# Patient Record
Sex: Female | Born: 1975 | Race: White | Hispanic: No | Marital: Married | Smoking: Never smoker
Health system: Southern US, Community
[De-identification: ages and names within clinical notes are randomized; demographics above are authoritative.]

## PROBLEM LIST (undated history)

## (undated) ENCOUNTER — Emergency Department (HOSPITAL_BASED_OUTPATIENT_CLINIC_OR_DEPARTMENT_OTHER): Admission: EM | Discharge status: Absent without leave | Payer: Self-pay

## (undated) DIAGNOSIS — N809 Endometriosis, unspecified: Secondary | ICD-10-CM

## (undated) DIAGNOSIS — Z87440 Personal history of urinary (tract) infections: Secondary | ICD-10-CM

## (undated) DIAGNOSIS — E039 Hypothyroidism, unspecified: Secondary | ICD-10-CM

## (undated) DIAGNOSIS — Z8619 Personal history of other infectious and parasitic diseases: Secondary | ICD-10-CM

## (undated) DIAGNOSIS — N926 Irregular menstruation, unspecified: Secondary | ICD-10-CM

## (undated) DIAGNOSIS — R102 Pelvic and perineal pain: Secondary | ICD-10-CM

## (undated) DIAGNOSIS — J45909 Unspecified asthma, uncomplicated: Secondary | ICD-10-CM

## (undated) DIAGNOSIS — N930 Postcoital and contact bleeding: Secondary | ICD-10-CM

## (undated) DIAGNOSIS — Z8742 Personal history of other diseases of the female genital tract: Secondary | ICD-10-CM

## (undated) DIAGNOSIS — IMO0002 Reserved for concepts with insufficient information to code with codable children: Secondary | ICD-10-CM

## (undated) DIAGNOSIS — R6882 Decreased libido: Secondary | ICD-10-CM

## (undated) DIAGNOSIS — N83202 Unspecified ovarian cyst, left side: Secondary | ICD-10-CM

## (undated) HISTORY — DX: Endometriosis, unspecified: N80.9

## (undated) HISTORY — DX: Unspecified ovarian cyst, left side: N83.202

## (undated) HISTORY — DX: Personal history of urinary (tract) infections: Z87.440

## (undated) HISTORY — DX: Pelvic and perineal pain: R10.2

## (undated) HISTORY — DX: Personal history of other diseases of the female genital tract: Z87.42

## (undated) HISTORY — PX: WISDOM TOOTH EXTRACTION: SHX21

## (undated) HISTORY — DX: Hypothyroidism, unspecified: E03.9

## (undated) HISTORY — DX: Unspecified asthma, uncomplicated: J45.909

## (undated) HISTORY — DX: Personal history of other infectious and parasitic diseases: Z86.19

## (undated) HISTORY — DX: Decreased libido: R68.82

## (undated) HISTORY — PX: OTHER SURGICAL HISTORY: SHX169

## (undated) HISTORY — PX: TUBAL LIGATION: SHX77

## (undated) HISTORY — DX: Postcoital and contact bleeding: N93.0

## (undated) HISTORY — PX: NOVASURE ABLATION: SHX5394

## (undated) HISTORY — PX: BREAST SURGERY: SHX581

## (undated) HISTORY — DX: Reserved for concepts with insufficient information to code with codable children: IMO0002

## (undated) HISTORY — DX: Irregular menstruation, unspecified: N92.6

---

## 1994-01-03 DIAGNOSIS — IMO0002 Reserved for concepts with insufficient information to code with codable children: Secondary | ICD-10-CM

## 1994-01-03 DIAGNOSIS — R87619 Unspecified abnormal cytological findings in specimens from cervix uteri: Secondary | ICD-10-CM

## 1994-01-03 HISTORY — DX: Reserved for concepts with insufficient information to code with codable children: IMO0002

## 1994-01-03 HISTORY — DX: Unspecified abnormal cytological findings in specimens from cervix uteri: R87.619

## 1998-09-11 ENCOUNTER — Inpatient Hospital Stay (HOSPITAL_COMMUNITY): Admission: AD | Admit: 1998-09-11 | Discharge: 1998-09-11 | Payer: Self-pay | Admitting: Obstetrics and Gynecology

## 1998-11-08 ENCOUNTER — Inpatient Hospital Stay (HOSPITAL_COMMUNITY): Admission: AD | Admit: 1998-11-08 | Discharge: 1998-11-08 | Payer: Self-pay | Admitting: Obstetrics and Gynecology

## 1998-11-17 ENCOUNTER — Inpatient Hospital Stay (HOSPITAL_COMMUNITY): Admission: AD | Admit: 1998-11-17 | Discharge: 1998-11-17 | Payer: Self-pay | Admitting: Obstetrics and Gynecology

## 1998-11-25 ENCOUNTER — Inpatient Hospital Stay (HOSPITAL_COMMUNITY): Admission: AD | Admit: 1998-11-25 | Discharge: 1998-11-27 | Payer: Self-pay | Admitting: Obstetrics and Gynecology

## 2000-01-04 DIAGNOSIS — IMO0002 Reserved for concepts with insufficient information to code with codable children: Secondary | ICD-10-CM

## 2000-01-04 DIAGNOSIS — R6882 Decreased libido: Secondary | ICD-10-CM

## 2000-01-04 DIAGNOSIS — Z8742 Personal history of other diseases of the female genital tract: Secondary | ICD-10-CM

## 2000-01-04 DIAGNOSIS — Z8619 Personal history of other infectious and parasitic diseases: Secondary | ICD-10-CM

## 2000-01-04 HISTORY — DX: Personal history of other infectious and parasitic diseases: Z86.19

## 2000-01-04 HISTORY — DX: Personal history of other diseases of the female genital tract: Z87.42

## 2000-01-04 HISTORY — DX: Decreased libido: R68.82

## 2000-01-04 HISTORY — DX: Reserved for concepts with insufficient information to code with codable children: IMO0002

## 2000-02-23 ENCOUNTER — Other Ambulatory Visit: Admission: RE | Admit: 2000-02-23 | Discharge: 2000-02-23 | Payer: Self-pay | Admitting: Obstetrics and Gynecology

## 2000-02-23 ENCOUNTER — Encounter (INDEPENDENT_AMBULATORY_CARE_PROVIDER_SITE_OTHER): Payer: Self-pay | Admitting: Specialist

## 2000-05-24 ENCOUNTER — Other Ambulatory Visit: Admission: RE | Admit: 2000-05-24 | Discharge: 2000-05-24 | Payer: Self-pay | Admitting: Obstetrics and Gynecology

## 2001-01-03 DIAGNOSIS — N930 Postcoital and contact bleeding: Secondary | ICD-10-CM

## 2001-01-03 DIAGNOSIS — Z8742 Personal history of other diseases of the female genital tract: Secondary | ICD-10-CM

## 2001-01-03 HISTORY — DX: Personal history of other diseases of the female genital tract: Z87.42

## 2001-01-03 HISTORY — DX: Postcoital and contact bleeding: N93.0

## 2001-05-24 ENCOUNTER — Other Ambulatory Visit: Admission: RE | Admit: 2001-05-24 | Discharge: 2001-05-24 | Payer: Self-pay | Admitting: Obstetrics and Gynecology

## 2001-11-09 ENCOUNTER — Encounter: Payer: Self-pay | Admitting: Obstetrics and Gynecology

## 2001-11-09 ENCOUNTER — Ambulatory Visit (HOSPITAL_COMMUNITY): Admission: RE | Admit: 2001-11-09 | Discharge: 2001-11-09 | Payer: Self-pay | Admitting: Obstetrics and Gynecology

## 2001-12-24 ENCOUNTER — Inpatient Hospital Stay (HOSPITAL_COMMUNITY): Admission: AD | Admit: 2001-12-24 | Discharge: 2001-12-24 | Payer: Self-pay | Admitting: Obstetrics and Gynecology

## 2001-12-25 ENCOUNTER — Encounter: Payer: Self-pay | Admitting: Obstetrics and Gynecology

## 2001-12-25 ENCOUNTER — Ambulatory Visit (HOSPITAL_COMMUNITY): Admission: RE | Admit: 2001-12-25 | Discharge: 2001-12-25 | Payer: Self-pay | Admitting: Obstetrics and Gynecology

## 2002-01-03 DIAGNOSIS — N926 Irregular menstruation, unspecified: Secondary | ICD-10-CM

## 2002-01-03 DIAGNOSIS — N809 Endometriosis, unspecified: Secondary | ICD-10-CM

## 2002-01-03 DIAGNOSIS — Z8742 Personal history of other diseases of the female genital tract: Secondary | ICD-10-CM

## 2002-01-03 HISTORY — DX: Irregular menstruation, unspecified: N92.6

## 2002-01-03 HISTORY — DX: Personal history of other diseases of the female genital tract: Z87.42

## 2002-01-03 HISTORY — DX: Endometriosis, unspecified: N80.9

## 2002-03-04 ENCOUNTER — Inpatient Hospital Stay (HOSPITAL_COMMUNITY): Admission: AD | Admit: 2002-03-04 | Discharge: 2002-03-04 | Payer: Self-pay | Admitting: Obstetrics and Gynecology

## 2002-03-14 ENCOUNTER — Inpatient Hospital Stay (HOSPITAL_COMMUNITY): Admission: AD | Admit: 2002-03-14 | Discharge: 2002-03-16 | Payer: Self-pay | Admitting: Obstetrics and Gynecology

## 2002-03-15 ENCOUNTER — Encounter (INDEPENDENT_AMBULATORY_CARE_PROVIDER_SITE_OTHER): Payer: Self-pay

## 2002-06-26 ENCOUNTER — Other Ambulatory Visit: Admission: RE | Admit: 2002-06-26 | Discharge: 2002-06-26 | Payer: Self-pay | Admitting: Obstetrics and Gynecology

## 2003-06-30 ENCOUNTER — Other Ambulatory Visit: Admission: RE | Admit: 2003-06-30 | Discharge: 2003-06-30 | Payer: Self-pay | Admitting: Obstetrics and Gynecology

## 2004-06-29 ENCOUNTER — Ambulatory Visit (HOSPITAL_COMMUNITY): Admission: RE | Admit: 2004-06-29 | Discharge: 2004-06-29 | Payer: Self-pay | Admitting: Obstetrics and Gynecology

## 2004-07-19 ENCOUNTER — Other Ambulatory Visit: Admission: RE | Admit: 2004-07-19 | Discharge: 2004-07-19 | Payer: Self-pay | Admitting: Obstetrics and Gynecology

## 2005-01-03 DIAGNOSIS — R102 Pelvic and perineal pain: Secondary | ICD-10-CM

## 2005-01-03 HISTORY — DX: Pelvic and perineal pain: R10.2

## 2005-05-01 ENCOUNTER — Encounter: Admission: RE | Admit: 2005-05-01 | Discharge: 2005-05-01 | Payer: Self-pay | Admitting: Orthopedic Surgery

## 2005-05-08 ENCOUNTER — Encounter: Admission: RE | Admit: 2005-05-08 | Discharge: 2005-05-08 | Payer: Self-pay | Admitting: Orthopedic Surgery

## 2005-10-18 ENCOUNTER — Other Ambulatory Visit: Admission: RE | Admit: 2005-10-18 | Discharge: 2005-10-18 | Payer: Self-pay | Admitting: Obstetrics and Gynecology

## 2006-01-03 DIAGNOSIS — N83202 Unspecified ovarian cyst, left side: Secondary | ICD-10-CM

## 2006-01-03 HISTORY — DX: Unspecified ovarian cyst, left side: N83.202

## 2006-05-04 ENCOUNTER — Encounter: Admission: RE | Admit: 2006-05-04 | Discharge: 2006-05-04 | Payer: Self-pay | Admitting: Orthopedic Surgery

## 2007-01-04 DIAGNOSIS — Z8742 Personal history of other diseases of the female genital tract: Secondary | ICD-10-CM

## 2007-01-04 HISTORY — DX: Personal history of other diseases of the female genital tract: Z87.42

## 2007-08-16 ENCOUNTER — Emergency Department (HOSPITAL_COMMUNITY): Admission: EM | Admit: 2007-08-16 | Discharge: 2007-08-17 | Payer: Self-pay | Admitting: Emergency Medicine

## 2007-08-23 ENCOUNTER — Emergency Department (HOSPITAL_COMMUNITY): Admission: EM | Admit: 2007-08-23 | Discharge: 2007-08-23 | Payer: Self-pay | Admitting: *Deleted

## 2010-05-21 NOTE — H&P (Signed)
NAME:  [name], [name]                         ACCOUNT NO.:  [id number]   MEDICAL RECORD NO.:  [id number]                   PATIENT TYPE:  INP   LOCATION:  9173                                 [facility]ILITY:  [facility]   PHYSICIAN:  Hal Morales, M.D.             DATE OF BIRTH:  1975/02/13   DATE OF ADMISSION:  03/14/2002  DATE OF DISCHARGE:                                HISTORY & PHYSICAL   HISTORY OF PRESENT ILLNESS:  The patient is a 35 year old, G3, P2-0-0-2 who  is admitted for induction secondary to suspected macrosomia.  The patient  reports estimated fetal weight approximately eight to nine pounds.  The  pregnancy has been remarkable for history of oligohydramnios last pregnancy,  asthma, questionable LMP, obesity, history of positive Group B Streptococcus  previous pregnancy and desires tubal sterilization.   PRENATAL LABORATORY DATA:  Blood type B positive, Rh antibody negative, VDRL  nonreactive, rubella titer positive, hepatitis B surface antigen negative,  HIV nonreactive.  Cystic fibrosis testing was negative.  Pap smear was  normal.  GC and Chlamydia cultures were negative.  Glucose challenge was  normal.  AFP was normal.  Hemoglobin upon entering the practice was 11.9.  It was 11 at 27 weeks.  Group B Streptococcus was negative at 36 weeks.  EDC  of March 14, 2002, was established by ultrasound at approximately 10 weeks  secondary to questionable LMP.   PRENATAL COURSE:  The patient entered care at approximately 10 weeks.  She  had a significant rash that developed over her trunk at approximately 11  weeks.  She was given steroids by her primary physician.  She did have some  weight loss in early pregnancy.  She had an ultrasound at 18 weeks that  showed normal growth and fluid.  Limited anatomy was noted.  Follow up  ultrasound was done at [facility] still with the same limitations of  anatomy.  She was referred to a dermatologist at approximately 18 weeks.  She  was seen by her primary and was evaluated for her asthma at  approximately 26 weeks.  She was evaluated at maternity admissions at 28  weeks for preterm labor.  Fetal fibronectin was negative.  She had an  ultrasound at approximately 34 weeks, again showing slight macrosomia.  She  had an ultrasound again at 36 weeks purely for position.  The rest of the  pregnancy was essentially uncomplicated other than the suspected macrosomia  with estimated fetal weight, per patient report, of eight to nine pounds.   PAST OBSTETRICAL HISTORY:  In 1998, she had a vaginal birth of a female  infant, weight 6 pounds 13 ounces at 40 weeks' gestation.  She was in labor  for 10 hours.  She had an epidural.  In 2000, she had a vaginal birth of a  female infant weight 7 pounds 5 ounces at 39 weeks.  She was in labor  for  eight hours with an epidural.  She had no problem with her labors.   PAST MEDICAL HISTORY:  1. History of endometriosis diagnosed on laparoscopic procedure for usual     childhood illnesses.  2. Asthma and uses albuterol p.r.n.   PAST SURGICAL HISTORY:  1. Wisdom teeth removed.  2. In 1991, she had right breast axillary tissue removed.  3. In 1997, laparoscopy noted endometriosis.   ALLERGIES:  SULFA which causes a rash.   FAMILY HISTORY:  Maternal grandfather had myocardial infarction.  Brother  has a heart murmur.  Father has hypertension.  Mother and maternal  grandfather have diabetes.  Maternal grandfather had chronic renal disease.  Maternal grandmother with arthritis.  Maternal great-grandmother had breast  and liver cancer.  Mother and maternal grandmother have depression.   GENETIC HISTORY:  Unremarkable except for the father of the baby's first  cousin being mentally retarded.   SOCIAL HISTORY:  The patient is married to the father of the baby.  He is  involved and supportive.  His name is Sharin Grave.  The patient is  Caucasian of the [place].  She has been  followed by the physician  service at [facility].  She denies any alcohol, drug or tobacco use  during this pregnancy.   PHYSICAL EXAMINATION:  VITAL SIGNS:  Stable.  The patient is afebrile.  HEENT:  Within normal limits.  LUNGS:  Breath sounds are clear.  HEART:  Regular rate and rhythm without murmur.  BREASTS:  Soft and nontender.  ABDOMEN:  Fundal height approximately 40 cm.  Vertex by Leopold's.  Fetal  heart rate is reassuring with no decelerations.  Now has accelerations with  very sporadic uterine contractions noted.  PELVIC:  Cervical exam per last office visit was 1 cm, 50%.  EXTREMITIES:  Within normal limits.   IMPRESSION:  1. Intrauterine pregnancy at term.  2. Suspected macrosomia.  3. Desires tubal sterilization.   PLAN:  1. Admit to birthing suite for consult with Dr. Pennie Rushing as attending     physician.  2. Routine physician orders.  3. Dr. Pennie Rushing will determine mode of induction.     Renaldo Reel Emilee Hero, C.N.M.                   Hal Morales, M.D.    VLL/MEDQ  D:  03/14/2002  T:  03/14/2002  Job:  [id number]

## 2010-05-21 NOTE — Op Note (Signed)
[name], [name]               ACCOUNT NO.:  [id number]   MEDICAL RECORD NO.:  [id number]          PATIENT TYPE:  AMB   LOCATION:  SDC                           [facility]  [facility]   PHYSICIAN:  Hal Morales, M.D.DATE OF BIRTH:  [birth year]   DATE OF PROCEDURE:  06/29/2004  DATE OF DISCHARGE:                                 OPERATIVE REPORT   PREOPERATIVE DIAGNOSIS:  Menorrhagia.   POSTOPERATIVE DIAGNOSIS:  Menorrhagia.   OPERATION:  NovaSure endometrial ablation.   SURGEON:  Hal Morales, M.D.   ANESTHESIA:  General.   ESTIMATED BLOOD LOSS:  Less than 10 mL.   COMPLICATIONS:  None.   FINDINGS:  The uterus sounded to a total of 9 cm with the cervix sounding to  3 cm. This left the cavity length of 6 cm.   PROCEDURE:  The patient was taken to the operating room after appropriate  identification and placed on the operating table. After the attainment of  adequate general anesthesia she was placed in modified lithotomy position.  The perineum was prepped with multiple layers of Betadine as was the vagina.  The patient had gone to empty her bladder just prior to coming to the  operating room. The perineum was draped as a sterile field. A Graves  speculum was placed in the vagina after an examination under anesthesia. A  single-tooth tenaculum was placed on the anterior cervix. The cervical  length was measured at 3 cm. The total uterine length was measured at 9 cm.  The NovaSure apparatus was then placed at the 6 cm depth and the cervix  dilated to a #23 dilator. The NovaSure apparatus was then placed into the  endometrial cavity and seated with an increase in the cavity width from 2cm  to total of 4 cm after seating was complete. The collar was then moved to  allow a  seal on the cervix and the integrity test performed. After passing  the integrity test, the NovaSure ablation was begun and took a total of 34  seconds with a power of 132. Once the ablation was complete  the array was  replaced within the NovaSure apparatus and the entire apparatus was removed  from the endometrial cavity. The tenaculum was removed and a Nyman nitrate  stick used to cauterize an area on the cervix which was bleeding from the  tenaculum. All instruments were removed from the vagina and the patient was  awakened from general anesthesia, taken to the recovery room in satisfactory  condition having tolerated the procedure well with sponge and instrument  counts correct.     VPH/MEDQ  D:  06/29/2004  T:  06/29/2004  Job:  [id number]

## 2010-05-21 NOTE — Op Note (Signed)
   [name], [name]                         ACCOUNT NO.:  [id number]   MEDICAL RECORD NO.:  [id number]                   PATIENT TYPE:  INP   LOCATION:  9111                                 [facility]   PHYSICIAN:  Crist Fat. Rivard, M.D.              DATE OF BIRTH:  11/08/1975   DATE OF PROCEDURE:  DATE OF DISCHARGE:                                 OPERATIVE REPORT   PREOPERATIVE DIAGNOSES:  Desire for sterilization.   POSTOPERATIVE DIAGNOSES:  Desire for sterilization.   ANESTHESIA:  Epidural.   PROCEDURE:  Postpartum bilateral tubal sterilization.   SURGEON:  Crist Fat. Rivard, M.D.   ESTIMATED BLOOD LOSS:  Minimal.   PROCEDURE:  After being informed of the planned procedure with possible  complications including bleeding, infection, irreversibility, and failure  rate of 1 in 500, consent was obtained.  The patient was taken to OR number  three and preplaced epidural catheter was reused for epidural anesthesia.  After assessing adequate level of anesthesia, we proceeded with local  infiltration of the umbilical area using 10 mL of Marcaine 0.25 and  performed a semi elliptical incision which was brought down bluntly to the  fascia.  The fascia was grasped with two Allis forceps, incised with  scissors, and the peritoneum was entered bluntly.  We were able to easily  locate both tubes which were grasped with Babcock forceps and identified  through the fimbrial end easily.  The mesosalpinx was entered using cautery.  Tubes were doubly ligated on each end of the stump.  A length of 1.5 cm was  removed and both stumps were cauterized.  Hemostasis was assessed and  adequate.  Fascia was then closed with a running suture of 0 Vicryl.  Skin  was closed with a subcuticular suture of 4-0 Monocryl and Steri-Strips.   Instruments and sponge count is complete x2.  Estimated blood loss is  minimal.  Procedure well tolerated by the patient who is taken to recovery  room in a well  and stable condition.                                               Crist Fat Rivard, M.D.    SAR/MEDQ  D:  03/15/2002  T:  03/15/2002  Job:  [id number]

## 2010-05-21 NOTE — Discharge Summary (Signed)
[name], [name]                         ACCOUNT NO.:  [id number]   MEDICAL RECORD NO.:  [id number]                   PATIENT TYPE:  INP   LOCATION:  9111                                 [facility]   PHYSICIAN:  Crist Fat. Rivard, M.D.              DATE OF BIRTH:  December 27, 1975   DATE OF ADMISSION:  03/14/2002  DATE OF DISCHARGE:  03/16/2002                                 DISCHARGE SUMMARY   ADMISSION DIAGNOSES:  1. Intrauterine pregnancy at 40 weeks.  2. Questionable large for gestational age.  3. History of asthma.  4. Obesity.  5. Desires tubal sterilization.   DISCHARGE DIAGNOSES:  1. Term intrauterine pregnancy.  2. Nonreassuring fetal heart rate.   PROCEDURES:  1. Vacuum-assisted vaginal delivery.  2. Postpartum bilateral tubal sterilization.  3. Epidural anesthesia.   HOSPITAL COURSE:  The patient is a 35 year old gravida 3, para 2-0-0-2 who  was admitted for elective induction secondary to suspected macrosomia.  The  patient had requested induction for suspected macrosomia.  Risks and  benefits were reviewed with the patient and she did wish to proceed.  The  pregnancy had been remarkable for (1) History of oligohydramnios  last  pregnancy, (2) asthma, (3) questionable LMP, (4) obesity, (5) history of  positive group B strep in a previous pregnancy, (6) desires tubal  sterilization.  The patient was admitted on the morning of March 14, 2002  and was begun on Pitocin.  Cervix at that time was 1 cm, 50%, vertex at a  minus 2 station.  The patient progressed slowly and was complete at 9:13.  She did have some sporadic variable decelerations but with quick return  which when she began pushing fetal heart rate decreased to 60 with a slow  return to baseline.  O2 was placed.  Vacuum-assisted vaginal delivery was  recommended since the fetus was in a plus 2 station at that time.  The  patient did wish to proceed with this.  This was performed by Dr. Dierdre Forth.  A vacuum-assisted vaginal delivery was accomplished easily.  Findings were a viable female by the name of Zoe, weight 7 pounds 3 ounces,  Apgars were 6 and 8.  Infant was taken to the full-term nursery, mother was  taken to recovery in good condition.  On postpartum day 1, the patient had a  bilateral tubal ligation under the previous existing epidural anesthesia.  She tolerated the procedure well.  This was done by Dr. Dois Davenport Rivard.  Estimated blood loss was minimal.  The patient was returned to routine  postpartum care.  By postpartum day #2, postop day #1, on March 16, 2002 the  patient was doing well.  She was up ad lib and tolerating a regular diet.  Her incision was clean, dry, and intact.  Her fundus was firm.  Her bleeding  was within normal limits.  She was deemed to have  received the full benefit  of her hospital stay and was discharged home.   DISCHARGE INSTRUCTIONS:  Per [facility] handout.    DISCHARGE MEDICATIONS:  1. Motrin 600 mg p.o. q.6 h. p.r.n. pain.  2  Tylox 1-2 p.o. q.3-4 h. p.r.n. pain.   FOLLOW UP:  Discharge follow up will occur in 6 weeks at [facility].     Renaldo Reel Emilee Hero, C.N.M.                   Crist Fat Rivard, M.D.    Leeanne Mannan  D:  03/16/2002  T:  03/17/2002  Job:  [id number]

## 2010-05-21 NOTE — H&P (Signed)
NAMECHANTELL, [name]               ACCOUNT NO.:  [id number]   MEDICAL RECORD NO.:  [id number]          PATIENT TYPE:  AMB   LOCATION:  SDC                           [facility]   PHYSICIAN:  Hal Morales, M.D.DATE OF BIRTH:  [birth year]   DATE OF ADMISSION:  DATE OF DISCHARGE:                                HISTORY & PHYSICAL   HISTORY OF PRESENT ILLNESS:  [name] is a 35 year old married white  female who is status post bilateral tubal ligation para 3-0-0-3 who presents  for NovaSure endometrial ablation procedure because of menorrhagia. For  approximately 1 year the patient has had a 7- to 10-day menstrual flow  characterized by large clots which she passes three or more times per hour.  Since the patient rushes to deposit these clots in the commode, her pad  change is limited to four to five times a day. She reports minimal cramping  and denies any nausea, vomiting, diarrhea, fever, vaginitis symptoms, or  urinary tract symptoms. The patient does have hypothyroidism; however, her  most recent thyroid panel in January 2006 was normal. A sonohysterogram on  May 26, 2004 was also normal without any evidence of endometrial lesions.  Since the patient has had her tubes tied, she feels strongly that she does  not want to consider birth control pills as a management option for her  symptoms. Subsequently, she has decided to proceed with the endometrial  ablation using the NovaSure system.   PAST MEDICAL HISTORY:  Obstetric history:  Gravida 3 para 3-0-0-3.   GYN history:  Menarche 35 years old. Last menstrual period June 03, 2004.  Menstrual flow:  See history of present illness. The patient uses bilateral  tubal ligation as her method of contraception. The patient has a remote  history of an abnormal Pap smear on one occasion which was repeated and has  been normal since that time. She denies any history of sexually-transmitted  diseases. Her last normal Pap smear was June  2004.   Medical history:  1.  Asthma.  2.  Hypothyroidism.  3.  Pneumonia.  4.  Endometriosis.  5.  Migraines.  6.  Depression.   Surgical history:  1.  In 1991, excision of right axillary breast tissue.  2.  In 1997, laparoscopy, diagnosed with endometriosis.  3.  In 2002, vaginal biopsy for a benign polyp.  4.  In 2004, bilateral tubal ligation.   Denies any history of blood transfusions. The patient does admit to being  slow to awaken from anesthesia.   FAMILY HISTORY:  Diabetes mellitus, thyroid disease, breast and liver  cancer, stroke, cardiovascular disease, end-stage renal disease, and  depression.   SOCIAL HISTORY:  The patient is married and she works for [employer].   HABITS:  The patient does not use alcohol or tobacco.   MEDICATIONS:  1.  Bupropion 150 mg one tablet twice daily.  2.  Synthroid 50 mcg one tablet every-other day.  3.  Synthroid 75 mcg one tablet every-other day.  4.  Albuterol inhaler as needed.   ALLERGIES:  SULFA causes intense itching.  REVIEW OF SYSTEMS:  The patient does wear glasses and contact lenses. All  other systems are negative except as mentioned in history of present  illness.   PHYSICAL EXAMINATION:  VITAL SIGNS:  Blood pressure 100/70, weight is 196.5,  height is 5 feet 1 inch tall.  NECK:  Supple without masses. There is no thyromegaly or adenopathy.  HEART:  Regular rate and rhythm. There is no murmur.  LUNGS:  Clear to auscultation. There are no wheezes, rales or rhonchi.  BACK:  No CVA tenderness.  ABDOMEN:  Bowel sounds are present. It is soft without tenderness, guarding,  rebound, or organomegaly.  EXTREMITIES:  Without clubbing, cyanosis, or edema.  PELVIC:  EG/BUS is within normal limits. The vagina is rugous. Cervix is  nontender without lesion. Uterus is normal size, shape, and consistency  without tenderness. Adnexa without tenderness or masses. Rectovaginal exam  without tenderness or masses.    IMPRESSION:  Menorrhagia.   DISPOSITION:  A discussion was held with the patient regarding the  indications for the NovaSure endometrial ablation procedure, along with its  risks which include but are not limited to:  Reaction to anesthesia, damage  to adjacent organs, infection, and excessive bleeding. The patient has  consented to proceed with the NovaSure procedure on June 29, 2004 at [facility] [place] at 10:45 a.m.       EJP/MEDQ  D:  06/24/2004  T:  06/24/2004  Job:  [id number]

## 2010-05-21 NOTE — Op Note (Signed)
[name], [name]                         ACCOUNT NO.:  [id number]   MEDICAL RECORD NO.:  [id number]                   PATIENT TYPE:  INP   LOCATION:  9111                                 [facility]   PHYSICIAN:  Hal Morales, M.D.             DATE OF BIRTH:  [birth year]   DATE OF PROCEDURE:  03/14/2002  DATE OF DISCHARGE:                                 OPERATIVE REPORT   PREOPERATIVE DIAGNOSES:  1. Intrauterine pregnancy at term.  2. Nonreassuring fetal heart rate tracing.   POSTOPERATIVE DIAGNOSES:  1. Intrauterine pregnancy at term.  2. Nonreassuring fetal heart rate tracing.  3. Tight nuchal cord x1.   PROCEDURE:  Vacuum-assisted vaginal delivery.   SURGEON:  Hal Morales, M.D.   ANESTHESIA:  Epidural.   ESTIMATED BLOOD LOSS:  Less than 500 mL.   COMPLICATIONS:  None.   FINDINGS:  The patient was delivered of a female infant whose name is Zoe,  whose weight was pending at the time of dictation, with Apgars of 6 and 8 at  one and five minutes, respectively.   DESCRIPTION OF PROCEDURE:  The patient had been pushing for a short period  of time with the vertex at +2 and OA.  She had recently had readjustment of  her epidural, and her pushing efforts were less than maximal.  Just prior to  beginning pushing the fetal heart rate showed variable decelerations with  each contraction down to a nadir in the 70s but with rapid return.  Once the  patient started pushing, she had heart rates down to a nadir of 60 with very  slow return to a baseline of around 100.  After several pushes, it was clear  that the second stage would not be rapid and a discussion was held with the  patient concerning a recommendation for vacuum-assisted vaginal delivery.  The risks of vacuum-assisted delivery were discussed and the patient  consented.  The Kiwi vacuum was then placed over the fetal vertex, the Foley  catheter having only recently been discontinued.  With the next  contraction,  the fetal vertex was delivered over an intact perineum.  The nares and  pharynx were suctioned and the tight nuchal cord was divided between two  Kelly clamps.  The remainder of the infant was delivered with a combination  of maternal expulsive efforts and gentle traction.  The infant was handed  off to the awaiting pediatricians.  The appropriate cord blood was drawn and  the placenta spontaneously delivered with gentle traction.  No lacerations  were noted.  The patient tolerated the procedure well.  The infant was  subsequently taken to the full-term nursery.  Hal Morales, M.D.   VPH/MEDQ  D:  03/14/2002  T:  03/15/2002  Job:  [id number]

## 2010-05-21 NOTE — H&P (Signed)
[facility]  Patient:    [name]                       MRN: [id number] Adm. Date:  [id number] Attending:  Leonard Schwartz Dictator:   Mack Guise, C.N.M.                         History and Physical  HISTORY OF PRESENT ILLNESS:   Ms. [name] is a 35 year old gravida 2, para 1-0-0-1 at 39-1/7 weeks, EDD December 01, 1998.  She presents with contractions becoming regular at approximately 5 a.m.  She reports positive fetal movement, no bleeding, no fluid leaking from the vagina.  Her pregnancy has been followed by the M.D. service at [facility] and is remarkable for:  #1 - Unknown LMP, #2 - history of oligohydramnios with her first pregnancy, #3 - history of asthma, #4 - positive  group B strep.  PRENATAL LABORATORY DATA:     At 28 weeks, one-hour glucola 111 and hemoglobin 11.4.  At 36 weeks, culture of the vaginal tract is positive for group B strep.   HISTORY OF PRESENT PREGNANCY:  This patient was initially seen at the office of  [facility] on April 01, 1998 at 5-weeks gestation.  Her pregnancy has been followed by the M.D. service at [facility] and has been essentially uncomplicated.  Her EDD was confirmed by early pregnancy ultrasonography and confirmed by followup. Throughout this patients prenatal course, the growth of the uterine fundus has been noted o be compatible in size with her dates.  Systolic blood pressure has ranged from 00 to 112 and diastolic blood pressure has ranged from 58 to 70.  There has been no significant proteinuria.  OBSTETRICAL HISTORY:          1. May 1998, normal spontaneous vaginal delivery ith the birth of a 6-pound 13-ounce female.  Patient had oligohydramnios with that pregnancy.  Her labor was induced and there were no complications with delivery.                               2. The present pregnancy.  MEDICAL HISTORY:              Patient has a history of asthma.  Her ankles were  injured as a  teenager.  SURGICAL HISTORY:             Wisdom teeth; 1997, laparoscopy; 1991, right breast/axillary mass removed, benign.  FAMILY HISTORY:               Brother with a heart murmur.  Maternal grandfather with a history of MI.  Mother, father and maternal grandfather with a history of asthma.  Maternal grandfather -- history of diabetes.  Mother -- diabetes. Maternal grandmother -- arthritis.  Maternal grandmother -- breast and liver cancer.  Mother and maternal grandmother with a history of depression.  GENETIC HISTORY:              Father of the baby has a first cousin with a history of mental retardation; otherwise, there is no family history of familial or genetic disorders, children that died in infancy or that were born with birth defects.  MEDICATIONS:                  Albuterol inhaler p.r.n.; prenatal vitamins.  ALLERGIES:  SULFA causes a rash.  HABITS:                       Patient denies the use of tobacco, alcohol or illicit drugs.  SOCIAL HISTORY:               [name] is a 35 year old married Caucasian female. 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  Her husband is Systems analyst.  He is college-educated and works as a Curator.  They are of the [place].  PHYSICAL EXAMINATION:  VITAL SIGNS:                  Stable.  Afebrile.  HEENT:                        Unremarkable.  LUNGS:                        Clear to auscultation bilaterally.  HEART:                        Regular in its rate and rhythm.  ABDOMEN:                      Gravid in its contour.  Uterine fundus is noted to extend 39 cm above the level of the pubic symphysis.  Leopold maneuver finds the infant to be in a longitudinal lie, cephalic presentation and an estimated fetal weight of 7-1/2 pounds.  ASSESSMENT:                   1. Intrauterine pregnancy at term.                               2. Active labor.  PLAN:                          1. Admit to birthing suite per                                  Janine Limbo, M.D.                               2. Routine M.D. orders.                               3. Positive group B strep prophylaxis. DD:  11/25/98 TD:  11/25/98 Job: [id number]

## 2010-10-01 LAB — BASIC METABOLIC PANEL
BUN: 14
CO2: 24
Calcium: 8.9
Chloride: 106
Creatinine, Ser: 0.8
GFR calc Af Amer: >60
GFR calc non Af Amer: >60
Glucose, Bld: 100 — ABNORMAL HIGH
Potassium: 3.9
Sodium: 137

## 2010-10-01 LAB — HEPATIC FUNCTION PANEL
ALT: 22
AST: 22
Albumin: 3.7
Alkaline Phosphatase: 84
Bilirubin, Direct: 0.2
Indirect Bilirubin: 0.4
Total Bilirubin: 0.6
Total Protein: 7.6

## 2010-10-01 LAB — DIFFERENTIAL
Basophils Absolute: 0
Basophils Relative: 0
Eosinophils Absolute: 0.5
Eosinophils Relative: 8 — ABNORMAL HIGH
Lymphocytes Relative: 15
Lymphs Abs: 1
Monocytes Absolute: 0.5
Monocytes Relative: 8
Neutro Abs: 4.7
Neutrophils Relative %: 69

## 2010-10-01 LAB — URINALYSIS, ROUTINE W REFLEX MICROSCOPIC
Bilirubin Urine: NEGATIVE
Glucose, UA: NEGATIVE
Ketones, ur: NEGATIVE
Leukocytes, UA: NEGATIVE
Nitrite: NEGATIVE
Protein, ur: NEGATIVE
Specific Gravity, Urine: 1.031 — ABNORMAL HIGH
Urobilinogen, UA: 0.2
pH: 5.5

## 2010-10-01 LAB — CBC
HCT: 38.6
Hemoglobin: 12.9
MCHC: 33.4
MCV: 84.4
Platelets: 283
RBC: 4.57
RDW: 13.4
WBC: 6.8

## 2010-10-01 LAB — URINE MICROSCOPIC-ADD ON

## 2010-10-01 LAB — HCG, QUANTITATIVE, PREGNANCY: hCG, Beta Chain, Quant, S: <2

## 2010-10-01 LAB — PREGNANCY, URINE: Preg Test, Ur: NEGATIVE

## 2011-08-18 ENCOUNTER — Ambulatory Visit (INDEPENDENT_AMBULATORY_CARE_PROVIDER_SITE_OTHER): Payer: BC Managed Care – PPO | Admitting: Obstetrics and Gynecology

## 2011-08-18 ENCOUNTER — Encounter: Payer: Self-pay | Admitting: Obstetrics and Gynecology

## 2011-08-18 VITALS — BP 112/62 | Ht 61.0 in | Wt 220.0 lb

## 2011-08-18 DIAGNOSIS — N809 Endometriosis, unspecified: Secondary | ICD-10-CM

## 2011-08-18 NOTE — Progress Notes (Signed)
Subjective:    [name] is a 36 y.o. female, G3P3003, who presents for an annual exam. Pt has hx of endometriosis but is without symptoms.  She has had an endometrial ablation and is amenorrheic   Last Pap: 07/21/2009 WNL: Yes Regular Periods:no Ablation Contraception: BTL  Monthly Breast exam:yes Tetanus<47yrs:unsure Nl.Bladder Function:yes Daily BMs:yes Healthy Diet:yes Calcium:no Mammogram:no Date of Mammogram: n/a Exercise:yes Have often Exercise: occasional  Seatbelt: yes Abuse at home: no Stressful work:no Sigmoid-colonoscopy: n/a Bone Density: Yes 2013 PCP: [place] Change in PMH: none Change in [facility]: none    History   Social History  . Marital Status: Married    Spouse Name: N/A    Number of Children: N/A  . Years of Education: N/A   Social History Main Topics  . Smoking status: Never Smoker   . Smokeless tobacco: Never Used  . Alcohol Use: No  . Drug Use: No  . Sexually Active: Yes    Birth Control/ Protection: Surgical     BTL   Other Topics Concern  . None   Social History Narrative  . None    Menstrual cycle:   LMP: No LMP recorded. Patient has had an ablation.           Cycle:small amt spotting last June  The following portions of the patient's history were reviewed and updated as appropriate: allergies, current medications, past family history, past medical history, past social history, past surgical history and problem list.  Review of Systems Pertinent items are noted in HPI. Breast:Negative for breast lump,nipple discharge or nipple retraction Gastrointestinal: Negative for abdominal pain, change in bowel habits or rectal bleeding Urinary:negative   Objective:    BP 112/62  Ht 5\' 1"  (1.549 m)  Wt 220 lb (99.791 kg)  BMI 41.57 kg/m2    Weight:  Wt Readings from Last 1 Encounters:  08/18/11 220 lb (99.791 kg)          BMI: Body mass index is 41.57 kg/(m^2).  General Appearance: Alert, appropriate appearance for age. No acute  distress HEENT: Grossly normal Neck / Thyroid: Supple, no masses, nodes or enlargement Lungs: clear to auscultation bilaterally Back: No CVA tenderness Breast Exam: No masses or nodes.No dimpling, nipple retraction or discharge. Cardiovascular: Regular rate and rhythm. S1, S2, no murmur Gastrointestinal: Soft, non-tender, no masses or organomegaly Pelvic Exam: Vulva and vagina appear normal. Bimanual exam reveals normal uterus and adnexa. Exam limited by body habitus Rectovaginal: normal rectal, no masses Lymphatic Exam: Non-palpable nodes in neck, clavicular, axillary, or inguinal regions Skin: no rash or abnormalities Neurologic: Normal gait and speech, no tremor  Psychiatric: Alert and oriented, appropriate affect.   Wet Prep:not applicable Urinalysis:not applicable UPT: Not done   Assessment:    Normal gyn exam hx endometriosis    Plan:    pap smear due 2014 return annually or prn STD screening: declined Contraception:bilateral tubal ligation

## 2012-01-19 ENCOUNTER — Encounter (HOSPITAL_BASED_OUTPATIENT_CLINIC_OR_DEPARTMENT_OTHER): Payer: Self-pay | Admitting: *Deleted

## 2012-01-19 ENCOUNTER — Emergency Department (HOSPITAL_BASED_OUTPATIENT_CLINIC_OR_DEPARTMENT_OTHER)
Admission: EM | Admit: 2012-01-19 | Discharge: 2012-01-19 | Disposition: A | Discharge status: Home / Self Care | Payer: BC Managed Care – PPO | Attending: Emergency Medicine | Admitting: Emergency Medicine

## 2012-01-19 DIAGNOSIS — E039 Hypothyroidism, unspecified: Secondary | ICD-10-CM | POA: Insufficient documentation

## 2012-01-19 DIAGNOSIS — Z8742 Personal history of other diseases of the female genital tract: Secondary | ICD-10-CM | POA: Insufficient documentation

## 2012-01-19 DIAGNOSIS — Z87440 Personal history of urinary (tract) infections: Secondary | ICD-10-CM | POA: Insufficient documentation

## 2012-01-19 DIAGNOSIS — Z79899 Other long term (current) drug therapy: Secondary | ICD-10-CM | POA: Insufficient documentation

## 2012-01-19 DIAGNOSIS — K5289 Other specified noninfective gastroenteritis and colitis: Secondary | ICD-10-CM | POA: Insufficient documentation

## 2012-01-19 DIAGNOSIS — K529 Noninfective gastroenteritis and colitis, unspecified: Secondary | ICD-10-CM

## 2012-01-19 DIAGNOSIS — Z8619 Personal history of other infectious and parasitic diseases: Secondary | ICD-10-CM | POA: Insufficient documentation

## 2012-01-19 DIAGNOSIS — J45909 Unspecified asthma, uncomplicated: Secondary | ICD-10-CM | POA: Insufficient documentation

## 2012-01-19 LAB — URINALYSIS, ROUTINE W REFLEX MICROSCOPIC
Nitrite: NEGATIVE
Specific Gravity, Urine: 1.027 (ref 1.005–1.030)
pH: 5 (ref 5.0–8.0)

## 2012-01-19 LAB — COMPREHENSIVE METABOLIC PANEL
AST: 46 U/L — ABNORMAL HIGH (ref 0–37)
Albumin: 4.1 g/dL (ref 3.5–5.2)
BUN: 15 mg/dL (ref 6–23)
Calcium: 9.4 mg/dL (ref 8.4–10.5)
Creatinine, Ser: 1 mg/dL (ref 0.50–1.10)
GFR calc non Af Amer: 71 mL/min — ABNORMAL LOW (ref >90)

## 2012-01-19 LAB — CBC WITH DIFFERENTIAL/PLATELET
Basophils Absolute: 0 10*3/uL (ref 0.0–0.1)
Basophils Relative: 0 % (ref 0–1)
Eosinophils Relative: 4 % (ref 0–5)
HCT: 43.3 % (ref 36.0–46.0)
MCH: 26.7 pg (ref 26.0–34.0)
MCHC: 32.1 g/dL (ref 30.0–36.0)
MCV: 83.1 fL (ref 78.0–100.0)
Monocytes Absolute: 0.4 10*3/uL (ref 0.1–1.0)
RDW: 14.7 % (ref 11.5–15.5)

## 2012-01-19 LAB — URINE MICROSCOPIC-ADD ON

## 2012-01-19 MED ORDER — ONDANSETRON HCL 4 MG/2ML IJ SOLN
4.0000 mg | Freq: Once | INTRAMUSCULAR | Status: AC
  Administered 2012-01-19: 4 mg via INTRAVENOUS
  Filled 2012-01-19: qty 2

## 2012-01-19 MED ORDER — ACETAMINOPHEN 325 MG PO TABS
650.0000 mg | ORAL_TABLET | Freq: Once | ORAL | Status: AC
  Administered 2012-01-19: 650 mg via ORAL
  Filled 2012-01-19: qty 2

## 2012-01-19 MED ORDER — DICYCLOMINE HCL 20 MG PO TABS: 20.0000 mg | ORAL_TABLET | Freq: Two times a day (BID) | ORAL | Status: DC | PRN

## 2012-01-19 MED ORDER — SODIUM CHLORIDE 0.9 % IV BOLUS (SEPSIS)
2000.0000 mL | Freq: Once | INTRAVENOUS | Status: AC
  Administered 2012-01-19: 2000 mL via INTRAVENOUS

## 2012-01-19 MED ORDER — LOPERAMIDE HCL 2 MG PO CAPS: 2.0000 mg | ORAL_CAPSULE | Freq: Four times a day (QID) | ORAL | Status: DC | PRN

## 2012-01-19 NOTE — ED Notes (Signed)
Vomiting and diarrhea x 4 days. Abdominal pain. Has been taking Phenergan. Family is sick with GI bug.

## 2012-01-19 NOTE — ED Provider Notes (Signed)
History     CSN: [id number]  Arrival date & time 01/19/12  1258   First MD Initiated Contact with Patient 01/19/12 [phone]      Chief Complaint  Patient presents with  . Emesis    (Consider location/radiation/quality/duration/timing/severity/associated sxs/prior treatment) HPI Pt reports about 4 days of vomiting and diarrhea. Several different family members have had similar symptoms and tested positive for norovirus. Her symptoms have primarily been watery, but non bloody diarrhea recently, no vomiting in the last 2 days, but she has had decreased appetite, little PO intake. She has not had any fever.  Past Medical History  Diagnosis Date  . Abnormal Pap smear 1996  . H/O candidiasis   . H/O varicella   . Hx: UTI (urinary tract infection)   . Asthma   . Dyspareunia 2002  . Libido, decreased 2002  . H/O tinea cruris 2002  . H/O vulvovaginitis 2002  . H/O cervicitis 2002  . PCB (post coital bleeding) 2003  . H/O dysmenorrhea 2004  . H/O: menorrhagia 2003  . Endometriosis 2004  . Hypothyroidism   . Menses, irregular 2004  . Pelvic pain in female 2007  . Ovarian cyst, left 2008  . H/O amenorrhea 2009    Past Surgical History  Procedure Date  . Wisdom tooth extraction [id number]  . Breast surgery   . Novasure ablation   . Tubal ligation     Family History  Problem Relation Age of Onset  . Asthma Mother   . Diabetes Mother   . Depression Mother   . Asthma Father   . Heart disease Brother     Heart Murmur  . Arthritis Maternal Grandmother   . Cancer Maternal Grandmother     Breast & liver  . Depression Maternal Grandmother   . Heart disease Maternal Grandfather     MI  . Asthma Maternal Grandfather   . Diabetes Maternal Grandfather   . Kidney disease Maternal Grandfather     Dialysis    History  Substance Use Topics  . Smoking status: Never Smoker   . Smokeless tobacco: Never Used  . Alcohol Use: No    OB History    Grav Para Term Preterm Abortions  TAB SAB Ect Mult Living   3 3 3       3       Review of Systems All other systems reviewed and are negative except as noted in HPI.   Allergies  Sulfa drugs cross reactors  Home Medications   Current Outpatient Rx  Name  Route  Sig  Dispense  Refill  . BUPROPION HCL ER (XL) 300 MG PO TB24   Oral   Take 300 mg by mouth daily.         . CELEXA PO   Oral   Take by mouth.         Marland Kitchen LEVOTHYROXINE SODIUM 125 MCG PO TABS   Oral   Take 125 mcg by mouth daily.         Marland Kitchen OMEPRAZOLE 40 MG PO CPDR   Oral   Take 40 mg by mouth daily.           BP 125/79  Pulse 108  Temp 98.1 F (36.7 C) (Oral)  Resp 20  Ht 5\' 1"  (1.549 m)  Wt 219 lb (99.338 kg)  BMI 41.38 kg/m2  SpO2 99%  Physical Exam  Nursing note and vitals reviewed. Constitutional: She is oriented to person, place, and time. She appears well-developed and  well-nourished.  HENT:  Head: Normocephalic and atraumatic.       Dry membranes  Eyes: EOM are normal. Pupils are equal, round, and reactive to light.  Neck: Normal range of motion. Neck supple.  Cardiovascular: Normal rate, normal heart sounds and intact distal pulses.   Pulmonary/Chest: Effort normal and breath sounds normal.  Abdominal: Bowel sounds are normal. She exhibits no distension. There is no tenderness.  Musculoskeletal: Normal range of motion. She exhibits no edema and no tenderness.  Neurological: She is alert and oriented to person, place, and time. She has normal strength. No cranial nerve deficit or sensory deficit.  Skin: Skin is warm and dry. No rash noted.  Psychiatric: She has a normal mood and affect.    ED Course  Procedures (including critical care time)  Labs Reviewed  URINALYSIS, ROUTINE W REFLEX MICROSCOPIC - Abnormal; Notable for the following:    Color, Urine AMBER (*)  BIOCHEMICALS MAY BE AFFECTED BY COLOR   APPearance TURBID (*)     Bilirubin Urine SMALL (*)     Protein, ur 30 (*)     Leukocytes, UA SMALL (*)     All  other components within normal limits  URINE MICROSCOPIC-ADD ON - Abnormal; Notable for the following:    Squamous Epithelial / LPF MANY (*)     Bacteria, UA FEW (*)     Crystals CA OXALATE CRYSTALS (*)     All other components within normal limits  CBC WITH DIFFERENTIAL - Abnormal; Notable for the following:    RBC 5.21 (*)     All other components within normal limits  COMPREHENSIVE METABOLIC PANEL - Abnormal; Notable for the following:    AST 46 (*)     ALT 58 (*)     GFR calc non Af Amer 71 (*)     GFR calc Af Amer 82 (*)     All other components within normal limits   No results found.   No diagnosis found.    MDM  No significant electrolyte abnormalities. Will rehydrate in the ED and reassess for improvement.    5:47 PM Labs unremarkable. Feeling better after IVF. Still having some abdominal cramping. Will d/c with Bentyl, Imodium and PCP followup.      Brenetta Penny B. Bernette Mayers, MD 01/19/12 [phone]

## 2012-01-19 NOTE — ED Notes (Signed)
First liter of NS was started at approx.1520.  Will start a second bag after 1st bolus is done.

## 2013-10-14 ENCOUNTER — Emergency Department (HOSPITAL_BASED_OUTPATIENT_CLINIC_OR_DEPARTMENT_OTHER)
Admission: EM | Admit: 2013-10-14 | Discharge: 2013-10-15 | Disposition: A | Discharge status: Home / Self Care | Payer: BC Managed Care – PPO | Attending: Emergency Medicine | Admitting: Emergency Medicine

## 2013-10-14 ENCOUNTER — Encounter (HOSPITAL_BASED_OUTPATIENT_CLINIC_OR_DEPARTMENT_OTHER): Payer: Self-pay | Admitting: Emergency Medicine

## 2013-10-14 DIAGNOSIS — Z8619 Personal history of other infectious and parasitic diseases: Secondary | ICD-10-CM | POA: Insufficient documentation

## 2013-10-14 DIAGNOSIS — Z79899 Other long term (current) drug therapy: Secondary | ICD-10-CM | POA: Insufficient documentation

## 2013-10-14 DIAGNOSIS — E039 Hypothyroidism, unspecified: Secondary | ICD-10-CM | POA: Diagnosis not present

## 2013-10-14 DIAGNOSIS — R1031 Right lower quadrant pain: Secondary | ICD-10-CM | POA: Insufficient documentation

## 2013-10-14 DIAGNOSIS — Z87440 Personal history of urinary (tract) infections: Secondary | ICD-10-CM | POA: Diagnosis not present

## 2013-10-14 DIAGNOSIS — J45909 Unspecified asthma, uncomplicated: Secondary | ICD-10-CM | POA: Insufficient documentation

## 2013-10-14 DIAGNOSIS — Z9851 Tubal ligation status: Secondary | ICD-10-CM | POA: Diagnosis not present

## 2013-10-14 DIAGNOSIS — Z3202 Encounter for pregnancy test, result negative: Secondary | ICD-10-CM | POA: Insufficient documentation

## 2013-10-14 DIAGNOSIS — K5730 Diverticulosis of large intestine without perforation or abscess without bleeding: Secondary | ICD-10-CM | POA: Diagnosis not present

## 2013-10-14 DIAGNOSIS — Z9889 Other specified postprocedural states: Secondary | ICD-10-CM | POA: Insufficient documentation

## 2013-10-14 LAB — URINALYSIS, ROUTINE W REFLEX MICROSCOPIC
BILIRUBIN URINE: NEGATIVE
Glucose, UA: NEGATIVE mg/dL
Hgb urine dipstick: NEGATIVE
KETONES UR: NEGATIVE mg/dL
Leukocytes, UA: NEGATIVE
NITRITE: NEGATIVE
PH: 5.5 (ref 5.0–8.0)
PROTEIN: NEGATIVE mg/dL
Specific Gravity, Urine: 1.008 (ref 1.005–1.030)
Urobilinogen, UA: 0.2 mg/dL (ref 0.0–1.0)

## 2013-10-14 MED ORDER — SODIUM CHLORIDE 0.9 % IV SOLN
1000.0000 mL | INTRAVENOUS | Status: DC
  Administered 2013-10-15: 1000 mL via INTRAVENOUS

## 2013-10-14 MED ORDER — SODIUM CHLORIDE 0.9 % IV SOLN
1000.0000 mL | Freq: Once | INTRAVENOUS | Status: AC
  Administered 2013-10-15: 1000 mL via INTRAVENOUS

## 2013-10-14 NOTE — ED Notes (Signed)
Right lower quadrant pain x 2 weeks. She saw her MD and all her test were normal. She has been dizzy x 2 days.

## 2013-10-14 NOTE — ED Provider Notes (Signed)
CSN: [id number]     Arrival date & time 10/14/13  2133 History  This chart was scribed for Dione Boozeavid Christine Schiefelbein, MD by Annye AsaAnna Dorsett, ED Scribe. This patient was seen in room MH12/MH12 and the patient's care was started at 12:00 AM.     Chief Complaint  Patient presents with  . Abdominal Pain   The history is provided by the patient. No language interpreter was used.    HPI Comments: [name] is a 38 y.o. female with past medical history of endometrial ablation and tubal ligation who presents to the Emergency Department complaining of 2 weeks of RLQ pain and 2 days of dizziness. She describes that her pain is a dull ache at baseline, with occasional sharp stabbing sensations: she notes that it occasionally radiates up to her ribs or across her abdomen. She states her pain is at worst a 7-8/10 and rates it as a 7/10 at present. She also reports nausea. Patient notes that sitting for long periods makes her pain worse, as well as sneezing; she does not report that anything alleviates her pain. She denies vomiting or any urinary symptoms; denies dysuria, hematuria, increased frequency or urges. She denies any abnormal vaginal bleeding. She has taken OTC pain medication (ibuprofen) with minimal relief. Patient states that her appetite has been decreasing since the onset of her symptoms.   Her [facility]CP [facility]    Past Medical History  Diagnosis Date  . Abnormal Pap smear 1996  . H/O candidiasis   . H/O varicella   . Hx: UTI (urinary tract infection)   . Asthma   . Dyspareunia 2002  . Libido, decreased 2002  . H/O tinea cruris 2002  . H/O vulvovaginitis 2002  . H/O cervicitis 2002  . PCB (post coital bleeding) 2003  . H/O dysmenorrhea 2004  . H/O: menorrhagia 2003  . Endometriosis 2004  . Hypothyroidism   . Menses, irregular 2004  . Pelvic pain in female 2007  . Ovarian cyst, left 2008  . H/O amenorrhea 2009   Past Surgical History  Procedure Laterality Date  . Wisdom  tooth extraction  [id number]  . Breast surgery    . Novasure ablation    . Tubal ligation     Family History  Problem Relation Age of Onset  . Asthma Mother   . Diabetes Mother   . Depression Mother   . Asthma Father   . Heart disease Brother     Heart Murmur  . Arthritis Maternal Grandmother   . Cancer Maternal Grandmother     Breast & liver  . Depression Maternal Grandmother   . Heart disease Maternal Grandfather     MI  . Asthma Maternal Grandfather   . Diabetes Maternal Grandfather   . Kidney disease Maternal Grandfather     Dialysis   History  Substance Use Topics  . Smoking status: Never Smoker   . Smokeless tobacco: Never Used  . Alcohol Use: No   OB History   Grav Para Term Preterm Abortions TAB SAB Ect Mult Living   3 3 3       3      Review of Systems  Gastrointestinal: Positive for abdominal pain. Negative for nausea.  Genitourinary: Negative for dysuria, urgency, frequency and hematuria.  Neurological: Positive for dizziness.  All other systems reviewed and are negative.     Allergies  Sulfa drugs cross reactors  Home Medications   Prior to Admission medications   Medication Sig  Start Date End Date Taking? Authorizing Provider  FLUoxetine HCl (PROZAC PO) Take by mouth.   Yes Historical Provider, MD  hydroxychloroquine (PLAQUENIL) 200 MG tablet Take 200 mg by mouth daily.   Yes Historical Provider, MD  buPROPion (WELLBUTRIN XL) 300 MG 24 hr tablet Take 300 mg by mouth daily.    Historical Provider, MD  Citalopram Hydrobromide (CELEXA PO) Take by mouth.    Historical Provider, MD  dicyclomine (BENTYL) 20 MG tablet Take 1 tablet (20 mg total) by mouth 2 (two) times daily as needed (cramping). 01/19/12   Charles B. Bernette MayersSheldon, MD  levothyroxine (SYNTHROID, LEVOTHROID) 125 MCG tablet Take 125 mcg by mouth daily.    Historical Provider, MD  loperamide (IMODIUM) 2 MG capsule Take 1 capsule (2 mg total) by mouth 4 (four) times daily as needed for diarrhea or  loose stools. 01/19/12   Charles B. Bernette MayersSheldon, MD  omeprazole (PRILOSEC) 40 MG capsule Take 40 mg by mouth daily.    Historical Provider, MD   BP 132/87  Pulse 82  Temp(Src) 97.9 F (36.6 C) (Oral)  Resp 20  Ht 5\' 1"  (1.549 m)  Wt 230 lb (104.327 kg)  BMI 43.48 kg/m2  SpO2 97% Physical Exam  Nursing note and vitals reviewed. Constitutional: She is oriented to person, place, and time. She appears well-developed and well-nourished.  HENT:  Head: Normocephalic and atraumatic.  Eyes: Pupils are equal, round, and reactive to light.  Neck: Normal range of motion. Neck supple. No JVD present.  Cardiovascular: Normal rate, regular rhythm and normal heart sounds.  Exam reveals no gallop and no friction rub.   No murmur heard. Pulmonary/Chest: Effort normal and breath sounds normal. No respiratory distress. She has no wheezes. She has no rales.  Abdominal: Soft.  Abdomen: mild mid-abdominal tenderness and moderate RLQ tenderness No guarding, +/- rebound tenderness, bowel sounds decreased   Musculoskeletal: Normal range of motion. She exhibits no edema.  Lymphadenopathy:    She has no cervical adenopathy.  Neurological: She is alert and oriented to person, place, and time. No cranial nerve deficit. Coordination normal.  Skin: Skin is warm and dry. No rash noted.  Psychiatric: She has a normal mood and affect. Her behavior is normal. Thought content normal.    ED Course  Procedures   DIAGNOSTIC STUDIES: Oxygen Saturation is 97% on room air, normal by my interpretation.    COORDINATION OF CARE: 12:05 AM Discussed treatment plan with pt at bedside and pt agreed to plan.   Labs Review Labs Reviewed  URINALYSIS, ROUTINE W REFLEX MICROSCOPIC  CBC WITH DIFFERENTIAL  PREGNANCY, URINE  COMPREHENSIVE METABOLIC PANEL  LIPASE, BLOOD    Imaging Review Ct Abdomen Pelvis W Contrast  10/15/2013   CLINICAL DATA:  Right lower quadrant abdominal pain for 2 weeks. Initial encounter.  EXAM: CT  ABDOMEN AND PELVIS WITH CONTRAST  TECHNIQUE: Multidetector CT imaging of the abdomen and pelvis was performed using the standard protocol following bolus administration of intravenous contrast.  CONTRAST:  50mL OMNIPAQUE IOHEXOL 300 MG/ML SOLN, 100mL OMNIPAQUE IOHEXOL 300 MG/ML SOLN  COMPARISON:  CT abdomen pelvis - 08/06/2007  FINDINGS: Normal hepatic contour. No discrete hepatic lesions. Normal appearance of the gallbladder. No radiopaque gallstones. No intra or extrahepatic biliary duct dilatation. No ascites.  There is symmetric enhancement and excretion of the bilateral kidneys. No definite renal stones on this postcontrast examination. No discrete renal lesions. No urinary obstruction or perinephric stranding. Normal appearance of the bilateral adrenal glands, pancreas and spleen.  Scatter minimal colonic diverticulosis without evidence of diverticulitis. The bowel is normal in course and caliber without wall thickening or evidence of obstruction. Normal appearance of the appendix. Normal appearance of the terminal ileum. No pneumoperitoneum, pneumatosis or portal venous gas.  Normal caliber of the abdominal aorta. The major branch vessels of the abdominal aorta appear widely patent on this non CTA examination.  Scattered shotty retroperitoneal lymph nodes are individually not enlarged by size criteria with index pericaval lymph node measuring 0.9 cm in greatest short axis diameter (image 33, series 2). No retroperitoneal, mesenteric, pelvic or inguinal lymphadenopathy.  Normal appearance of the pelvic organs. No discrete adnexal lesions. No free fluid in the pelvic cul-de-sac. Normal appearance of the urinary bladder given degree distention.  Limited visualization the lower thorax demonstrates bibasilar dependent ground-glass atelectasis. No discrete focal airspace opacities. No pleural effusion.  Normal heart size.  No pericardial effusion.  No acute or aggressive osseus abnormalities. Regional soft tissues  appear normal.  IMPRESSION: No explanation for patient's persistent right lower quadrant abdominal pain. Specifically, no evidence of enteric or urinary obstruction. Normal appearance of the appendix.   Electronically Signed   By: Simonne Come M.D.   On: 10/15/2013 01:43   Images viewed by me.  MDM   Final diagnoses:  Right lower quadrant abdominal pain  Diverticulosis of large intestine without hemorrhage    Right lower quadrant pain of uncertain cause. However, I do feel that appendicitis needs to be ruled out. She will be sent for CT scan. The meantime, she was given IV fluids, hydromorphone for pain and ondansetron for nausea. All records are reviewed and she does have a history of endometriosis treated with endometrial ablation and had been symptom-free since then. Risk of pregnancy is low, but pregnancy test will need to be checked.  She had good relief of pain with the above noted treatment. Pregnancy test is normal and laboratory evaluation is completely normal. CT scan is significant only for diverticulosis without evidence of diverticulitis. There is no evidence of appendicitis, urolithiasis, or any other significant finding. At this point, the cause of the pain is unclear but I wonder she might have a mild diverticulitis which is not showing up on her CT scan. She will be given an empiric course of ciprofloxacin and metronidazole and is also given prescription for oxycodone-acetaminophen and ondansetron. She is to follow up with her PCP. If symptoms do not improve, I would be concerned about possibility of recurrence of endometriosis.  I personally performed the services described in this documentation, which was scribed in my presence. The recorded information has been reviewed and is accurate.      Dione Booze, MD 10/15/13 [phone]

## 2013-10-15 ENCOUNTER — Emergency Department (HOSPITAL_BASED_OUTPATIENT_CLINIC_OR_DEPARTMENT_OTHER): Payer: BC Managed Care – PPO

## 2013-10-15 LAB — CBC WITH DIFFERENTIAL/PLATELET
BASOS ABS: 0 10*3/uL (ref 0.0–0.1)
BASOS PCT: 0 % (ref 0–1)
Eosinophils Absolute: 0.3 10*3/uL (ref 0.0–0.7)
Eosinophils Relative: 5 % (ref 0–5)
HCT: 38.6 % (ref 36.0–46.0)
Hemoglobin: 12.4 g/dL (ref 12.0–15.0)
Lymphocytes Relative: 28 % (ref 12–46)
Lymphs Abs: 1.6 10*3/uL (ref 0.7–4.0)
MCH: 26.1 pg (ref 26.0–34.0)
MCHC: 32.1 g/dL (ref 30.0–36.0)
MCV: 81.1 fL (ref 78.0–100.0)
Monocytes Absolute: 0.5 10*3/uL (ref 0.1–1.0)
Monocytes Relative: 9 % (ref 3–12)
NEUTROS ABS: 3.3 10*3/uL (ref 1.7–7.7)
Neutrophils Relative %: 58 % (ref 43–77)
PLATELETS: 270 10*3/uL (ref 150–400)
RBC: 4.76 MIL/uL (ref 3.87–5.11)
RDW: 14.6 % (ref 11.5–15.5)
WBC: 5.7 10*3/uL (ref 4.0–10.5)

## 2013-10-15 LAB — COMPREHENSIVE METABOLIC PANEL
ALBUMIN: 3.9 g/dL (ref 3.5–5.2)
ALK PHOS: 84 U/L (ref 39–117)
ALT: 14 U/L (ref 0–35)
AST: 15 U/L (ref 0–37)
Anion gap: 15 (ref 5–15)
BUN: 18 mg/dL (ref 6–23)
CO2: 24 mEq/L (ref 19–32)
Calcium: 9.6 mg/dL (ref 8.4–10.5)
Chloride: 99 mEq/L (ref 96–112)
Creatinine, Ser: 1 mg/dL (ref 0.50–1.10)
GFR calc Af Amer: 81 mL/min — ABNORMAL LOW (ref >90)
GFR calc non Af Amer: 69 mL/min — ABNORMAL LOW (ref >90)
Glucose, Bld: 107 mg/dL — ABNORMAL HIGH (ref 70–99)
POTASSIUM: 4 meq/L (ref 3.7–5.3)
SODIUM: 138 meq/L (ref 137–147)
TOTAL PROTEIN: 8.5 g/dL — AB (ref 6.0–8.3)
Total Bilirubin: 0.4 mg/dL (ref 0.3–1.2)

## 2013-10-15 LAB — PREGNANCY, URINE: PREG TEST UR: NEGATIVE

## 2013-10-15 LAB — LIPASE, BLOOD: Lipase: 26 U/L (ref 11–59)

## 2013-10-15 MED ORDER — ONDANSETRON HCL 4 MG/2ML IJ SOLN
4.0000 mg | Freq: Once | INTRAMUSCULAR | Status: AC
  Administered 2013-10-15: 4 mg via INTRAVENOUS
  Filled 2013-10-15: qty 2

## 2013-10-15 MED ORDER — CIPROFLOXACIN HCL 500 MG PO TABS: 500.0000 mg | ORAL_TABLET | Freq: Two times a day (BID) | ORAL | Status: DC

## 2013-10-15 MED ORDER — CIPROFLOXACIN HCL 500 MG PO TABS
500.0000 mg | ORAL_TABLET | Freq: Once | ORAL | Status: AC
  Administered 2013-10-15: 500 mg via ORAL
  Filled 2013-10-15: qty 1

## 2013-10-15 MED ORDER — METRONIDAZOLE 500 MG PO TABS: 500.0000 mg | ORAL_TABLET | Freq: Three times a day (TID) | ORAL | Status: DC

## 2013-10-15 MED ORDER — IOHEXOL 300 MG/ML  SOLN
50.0000 mL | Freq: Once | INTRAMUSCULAR | Status: AC | PRN
  Administered 2013-10-15: 50 mL via ORAL

## 2013-10-15 MED ORDER — OXYCODONE-ACETAMINOPHEN 5-325 MG PO TABS: 1.0000 | ORAL_TABLET | ORAL | Status: DC | PRN

## 2013-10-15 MED ORDER — IOHEXOL 300 MG/ML  SOLN
100.0000 mL | Freq: Once | INTRAMUSCULAR | Status: AC | PRN
  Administered 2013-10-15: 100 mL via INTRAVENOUS

## 2013-10-15 MED ORDER — HYDROMORPHONE HCL 1 MG/ML IJ SOLN
1.0000 mg | Freq: Once | INTRAMUSCULAR | Status: AC
  Administered 2013-10-15: 1 mg via INTRAVENOUS
  Filled 2013-10-15: qty 1

## 2013-10-15 MED ORDER — ONDANSETRON HCL 4 MG PO TABS: 4.0000 mg | ORAL_TABLET | Freq: Four times a day (QID) | ORAL | Status: DC | PRN

## 2013-10-15 MED ORDER — METRONIDAZOLE 500 MG PO TABS
500.0000 mg | ORAL_TABLET | Freq: Once | ORAL | Status: AC
  Administered 2013-10-15: 500 mg via ORAL
  Filled 2013-10-15: qty 1

## 2013-10-15 NOTE — ED Notes (Signed)
Pt vomiting up contrast

## 2013-10-15 NOTE — ED Notes (Signed)
Pt returned from CT °

## 2013-10-15 NOTE — Discharge Instructions (Signed)
Your evaluation tonight did not show a clear cause for your pain. However, there was no sign of appendicitis, urinary tract infection, kidney stone, ovarian cyst. There were some pockets and your bowel called diverticulosis. Sometimes these can become inflamed and you will be in treated empirically for possible infection and knees. Please followup with your PCP in 2 days. If you're not showing any trend toward improvement, your PCP may consider additional testing. Return to the ED if symptoms are getting worse.   Abdominal Pain, Women Abdominal (stomach, pelvic, or belly) pain can be caused by many things. It is important to tell your doctor:  The location of the pain.  Does it come and go or is it present all the time?  Are there things that start the pain (eating certain foods, exercise)?  Are there other symptoms associated with the pain (fever, nausea, vomiting, diarrhea)? All of this is helpful to know when trying to find the cause of the pain. CAUSES   Stomach: virus or bacteria infection, or ulcer.  Intestine: appendicitis (inflamed appendix), regional ileitis (Crohn's disease), ulcerative colitis (inflamed colon), irritable bowel syndrome, diverticulitis (inflamed diverticulum of the colon), or cancer of the stomach or intestine.  Gallbladder disease or stones in the gallbladder.  Kidney disease, kidney stones, or infection.  Pancreas infection or cancer.  Fibromyalgia (pain disorder).  Diseases of the female organs:  Uterus: fibroid (non-cancerous) tumors or infection.  Fallopian tubes: infection or tubal pregnancy.  Ovary: cysts or tumors.  Pelvic adhesions (scar tissue).  Endometriosis (uterus lining tissue growing in the pelvis and on the pelvic organs).  Pelvic congestion syndrome (female organs filling up with blood just before the menstrual period).  Pain with the menstrual period.  Pain with ovulation (producing an egg).  Pain with an IUD (intrauterine  device, birth control) in the uterus.  Cancer of the female organs.  Functional pain (pain not caused by a disease, may improve without treatment).  Psychological pain.  Depression. DIAGNOSIS  Your doctor will decide the seriousness of your pain by doing an examination.  Blood tests.  X-rays.  Ultrasound.  CT scan (computed tomography, special type of X-ray).  MRI (magnetic resonance imaging).  Cultures, for infection.  Barium enema (dye inserted in the large intestine, to better view it with X-rays).  Colonoscopy (looking in intestine with a lighted tube).  Laparoscopy (minor surgery, looking in abdomen with a lighted tube).  Major abdominal exploratory surgery (looking in abdomen with a large incision). TREATMENT  The treatment will depend on the cause of the pain.   Many cases can be observed and treated at home.  Over-the-counter medicines recommended by your caregiver.  Prescription medicine.  Antibiotics, for infection.  Birth control pills, for painful periods or for ovulation pain.  Hormone treatment, for endometriosis.  Nerve blocking injections.  Physical therapy.  Antidepressants.  Counseling with a psychologist or psychiatrist.  Minor or major surgery. HOME CARE INSTRUCTIONS   Do not take laxatives, unless directed by your caregiver.  Take over-the-counter pain medicine only if ordered by your caregiver. Do not take aspirin because it can cause an upset stomach or bleeding.  Try a clear liquid diet (broth or water) as ordered by your caregiver. Slowly move to a bland diet, as tolerated, if the pain is related to the stomach or intestine.  Have a thermometer and take your temperature several times a day, and record it.  Bed rest and sleep, if it helps the pain.  Avoid sexual intercourse, if  it causes pain.  Avoid stressful situations.  Keep your follow-up appointments and tests, as your caregiver orders.  If the pain does not go away  with medicine or surgery, you may try:  Acupuncture.  Relaxation exercises (yoga, meditation).  Group therapy.  Counseling. SEEK MEDICAL CARE IF:   You notice certain foods cause stomach pain.  Your home care treatment is not helping your pain.  You need stronger pain medicine.  You want your IUD removed.  You feel faint or lightheaded.  You develop nausea and vomiting.  You develop a rash.  You are having side effects or an allergy to your medicine. SEEK IMMEDIATE MEDICAL CARE IF:   Your pain does not go away or gets worse.  You have a fever.  Your pain is felt only in portions of the abdomen. The right side could possibly be appendicitis. The left lower portion of the abdomen could be colitis or diverticulitis.  You are passing blood in your stools (bright red or black tarry stools, with or without vomiting).  You have blood in your urine.  You develop chills, with or without a fever.  You pass out. MAKE SURE YOU:   Understand these instructions.  Will watch your condition.  Will get help right away if you are not doing well or get worse. Document Released: 10/17/2006 Document Revised: 05/06/2013 Document Reviewed: 11/06/2008 [facility] Patient Information 2015 [place], [place]. This information is not intended to replace advice given to you by your health care provider. Make sure you discuss any questions you have with your health care provider.  Diverticulitis Diverticulitis is inflammation or infection of small pouches in your colon that form when you have a condition called diverticulosis. The pouches in your colon are called diverticula. Your colon, or large intestine, is where water is absorbed and stool is formed. Complications of diverticulitis can include:  Bleeding.  Severe infection.  Severe pain.  Perforation of your colon.  Obstruction of your colon. CAUSES  Diverticulitis is caused by bacteria. Diverticulitis happens when stool becomes  trapped in diverticula. This allows bacteria to grow in the diverticula, which can lead to inflammation and infection. RISK FACTORS People with diverticulosis are at risk for diverticulitis. Eating a diet that does not include enough fiber from fruits and vegetables may make diverticulitis more likely to develop. SYMPTOMS  Symptoms of diverticulitis may include:  Abdominal pain and tenderness. The pain is normally located on the left side of the abdomen, but may occur in other areas.  Fever and chills.  Bloating.  Cramping.  Nausea.  Vomiting.  Constipation.  Diarrhea.  Blood in your stool. DIAGNOSIS  Your health care provider will ask you about your medical history and do a physical exam. You may need to have tests done because many medical conditions can cause the same symptoms as diverticulitis. Tests may include:  Blood tests.  Urine tests.  Imaging tests of the abdomen, including X-rays and CT scans. When your condition is under control, your health care provider may recommend that you have a colonoscopy. A colonoscopy can show how severe your diverticula are and whether something else is causing your symptoms. TREATMENT  Most cases of diverticulitis are mild and can be treated at home. Treatment may include:  Taking over-the-counter pain medicines.  Following a clear liquid diet.  Taking antibiotic medicines by mouth for 7-10 days. More severe cases may be treated at a hospital. Treatment may include:  Not eating or drinking.  Taking prescription pain medicine.  Receiving antibiotic medicines through an IV tube.  Receiving fluids and nutrition through an IV tube.  Surgery. HOME CARE INSTRUCTIONS   Follow your health care provider's instructions carefully.  Follow a full liquid diet or other diet as directed by your health care provider. After your symptoms improve, your health care provider may tell you to change your diet. He or she may recommend you eat  a high-fiber diet. Fruits and vegetables are good sources of fiber. Fiber makes it easier to pass stool.  Take fiber supplements or probiotics as directed by your health care provider.  Only take medicines as directed by your health care provider.  Keep all your follow-up appointments. SEEK MEDICAL CARE IF:   Your pain does not improve.  You have a hard time eating food.  Your bowel movements do not return to normal. SEEK IMMEDIATE MEDICAL CARE IF:   Your pain becomes worse.  Your symptoms do not get better.  Your symptoms suddenly get worse.  You have a fever.  You have repeated vomiting.  You have bloody or black, tarry stools. MAKE SURE YOU:   Understand these instructions.  Will watch your condition.  Will get help right away if you are not doing well or get worse. Document Released: 09/29/2004 Document Revised: 12/25/2012 Document Reviewed: 11/14/2012 [facility] Patient Information 2015 [place]. This information is not intended to replace advice given to you by your health care provider. Make sure you discuss any questions you have with your health care provider.  Ciprofloxacin tablets What is this medicine? CIPROFLOXACIN (sip roe FLOX a sin) is a quinolone antibiotic. It is used to treat certain kinds of bacterial infections. It will not work for colds, flu, or other viral infections. This medicine may be used for other purposes; ask your health care provider or pharmacist if you have questions. COMMON BRAND NAME(S): Cipro What should I tell my health care provider before I take this medicine? They need to know if you have any of these conditions: -bone problems -cerebral disease -joint problems -irregular heartbeat -kidney disease -liver disease -myasthenia gravis -seizure disorder -tendon problems -an unusual or allergic reaction to ciprofloxacin, other antibiotics or medicines, foods, dyes, or preservatives -pregnant or trying to get  pregnant -breast-feeding How should I use this medicine? Take this medicine by mouth with a glass of water. Follow the directions on the prescription label. Take your medicine at regular intervals. Do not take your medicine more often than directed. Take all of your medicine as directed even if you think your are better. Do not skip doses or stop your medicine early. You can take this medicine with food or on an empty stomach. It can be taken with a meal that contains dairy or calcium, but do not take it alone with a dairy product, like milk or yogurt or calcium-fortified juice. A special MedGuide will be given to you by the pharmacist with each prescription and refill. Be sure to read this information carefully each time. Talk to your pediatrician regarding the use of this medicine in children. Special care may be needed. Overdosage: If you think you have taken too much of this medicine contact a poison control center or emergency room at once. NOTE: This medicine is only for you. Do not share this medicine with others. What if I miss a dose? If you miss a dose, take it as soon as you can. If it is almost time for your next dose, take only that dose. Do not take double  or extra doses. What may interact with this medicine? Do not take this medicine with any of the following medications: -cisapride -droperidol -terfenadine -tizanidine This medicine may also interact with the following medications: -antacids -birth control pills -caffeine -cyclosporin -didanosine (ddI) buffered tablets or powder -medicines for diabetes -medicines for inflammation like ibuprofen, naproxen -methotrexate -multivitamins -omeprazole -phenytoin -probenecid -sucralfate -theophylline -warfarin This list may not describe all possible interactions. Give your health care provider a list of all the medicines, herbs, non-prescription drugs, or dietary supplements you use. Also tell them if you smoke, drink alcohol,  or use illegal drugs. Some items may interact with your medicine. What should I watch for while using this medicine? Tell your doctor or health care professional if your symptoms do not improve. Do not treat diarrhea with over the counter products. Contact your doctor if you have diarrhea that lasts more than 2 days or if it is severe and watery. You may get drowsy or dizzy. Do not drive, use machinery, or do anything that needs mental alertness until you know how this medicine affects you. Do not stand or sit up quickly, especially if you are an older patient. This reduces the risk of dizzy or fainting spells. This medicine can make you more sensitive to the sun. Keep out of the sun. If you cannot avoid being in the sun, wear protective clothing and use sunscreen. Do not use sun lamps or tanning beds/booths. Avoid antacids, aluminum, calcium, iron, magnesium, and zinc products for 6 hours before and 2 hours after taking a dose of this medicine. What side effects may I notice from receiving this medicine? Side effects that you should report to your doctor or health care professional as soon as possible: - allergic reactions like skin rash, itching or hives, swelling of the face, lips, or tongue - breathing problems - confusion, nightmares or hallucinations - feeling faint or lightheaded, falls - irregular heartbeat - joint, muscle or tendon pain or swelling - pain or trouble passing urine -persistent headache with or without blurred vision - redness, blistering, peeling or loosening of the skin, including inside the mouth - seizure - unusual pain, numbness, tingling, or weakness Side effects that usually do not require medical attention (report to your doctor or health care professional if they continue or are bothersome): - diarrhea - nausea or stomach upset - white patches or sores in the mouth This list may not describe all possible side effects. Call your doctor for medical  advice about side effects. You may report side effects to FDA at 1-800-FDA-1088. Where should I keep my medicine? Keep out of the reach of children. Store at room temperature below 30 degrees C (86 degrees F). Keep container tightly closed. Throw away any unused medicine after the expiration date. NOTE: This sheet is a summary. It may not cover all possible information. If you have questions about this medicine, talk to your doctor, pharmacist, or health care provider.  2015, [employer]. (2012-07-26 16:10:46)  Metronidazole tablets or capsules What is this medicine? METRONIDAZOLE (me troe NI da zole) is an antiinfective. It is used to treat certain kinds of bacterial and protozoal infections. It will not work for colds, flu, or other viral infections. This medicine may be used for other purposes; ask your health care provider or pharmacist if you have questions. COMMON BRAND NAME(S): Flagyl What should I tell my health care provider before I take this medicine? They need to know if you have any of these conditions: -anemia or  other blood disorders -disease of the nervous system -fungal or yeast infection -if you drink alcohol containing drinks -liver disease -seizures -an unusual or allergic reaction to metronidazole, or other medicines, foods, dyes, or preservatives -pregnant or trying to get pregnant -breast-feeding How should I use this medicine? Take this medicine by mouth with a full glass of water. Follow the directions on the prescription label. Take your medicine at regular intervals. Do not take your medicine more often than directed. Take all of your medicine as directed even if you think you are better. Do not skip doses or stop your medicine early. Talk to your pediatrician regarding the use of this medicine in children. Special care may be needed. Overdosage: If you think you have taken too much of this medicine contact a poison control center or emergency room at  once. NOTE: This medicine is only for you. Do not share this medicine with others. What if I miss a dose? If you miss a dose, take it as soon as you can. If it is almost time for your next dose, take only that dose. Do not take double or extra doses. What may interact with this medicine? Do not take this medicine with any of the following medications: -alcohol or any product that contains alcohol -amprenavir oral solution -cisapride -disulfiram -dofetilide -dronedarone -paclitaxel injection -pimozide -ritonavir oral solution -sertraline oral solution -sulfamethoxazole-trimethoprim injection -thioridazine -ziprasidone This medicine may also interact with the following medications: -birth control pills -cimetidine -lithium -other medicines that prolong the QT interval (cause an abnormal heart rhythm) -phenobarbital -phenytoin -warfarin This list may not describe all possible interactions. Give your health care provider a list of all the medicines, herbs, non-prescription drugs, or dietary supplements you use. Also tell them if you smoke, drink alcohol, or use illegal drugs. Some items may interact with your medicine. What should I watch for while using this medicine? Tell your doctor or health care professional if your symptoms do not improve or if they get worse. You may get drowsy or dizzy. Do not drive, use machinery, or do anything that needs mental alertness until you know how this medicine affects you. Do not stand or sit up quickly, especially if you are an older patient. This reduces the risk of dizzy or fainting spells. Avoid alcoholic drinks while you are taking this medicine and for three days afterward. Alcohol may make you feel dizzy, sick, or flushed. If you are being treated for a sexually transmitted disease, avoid sexual contact until you have finished your treatment. Your sexual partner may also need treatment. What side effects may I notice from receiving this  medicine? Side effects that you should report to your doctor or health care professional as soon as possible: -allergic reactions like skin rash or hives, swelling of the face, lips, or tongue -confusion, clumsiness -difficulty speaking -discolored or sore mouth -dizziness -fever, infection -numbness, tingling, pain or weakness in the hands or feet -trouble passing urine or change in the amount of urine -redness, blistering, peeling or loosening of the skin, including inside the mouth -seizures -unusually weak or tired -vaginal irritation, dryness, or discharge Side effects that usually do not require medical attention (report to your doctor or health care professional if they continue or are bothersome): -diarrhea -headache -irritability -metallic taste -nausea -stomach pain or cramps -trouble sleeping This list may not describe all possible side effects. Call your doctor for medical advice about side effects. You may report side effects to FDA at 1-800-FDA-1088. Where should I  keep my medicine? Keep out of the reach of children. Store at room temperature below 25 degrees C (77 degrees F). Protect from light. Keep container tightly closed. Throw away any unused medicine after the expiration date. NOTE: This sheet is a summary. It may not cover all possible information. If you have questions about this medicine, talk to your doctor, pharmacist, or health care provider.  2015, [employer] Standard. (2012-07-27 14:08:39)  Ondansetron tablets What is this medicine? ONDANSETRON (on DAN se tron) is used to treat nausea and vomiting caused by chemotherapy. It is also used to prevent or treat nausea and vomiting after surgery. This medicine may be used for other purposes; ask your health care provider or pharmacist if you have questions. COMMON BRAND NAME(S): Zofran What should I tell my health care provider before I take this medicine? They need to know if you have any of these  conditions: -heart disease -history of irregular heartbeat -liver disease -low levels of magnesium or potassium in the blood -an unusual or allergic reaction to ondansetron, granisetron, other medicines, foods, dyes, or preservatives -pregnant or trying to get pregnant -breast-feeding How should I use this medicine? Take this medicine by mouth with a glass of water. Follow the directions on your prescription label. Take your doses at regular intervals. Do not take your medicine more often than directed. Talk to your pediatrician regarding the use of this medicine in children. Special care may be needed. Overdosage: If you think you have taken too much of this medicine contact a poison control center or emergency room at once. NOTE: This medicine is only for you. Do not share this medicine with others. What if I miss a dose? If you miss a dose, take it as soon as you can. If it is almost time for your next dose, take only that dose. Do not take double or extra doses. What may interact with this medicine? Do not take this medicine with any of the following medications: -apomorphine -certain medicines for fungal infections like fluconazole, itraconazole, ketoconazole, posaconazole, voriconazole -cisapride -dofetilide -dronedarone -pimozide -thioridazine -ziprasidone This medicine may also interact with the following medications: -carbamazepine -certain medicines for depression, anxiety, or psychotic disturbances -fentanyl -linezolid -MAOIs like Carbex, Eldepryl, Marplan, Nardil, and Parnate -methylene blue (injected into a vein) -other medicines that prolong the QT interval (cause an abnormal heart rhythm) -phenytoin -rifampicin -tramadol This list may not describe all possible interactions. Give your health care provider a list of all the medicines, herbs, non-prescription drugs, or dietary supplements you use. Also tell them if you smoke, drink alcohol, or use illegal drugs. Some  items may interact with your medicine. What should I watch for while using this medicine? Check with your doctor or health care professional right away if you have any sign of an allergic reaction. What side effects may I notice from receiving this medicine? Side effects that you should report to your doctor or health care professional as soon as possible: -allergic reactions like skin rash, itching or hives, swelling of the face, lips or tongue -breathing problems -confusion -dizziness -fast or irregular heartbeat -feeling faint or lightheaded, falls -fever and chills -loss of balance or coordination -seizures -sweating -swelling of the hands or feet -tightness in the chest -tremors -unusually weak or tired Side effects that usually do not require medical attention (report to your doctor or health care professional if they continue or are bothersome): -constipation or diarrhea -headache This list may not describe all possible side effects. Call your doctor  for medical advice about side effects. You may report side effects to FDA at 1-800-FDA-1088. Where should I keep my medicine? Keep out of the reach of children. Store between 2 and 30 degrees C (36 and 86 degrees F). Throw away any unused medicine after the expiration date. NOTE: This sheet is a summary. It may not cover all possible information. If you have questions about this medicine, talk to your doctor, pharmacist, or health care provider.  2015, [employer] Standard. (2012-09-26 16:27:45)  Acetaminophen; Oxycodone tablets What is this medicine? ACETAMINOPHEN; OXYCODONE (a set a MEE noe fen; ox i KOE done) is a pain reliever. It is used to treat mild to moderate pain. This medicine may be used for other purposes; ask your health care provider or pharmacist if you have questions. COMMON BRAND NAME(S): Endocet, Magnacet, Narvox, Percocet, Perloxx, Primalev, Primlev, Roxicet, Xolox What should I tell my health care provider  before I take this medicine? They need to know if you have any of these conditions: -brain tumor -Crohn's disease, inflammatory bowel disease, or ulcerative colitis -drug abuse or addiction -head injury -heart or circulation problems -if you often drink alcohol -kidney disease or problems going to the bathroom -liver disease -lung disease, asthma, or breathing problems -an unusual or allergic reaction to acetaminophen, oxycodone, other opioid analgesics, other medicines, foods, dyes, or preservatives -pregnant or trying to get pregnant -breast-feeding How should I use this medicine? Take this medicine by mouth with a full glass of water. Follow the directions on the prescription label. Take your medicine at regular intervals. Do not take your medicine more often than directed. Talk to your pediatrician regarding the use of this medicine in children. Special care may be needed. Patients over 6 years old may have a stronger reaction and need a smaller dose. Overdosage: If you think you have taken too much of this medicine contact a poison control center or emergency room at once. NOTE: This medicine is only for you. Do not share this medicine with others. What if I miss a dose? If you miss a dose, take it as soon as you can. If it is almost time for your next dose, take only that dose. Do not take double or extra doses. What may interact with this medicine? -alcohol -antihistamines -barbiturates like amobarbital, butalbital, butabarbital, methohexital, pentobarbital, phenobarbital, thiopental, and secobarbital -benztropine -drugs for bladder problems like solifenacin, trospium, oxybutynin, tolterodine, hyoscyamine, and methscopolamine -drugs for breathing problems like ipratropium and tiotropium -drugs for certain stomach or intestine problems like propantheline, homatropine methylbromide, glycopyrrolate, atropine, belladonna, and dicyclomine -general anesthetics like etomidate,  ketamine, nitrous oxide, propofol, desflurane, enflurane, halothane, isoflurane, and sevoflurane -medicines for depression, anxiety, or psychotic disturbances -medicines for sleep -muscle relaxants -naltrexone -narcotic medicines (opiates) for pain -phenothiazines like perphenazine, thioridazine, chlorpromazine, mesoridazine, fluphenazine, prochlorperazine, promazine, and trifluoperazine -scopolamine -tramadol -trihexyphenidyl This list may not describe all possible interactions. Give your health care provider a list of all the medicines, herbs, non-prescription drugs, or dietary supplements you use. Also tell them if you smoke, drink alcohol, or use illegal drugs. Some items may interact with your medicine. What should I watch for while using this medicine? Tell your doctor or health care professional if your pain does not go away, if it gets worse, or if you have new or a different type of pain. You may develop tolerance to the medicine. Tolerance means that you will need a higher dose of the medication for pain relief. Tolerance is normal and is expected if you  take this medicine for a long time. Do not suddenly stop taking your medicine because you may develop a severe reaction. Your body becomes used to the medicine. This does NOT mean you are addicted. Addiction is a behavior related to getting and using a drug for a non-medical reason. If you have pain, you have a medical reason to take pain medicine. Your doctor will tell you how much medicine to take. If your doctor wants you to stop the medicine, the dose will be slowly lowered over time to avoid any side effects. You may get drowsy or dizzy. Do not drive, use machinery, or do anything that needs mental alertness until you know how this medicine affects you. Do not stand or sit up quickly, especially if you are an older patient. This reduces the risk of dizzy or fainting spells. Alcohol may interfere with the effect of this medicine. Avoid  alcoholic drinks. There are different types of narcotic medicines (opiates) for pain. If you take more than one type at the same time, you may have more side effects. Give your health care provider a list of all medicines you use. Your doctor will tell you how much medicine to take. Do not take more medicine than directed. Call emergency for help if you have problems breathing. The medicine will cause constipation. Try to have a bowel movement at least every 2 to 3 days. If you do not have a bowel movement for 3 days, call your doctor or health care professional. Do not take Tylenol (acetaminophen) or medicines that have acetaminophen with this medicine. Too much acetaminophen can be very dangerous. Many nonprescription medicines contain acetaminophen. Always read the labels carefully to avoid taking more acetaminophen. What side effects may I notice from receiving this medicine? Side effects that you should report to your doctor or health care professional as soon as possible: -allergic reactions like skin rash, itching or hives, swelling of the face, lips, or tongue -breathing difficulties, wheezing -confusion -light headedness or fainting spells -severe stomach pain -unusually weak or tired -yellowing of the skin or the whites of the eyes Side effects that usually do not require medical attention (report to your doctor or health care professional if they continue or are bothersome): -dizziness -drowsiness -nausea -vomiting This list may not describe all possible side effects. Call your doctor for medical advice about side effects. You may report side effects to FDA at 1-800-FDA-1088. Where should I keep my medicine? Keep out of the reach of children. This medicine can be abused. Keep your medicine in a safe place to protect it from theft. Do not share this medicine with anyone. Selling or giving away this medicine is dangerous and against the law. Store at room temperature between 20 and 25  degrees C (68 and 77 degrees F). Keep container tightly closed. Protect from light. This medicine may cause accidental overdose and death if it is taken by other adults, children, or pets. Flush any unused medicine down the toilet to reduce the chance of harm. Do not use the medicine after the expiration date. NOTE: This sheet is a summary. It may not cover all possible information. If you have questions about this medicine, talk to your doctor, pharmacist, or health care provider.  2015, Elsevier/Gold Standard. (2012-08-13 13:17:35)

## 2013-10-15 NOTE — ED Notes (Signed)
Patient transported to CT 

## 2013-10-15 NOTE — ED Notes (Signed)
Pt finished with oral contrast, CT notified  

## 2013-11-04 ENCOUNTER — Encounter (HOSPITAL_BASED_OUTPATIENT_CLINIC_OR_DEPARTMENT_OTHER): Payer: Self-pay | Admitting: Emergency Medicine

## 2016-07-22 DIAGNOSIS — Z79899 Other long term (current) drug therapy: Secondary | ICD-10-CM | POA: Insufficient documentation

## 2016-07-22 DIAGNOSIS — Z8719 Personal history of other diseases of the digestive system: Secondary | ICD-10-CM | POA: Insufficient documentation

## 2016-07-22 DIAGNOSIS — Z8659 Personal history of other mental and behavioral disorders: Secondary | ICD-10-CM | POA: Insufficient documentation

## 2016-07-22 DIAGNOSIS — Z8639 Personal history of other endocrine, nutritional and metabolic disease: Secondary | ICD-10-CM | POA: Insufficient documentation

## 2016-07-22 DIAGNOSIS — M8589 Other specified disorders of bone density and structure, multiple sites: Secondary | ICD-10-CM | POA: Insufficient documentation

## 2016-07-22 DIAGNOSIS — M359 Systemic involvement of connective tissue, unspecified: Secondary | ICD-10-CM | POA: Insufficient documentation

## 2016-07-22 NOTE — Progress Notes (Signed)
Office Visit Note  Patient: [name]             Date of Birth: 07-12-1975           MRN: [id number]             PCP: Helane Rima, MD Referring: No ref. provider found Visit Date: 07/28/2016 Occupation: _0 @    Subjective:  Pain in joints.   History of Present Illness: [name] is a 41 y.o. female with history of autoimmune disease she returns today after her last visit 05/08/2014. She states she was on Plaquenil until November 2017. Then she ran out of the medication and could not come back for the follow-up visits. She states over time she has developed increased fatigue, hair loss, increased joint pain and malar rash. She continues to have sicca symptoms. She has not had any recent oral ulcers. She's been having pain and discomfort in her multiple joints including her  bilateral hip joints and knee joints. She denies any joint swelling.  Activities of Daily Living:  Patient reports morning stiffness for 15 minutes.   Patient Reports nocturnal pain.  Difficulty dressing/grooming: Denies Difficulty climbing stairs: Reports Difficulty getting out of chair: Denies Difficulty using hands for taps, buttons, cutlery, and/or writing: Denies   Review of Systems  Constitutional: Positive for fatigue. Negative for night sweats, weight gain, weight loss and weakness.  HENT: Positive for mouth dryness. Negative for mouth sores, trouble swallowing, trouble swallowing and nose dryness.   Eyes: Positive for dryness. Negative for pain, redness and visual disturbance.  Respiratory: Negative for cough, shortness of breath and difficulty breathing.   Cardiovascular: Negative for chest pain, palpitations, hypertension, irregular heartbeat and swelling in legs/feet.  Gastrointestinal: Negative for blood in stool, constipation and diarrhea.  Endocrine: Negative for increased urination.  Genitourinary: Negative for vaginal dryness.  Musculoskeletal: Positive for arthralgias,  joint pain and morning stiffness. Negative for joint swelling, myalgias, muscle weakness, muscle tenderness and myalgias.  Skin: Positive for rash, hair loss and sensitivity to sunlight. Negative for color change, skin tightness and ulcers.  Allergic/Immunologic: Negative for susceptible to infections.  Neurological: Negative for dizziness, memory loss and night sweats.  Hematological: Negative for swollen glands.  Psychiatric/Behavioral: Positive for depressed mood and sleep disturbance. The patient is nervous/anxious.     PMFS History:  Patient Active Problem List   Diagnosis Date Noted  . 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 malar rash, history of oral ulcers and sicca symptoms 07/22/2016  . High risk medication use 07/22/2016  . History of hypothyroidism 07/22/2016  . History of gastroesophageal reflux (GERD) 07/22/2016  . Osteopenia of multiple sites 07/22/2016  . History of depression 07/22/2016    Past Medical History:  Diagnosis Date  . Abnormal Pap smear 1996  . Asthma   . Dyspareunia 2002  . Endometriosis 2004  . H/O amenorrhea 2009  . H/O candidiasis   . H/O cervicitis 2002  . H/O dysmenorrhea 2004  . H/O tinea cruris 2002  . H/O varicella   . H/O vulvovaginitis 2002  . H/O: menorrhagia 2003  . Hx: UTI (urinary tract infection)   . Hypothyroidism   . Libido, decreased 2002  . Menses, irregular 2004  . Ovarian cyst, left 2008  . PCB (post coital bleeding) 2003  . Pelvic pain in female 2007    Family History  Problem Relation Age of Onset  . Asthma Mother   . Diabetes Mother   .  Depression Mother   . Asthma Father   . Heart disease Brother        Heart Murmur  . Arthritis Maternal Grandmother   . Cancer Maternal Grandmother        Breast & liver  . Depression Maternal Grandmother   . Heart disease Maternal Grandfather        MI  . Asthma Maternal Grandfather   . Diabetes Maternal Grandfather   . Kidney disease  Maternal Grandfather        Dialysis   Past Surgical History:  Procedure Laterality Date  . BREAST SURGERY    . NOVASURE ABLATION    . TUBAL LIGATION    . WISDOM TOOTH EXTRACTION  [id number]   Social History   Social History Narrative  . No narrative on file     Objective: Vital Signs: BP 110/73   Pulse 85   Resp 14   Ht _0  (1.549 m)   Wt 153 lb (69.4 kg)   BMI 28.91 kg/m    Physical Exam  Constitutional: She is oriented to person, place, and time. She appears well-developed and well-nourished.  HENT:  Head: Normocephalic and atraumatic.  Eyes: Conjunctivae and EOM are normal.  Neck: Normal range of motion.  Cardiovascular: Normal rate, regular rhythm, normal heart sounds and intact distal pulses.   Pulmonary/Chest: Effort normal and breath sounds normal.  Abdominal: Soft. Bowel sounds are normal.  Lymphadenopathy:    She has no cervical adenopathy.  Neurological: She is alert and oriented to person, place, and time.  Skin: Skin is warm and dry. Capillary refill takes less than 2 seconds. Rash noted.  Facial erythema  Psychiatric: She has a normal mood and affect. Her behavior is normal.  Nursing note and vitals reviewed.    Musculoskeletal Exam: C-spine and thoracic lumbar spine good range of motion. Shoulder joints elbow joints wrist joint MCPs PIPs DIPs good range of motion with no synovitis. Hip joints knee joints ankles MTPs PIPs DIPs with good range of motion with no synovitis.  CDAI Exam: CDAI Homunculus Exam:   Joint Counts:  CDAI Tender Joint count: 0 CDAI Swollen Joint count: 0  Global Assessments:  Patient Global Assessment: 4 Provider Global Assessment: 4  CDAI Calculated Score: 8    Investigation: Findings:  Her labs from Mr. Rozann Lesches office from February 2015 shows actin mitochondrial antibody and alpha 1 anti trypsin antibodies were all negative.  Her ANA was positive, but no titer was given.  Double-strand DNA was negative.  Ro and La  antibodies were positive.  January 2015 serum iron was low.  Comprehensive metabolic panel showed elevation of AST and ALT at 40 and 52 respectively and her GFR was low at 58.  07/02/13 sed rate 27, CK normal, TSH normal  ANA positive 1:40 titer, ENA  positive Ro, positive La, complements normal ,rheumatoid factor normal, ACE level 61 , HLA-B27 not detected, G6PD normal , hep panel normal, TB Gold negative, vitamin D 36, anticardiolipin panel normal,  lupus anticoagulant not detected and beta-2 normal.    Patient states that her last eye exam was one year ago.  Imaging: No results found.  Speciality Comments: No specialty comments available.    Procedures:  No procedures performed Allergies: Sulfa drugs cross reactors   Assessment / Plan:     Visit Diagnoses: Autoimmune disease ([place]) - positive ANA, positive Ro and positive La antibody. malar rash, history of oral ulcers and sicca symptoms - she has been off Plaquenil  since November 2017. She is having recurrence of symptoms with increased fatigue malar rash sicca symptoms and arthralgias. I do not see any synovitis on examination today. She has difficulty walking and nocturnal pain due to discomfort in her joints. She wants to restart on Plaquenil. Indications side effects contraindications were revised again today. I'll call in prescription for Plaquenil 200 mg by mouth twice a day Monday to Friday total 90 days supply with one refill will be given. She's been advised to get yearly eye examination. Plan: Urinalysis, Routine w reflex microscopic, Sedimentation rate, ANA, C3 and C4, Beta-2 glycoprotein antibodies, Lupus anticoagulant panel, Cardiolipin antibodies, IgG, IgM, IgA, [id number] ENA PANEL  High risk medication use - Plaquenil/ patient has discontinued  - Plan: CBC with Differential/Platelet, COMPLETE METABOLIC PANEL WITH GFR. She will need labs again in 3 months and then every 3 months. I'llthem out to every 5 months when she has a  stable labs  Osteopenia of multiple sites - Plan: VITAMIN D 25 Hydroxy (Vit-D Deficiency, Fractures). According to patient she's been getting DEXA through her PCP.  History of hypothyroidism  History of gastroesophageal reflux (GERD)  History of depression    Orders: Orders Placed This Encounter  Procedures  . CBC with Differential/Platelet  . COMPLETE METABOLIC PANEL WITH GFR  . Urinalysis, Routine w reflex microscopic  . Sedimentation rate  . ANA  . C3 and C4  . Beta-2 glycoprotein antibodies  . Lupus anticoagulant panel  . Cardiolipin antibodies, IgG, IgM, IgA  . VITAMIN D 25 Hydroxy (Vit-D Deficiency, Fractures)  . [id number] ENA PANEL   No orders of the defined types were placed in this encounter.   Face-to-face time spent with patient was 30 minutes. 50% of time was spent in counseling and coordination of care.  Follow-Up Instructions: Return in about 3 months (around 10/28/2016) for Autoimmune disease.   Bo Merino, MD  Note - This record has been created using Editor, commissioning.  Chart creation errors have been sought, but may not always  have been located. Such creation errors do not reflect on  the standard of medical care.

## 2016-07-28 ENCOUNTER — Encounter: Payer: Self-pay | Admitting: Rheumatology

## 2016-07-28 ENCOUNTER — Ambulatory Visit (INDEPENDENT_AMBULATORY_CARE_PROVIDER_SITE_OTHER): Payer: 59 | Admitting: Rheumatology

## 2016-07-28 VITALS — BP 110/73 | HR 85 | Resp 14 | Ht 61.0 in | Wt 153.0 lb

## 2016-07-28 DIAGNOSIS — D8989 Other specified disorders involving the immune mechanism, not elsewhere classified: Secondary | ICD-10-CM | POA: Diagnosis not present

## 2016-07-28 DIAGNOSIS — Z79899 Other long term (current) drug therapy: Secondary | ICD-10-CM

## 2016-07-28 DIAGNOSIS — Z8659 Personal history of other mental and behavioral disorders: Secondary | ICD-10-CM

## 2016-07-28 DIAGNOSIS — M8589 Other specified disorders of bone density and structure, multiple sites: Secondary | ICD-10-CM

## 2016-07-28 DIAGNOSIS — Z8719 Personal history of other diseases of the digestive system: Secondary | ICD-10-CM | POA: Diagnosis not present

## 2016-07-28 DIAGNOSIS — M359 Systemic involvement of connective tissue, unspecified: Secondary | ICD-10-CM

## 2016-07-28 DIAGNOSIS — Z8639 Personal history of other endocrine, nutritional and metabolic disease: Secondary | ICD-10-CM

## 2016-07-28 LAB — CBC WITH DIFFERENTIAL/PLATELET
BASOS PCT: 1 %
Basophils Absolute: 40 cells/uL (ref 0–200)
Eosinophils Absolute: 320 cells/uL (ref 15–500)
Eosinophils Relative: 8 %
HCT: 37.1 % (ref 35.0–45.0)
Hemoglobin: 12.3 g/dL (ref 11.7–15.5)
Lymphocytes Relative: 35 %
Lymphs Abs: 1400 cells/uL (ref 850–3900)
MCH: 28 pg (ref 27.0–33.0)
MCHC: 33.2 g/dL (ref 32.0–36.0)
MCV: 84.3 fL (ref 80.0–100.0)
MONOS PCT: 7 %
MPV: 10.1 fL (ref 7.5–12.5)
Monocytes Absolute: 280 cells/uL (ref 200–950)
NEUTROS ABS: 1960 {cells}/uL (ref 1500–7800)
Neutrophils Relative %: 49 %
PLATELETS: 244 10*3/uL (ref 140–400)
RBC: 4.4 MIL/uL (ref 3.80–5.10)
RDW: 13.4 % (ref 11.0–15.0)
WBC: 4 10*3/uL (ref 3.8–10.8)

## 2016-07-28 LAB — COMPLETE METABOLIC PANEL WITH GFR
ALT: 7 U/L (ref 6–29)
AST: 11 U/L (ref 10–30)
Albumin: 4.5 g/dL (ref 3.6–5.1)
Alkaline Phosphatase: 64 U/L (ref 33–115)
BUN: 24 mg/dL (ref 7–25)
CO2: 20 mmol/L (ref 20–31)
CREATININE: 0.68 mg/dL (ref 0.50–1.10)
Calcium: 8.9 mg/dL (ref 8.6–10.2)
Chloride: 103 mmol/L (ref 98–110)
GFR, Est African American: >89 mL/min (ref >=60)
GFR, Est Non African American: >89 mL/min (ref >=60)
Glucose, Bld: 75 mg/dL (ref 65–99)
Potassium: 3.9 mmol/L (ref 3.5–5.3)
Sodium: 139 mmol/L (ref 135–146)
Total Bilirubin: 0.3 mg/dL (ref 0.2–1.2)
Total Protein: 7.4 g/dL (ref 6.1–8.1)

## 2016-07-28 MED ORDER — HYDROXYCHLOROQUINE SULFATE 200 MG PO TABS: 200.0000 mg | ORAL_TABLET | Freq: Two times a day (BID) | ORAL | 1 refills | Status: DC

## 2016-07-28 NOTE — Progress Notes (Signed)
Pharmacy Note  Subjective: Patient presents today to the [facility] to see Dr. Corliss Skainseveshwar.  Patient seen by the pharmacist for counseling on hydroxychloroquine.    Objective: CMP Latest Ref Rng & Units 10/15/2013 01/19/2012 08/16/2007  Glucose 70 - 99 mg/dL 161(W107(H) 93 960(A100(H)  BUN 6 - 23 mg/dL 18 15 14   Creatinine 0.50 - 1.10 mg/dL 5.401.00 9.811.00 1.910.80  Sodium 137 - 147 mEq/L 138 139 137  Potassium 3.7 - 5.3 mEq/L 4.0 3.6 3.9  Chloride 96 - 112 mEq/L 99 105 106  CO2 19 - 32 mEq/L 24 22 24   Calcium 8.4 - 10.5 mg/dL 9.6 9.4 8.9  Total Protein 6.0 - 8.3 g/dL 4.7(W8.5(H) 8.3 7.6  Total Bilirubin 0.3 - 1.2 mg/dL 0.4 0.5 0.6  Alkaline Phos 39 - 117 U/L 84 93 84  AST 0 - 37 U/L 15 46(H) 22  ALT 0 - 35 U/L 14 58(H) 22    CBC    Component Value Date/Time   WBC 5.7 10/15/2013 0015   RBC 4.76 10/15/2013 0015   HGB 12.4 10/15/2013 0015   HCT 38.6 10/15/2013 0015   PLT 270 10/15/2013 0015   MCV 81.1 10/15/2013 0015   MCH 26.1 10/15/2013 0015   MCHC 32.1 10/15/2013 0015   RDW 14.6 10/15/2013 0015   LYMPHSABS 1.6 10/15/2013 0015   MONOABS 0.5 10/15/2013 0015   EOSABS 0.3 10/15/2013 0015   BASOSABS 0.0 10/15/2013 0015    Assessment/Plan: Patient was prescribed hydroxychloroquine 200 mg BID Monday through Friday.  Patient was counseled on the purpose, proper use, and adverse effects of hydroxychloroquine including nausea/diarrhea, skin rash, headaches, and sun sensitivity.  Discussed importance of annual eye exams while on hydroxychloroquine to monitor to ocular toxicity and discussed importance of frequent laboratory monitoring.  Provided patient with eye exam form for baseline ophthalmologic exam and standing lab instructions.  Provided patient with educational materials on hydroxychloroquine and answered all questions.  Patient consented to hydroxychloroquine.  Will upload consent in the media tab.    Lilla Shookachel Mila Pair, Pharm.D., BCPS Clinical Pharmacist Pager: [phone] Phone:  [phone] 07/28/2016 2:38 PM

## 2016-07-28 NOTE — Patient Instructions (Addendum)
Standing Labs We placed an order today for your standing lab work.    Please come back and get your standing labs in October and every 3 months  We have open lab Monday through Friday from 8:30-11:30 AM and 1:30-4 PM at the office of Dr. Pollyann SavoyShaili Garek Schuneman.   The office is located at [address], [address], [place], [place] [address] No appointment is necessary.   Labs are drawn by [facility].  You may receive a bill from [facility] for your lab work. If you have any questions regarding directions or hours of operation,  please call [phone].     Hydroxychloroquine tablets What is this medicine? HYDROXYCHLOROQUINE (hye drox ee KLOR oh kwin) is used to treat rheumatoid arthritis and systemic lupus erythematosus. It is also used to treat malaria. This medicine may be used for other purposes; ask your health care provider or pharmacist if you have questions. COMMON BRAND NAME(S): Plaquenil, Quineprox What should I tell my health care provider before I take this medicine? They need to know if you have any of these conditions: -diabetes -eye disease, vision problems -G6PD deficiency -history of blood diseases -history of irregular heartbeat -if you often drink alcohol -kidney disease -liver disease -porphyria -psoriasis -seizures -an unusual or allergic reaction to chloroquine, hydroxychloroquine, other medicines, foods, dyes, or preservatives -pregnant or trying to get pregnant -breast-feeding How should I use this medicine? Take this medicine by mouth with a glass of water. Follow the directions on the prescription label. Avoid taking antacids within 4 hours of taking this medicine. It is best to separate these medicines by at least 4 hours. Do not cut, crush or chew this medicine. You can take it with or without food. If it upsets your stomach, take it with food. Take your medicine at regular intervals. Do not take your medicine more often than directed. Take all of your medicine  as directed even if you think you are better. Do not skip doses or stop your medicine early. Talk to your pediatrician regarding the use of this medicine in children. While this drug may be prescribed for selected conditions, precautions do apply. Overdosage: If you think you have taken too much of this medicine contact a poison control center or emergency room at once. NOTE: This medicine is only for you. Do not share this medicine with others. What if I miss a dose? If you miss a dose, take it as soon as you can. If it is almost time for your next dose, take only that dose. Do not take double or extra doses. What may interact with this medicine? Do not take this medicine with any of the following medications: -cisapride -dofetilide -dronedarone -live virus vaccines -penicillamine -pimozide -thioridazine -ziprasidone This medicine may also interact with the following medications: -ampicillin -antacids -cimetidine -cyclosporine -digoxin -medicines for diabetes, like insulin, glipizide, glyburide -medicines for seizures like carbamazepine, phenobarbital, phenytoin -mefloquine -methotrexate -other medicines that prolong the QT interval (cause an abnormal heart rhythm) -praziquantel This list may not describe all possible interactions. Give your health care provider a list of all the medicines, herbs, non-prescription drugs, or dietary supplements you use. Also tell them if you smoke, drink alcohol, or use illegal drugs. Some items may interact with your medicine. What should I watch for while using this medicine? Tell your doctor or healthcare professional if your symptoms do not start to get better or if they get worse. Avoid taking antacids within 4 hours of taking this medicine. It is best to  separate these medicines by at least 4 hours. Tell your doctor or health care professional right away if you have any change in your eyesight. Your vision and blood may be tested before and  during use of this medicine. This medicine can make you more sensitive to the sun. Keep out of the sun. If you cannot avoid being in the sun, wear protective clothing and use sunscreen. Do not use sun lamps or tanning beds/booths. What side effects may I notice from receiving this medicine? Side effects that you should report to your doctor or health care professional as soon as possible: -allergic reactions like skin rash, itching or hives, swelling of the face, lips, or tongue -changes in vision -decreased hearing or ringing of the ears -redness, blistering, peeling or loosening of the skin, including inside the mouth -seizures -sensitivity to light -signs and symptoms of a dangerous change in heartbeat or heart rhythm like chest pain; dizziness; fast or irregular heartbeat; palpitations; feeling faint or lightheaded, falls; breathing problems -signs and symptoms of liver injury like dark yellow or brown urine; general ill feeling or flu-like symptoms; light-colored stools; loss of appetite; nausea; right upper belly pain; unusually weak or tired; yellowing of the eyes or skin -signs and symptoms of low blood sugar such as feeling anxious; confusion; dizziness; increased hunger; unusually weak or tired; sweating; shakiness; cold; irritable; headache; blurred vision; fast heartbeat; loss of consciousness -uncontrollable head, mouth, neck, arm, or leg movements Side effects that usually do not require medical attention (report to your doctor or health care professional if they continue or are bothersome): -anxious -diarrhea -dizziness -hair loss -headache -irritable -loss of appetite -nausea, vomiting -stomach pain This list may not describe all possible side effects. Call your doctor for medical advice about side effects. You may report side effects to FDA at 1-800-FDA-1088. Where should I keep my medicine? Keep out of the reach of children. In children, this medicine can cause overdose  with small doses. Store at room temperature between 15 and 30 degrees C (59 and 86 degrees F). Protect from moisture and light. Throw away any unused medicine after the expiration date. NOTE: This sheet is a summary. It may not cover all possible information. If you have questions about this medicine, talk to your doctor, pharmacist, or health care provider.  2018 Elsevier/Gold Standard (2015-08-05 14:16:15)

## 2016-07-29 LAB — URINALYSIS, ROUTINE W REFLEX MICROSCOPIC
Bilirubin Urine: NEGATIVE
Glucose, UA: NEGATIVE
Hgb urine dipstick: NEGATIVE
Ketones, ur: NEGATIVE
Leukocytes, UA: NEGATIVE
NITRITE: NEGATIVE
Protein, ur: NEGATIVE
SPECIFIC GRAVITY, URINE: 1.028 (ref 1.001–1.035)
pH: 5.5 (ref 5.0–8.0)

## 2016-07-29 LAB — CP5000020 ENA PANEL
DS DNA AB: 1 [IU]/mL
ENA SM AB SER-ACNC: NEGATIVE
Ribonucleic Protein(ENA) Antibody, IgG: <1
SSA (Ro) (ENA) Antibody, IgG: 3.2 — ABNORMAL HIGH
SSB (LA) (ENA) ANTIBODY, IGG: POSITIVE — AB
Scleroderma (Scl-70) (ENA) Antibody, IgG: <1

## 2016-07-29 LAB — C3 AND C4
C3 Complement: 111 mg/dL (ref 83–193)
C4 Complement: 22 mg/dL (ref 15–57)

## 2016-07-29 LAB — ANA: ANA: NEGATIVE

## 2016-07-29 LAB — SEDIMENTATION RATE: Sed Rate: 9 mm/hr (ref 0–20)

## 2016-07-29 LAB — VITAMIN D 25 HYDROXY (VIT D DEFICIENCY, FRACTURES): Vit D, 25-Hydroxy: 41 ng/mL (ref 30–100)

## 2016-07-30 LAB — RFX PTT-LA W/RFX TO HEX PHASE CONF: PTT-LA Screen: 40 s (ref <=40)

## 2016-07-30 LAB — RFX DRVVT SCR W/RFLX CONF 1:1 MIX: DRVVT SCREEN: 35 s (ref <=45)

## 2016-07-30 LAB — LUPUS ANTICOAGULANT PANEL

## 2016-08-01 LAB — CARDIOLIPIN ANTIBODIES, IGG, IGM, IGA
Anticardiolipin IgA: <11 [APL'U]
Anticardiolipin IgG: <14 [GPL'U]
Anticardiolipin IgM: <12 [MPL'U]

## 2016-08-03 ENCOUNTER — Telehealth: Payer: Self-pay | Admitting: Rheumatology

## 2016-08-03 LAB — BETA-2 GLYCOPROTEIN ANTIBODIES
Beta-2 Glyco I IgG: <9 SGU (ref <=20)
Beta-2-Glycoprotein I IgA: <9 SAU (ref <=20)

## 2016-08-03 NOTE — Telephone Encounter (Signed)
Patient calling for lab results. Patient waiting to state new medication. Please call patient to advise.

## 2016-08-03 NOTE — Telephone Encounter (Signed)
Patient advised her labs have not resulted yet. Patient advised we would contact her once the lab result. Advised patient that I would have the lab tech double check on the labs for me.

## 2016-08-03 NOTE — Progress Notes (Signed)
Positive Ro and La Ab . Rest of the labs are normal. Pt. Should stay on PLQ as discussed during the last visit.

## 2016-08-05 ENCOUNTER — Telehealth: Payer: Self-pay | Admitting: Rheumatology

## 2016-08-05 NOTE — Telephone Encounter (Signed)
Patient advised to check with pharmacy as prescription was sent to the pharmacy on 07/28/16. Patient will do so and call if there are any problems.

## 2016-08-05 NOTE — Telephone Encounter (Signed)
Patient states she saw where lab results have come back, and needs a refill on Plaquenil. [name] [address].

## 2016-10-18 NOTE — Progress Notes (Signed)
Office Visit Note  Patient: [name]             Date of Birth: May 30, 1975           MRN: [id number]             PCP: Devra Dopp, MD Referring: Devra Dopp, MD Visit Date: 10/31/2016 Occupation: @    Subjective:  Medication management.   History of Present Illness: [name] is a 41 y.o. female with history of autoimmune disease. She states she has been doing fairly well on current regimen. She's not having much problems with sicca symptoms or rash or arthralgias. She has not had any recent oral ulcers. She's been tolerating Plaquenil well. She states she had bone density done through PCP which was consistent with osteopenia. She has been having some discomfort in the right lower back and it radiates down into her neck area. She states she's to get massages on a regular basis and recently she could not get massage.  Activities of Daily Living:  Patient reports morning stiffness for 5 minutes.   Patient Denies nocturnal pain.  Difficulty dressing/grooming: Denies Difficulty climbing stairs: Denies Difficulty getting out of chair: Denies Difficulty using hands for taps, buttons, cutlery, and/or writing: Denies   Review of Systems  Constitutional: Positive for fatigue. Negative for night sweats, weight gain, weight loss and weakness.  HENT: Negative for mouth sores, trouble swallowing, trouble swallowing, mouth dryness and nose dryness.   Eyes: Negative for pain, redness, visual disturbance and dryness.  Respiratory: Negative for cough, shortness of breath and difficulty breathing.   Cardiovascular: Negative for chest pain, palpitations, hypertension, irregular heartbeat and swelling in legs/feet.  Gastrointestinal: Negative for blood in stool, constipation and diarrhea.  Endocrine: Negative for increased urination.  Genitourinary: Negative for vaginal dryness.  Musculoskeletal: Positive for morning stiffness. Negative for arthralgias, joint pain, joint  swelling, myalgias, muscle weakness, muscle tenderness and myalgias.  Skin: Positive for rash. Negative for color change, hair loss, skin tightness, ulcers and sensitivity to sunlight.       Malar rash  Allergic/Immunologic: Negative for susceptible to infections.  Neurological: Negative for dizziness, memory loss and night sweats.  Hematological: Negative for swollen glands.  Psychiatric/Behavioral: Positive for depressed mood. Negative for sleep disturbance. The patient is not nervous/anxious.     PMFS History:  Patient Active Problem List   Diagnosis Date Noted  . 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 malar rash, history of oral ulcers and sicca symptoms 07/22/2016  . High risk medication use 07/22/2016  . History of hypothyroidism 07/22/2016  . History of gastroesophageal reflux (GERD) 07/22/2016  . Osteopenia of multiple sites 07/22/2016  . History of depression 07/22/2016    Past Medical History:  Diagnosis Date  . Abnormal Pap smear 1996  . Asthma   . Dyspareunia 2002  . Endometriosis 2004  . H/O amenorrhea 2009  . H/O candidiasis   . H/O cervicitis 2002  . H/O dysmenorrhea 2004  . H/O tinea cruris 2002  . H/O varicella   . H/O vulvovaginitis 2002  . H/O: menorrhagia 2003  . Hx: UTI (urinary tract infection)   . Hypothyroidism   . Libido, decreased 2002  . Menses, irregular 2004  . Ovarian cyst, left 2008  . PCB (post coital bleeding) 2003  . Pelvic pain in female 2007    Family History  Problem Relation Age of Onset  . Asthma Mother   . Diabetes Mother   .  Depression Mother   . Asthma Father   . Heart disease Brother        Heart Murmur  . Arthritis Maternal Grandmother   . Cancer Maternal Grandmother        Breast & liver  . Depression Maternal Grandmother   . Heart disease Maternal Grandfather        MI  . Asthma Maternal Grandfather   . Diabetes Maternal Grandfather   . Kidney disease Maternal Grandfather         Dialysis   Past Surgical History:  Procedure Laterality Date  . BREAST SURGERY    . NOVASURE ABLATION    . TUBAL LIGATION    . WISDOM TOOTH EXTRACTION  [id number]   Social History   Social History Narrative  . No narrative on file     Objective: Vital Signs: BP 105/78 (BP Location: Left Arm, Patient Position: Sitting, Cuff Size: Normal)   Pulse 76   Resp 14   Ht  (1.549 m)   Wt 162 lb (73.5 kg)   BMI 30.61 kg/m    Physical Exam  Constitutional: She is oriented to person, place, and time. She appears well-developed and well-nourished.  HENT:  Head: Normocephalic and atraumatic.  Eyes: Conjunctivae and EOM are normal.  Neck: Normal range of motion.  Cardiovascular: Normal rate, regular rhythm, normal heart sounds and intact distal pulses.   Pulmonary/Chest: Effort normal and breath sounds normal.  Abdominal: Soft. Bowel sounds are normal.  Lymphadenopathy:    She has no cervical adenopathy.  Neurological: She is alert and oriented to person, place, and time.  Skin: Skin is warm and dry. Capillary refill takes less than 2 seconds.  Mild malar rash  Psychiatric: She has a normal mood and affect. Her behavior is normal.  Nursing note and vitals reviewed.    Musculoskeletal Exam: C-spine and thoracic lumbar spine good range of motion. She has some discomfort range of motion of her lumbar spine which was in the paraspinal region. No radiculopathy was noted. The straight-leg raise test was negative. Shoulder joints, elbow joints, wrist joints, MCPs PIPs DIPs with good range of motion with no synovitis. Hip joints knee joints ankles MTPs PIPs DIPs are good range of motion with no synovitis.  CDAI Exam: CDAI Homunculus Exam:   Joint Counts:  CDAI Tender Joint count: 0 CDAI Swollen Joint count: 0  Global Assessments:  Patient Global Assessment: 3 Provider Global Assessment: 3  CDAI Calculated Score: 6    Investigation: No additional findings.PLQ eye exam:  09/2016 CBC Latest Ref Rng & Units 07/28/2016 10/15/2013 01/19/2012  WBC 3.8 - 10.8 K/uL 4.0 5.7 5.7  Hemoglobin 11.7 - 15.5 g/dL 54.0 98.1 19.1  Hematocrit 35.0 - 45.0 % 37.1 38.6 43.3  Platelets 140 - 400 K/uL 244 270 269   CMP Latest Ref Rng & Units 07/28/2016 10/15/2013 01/19/2012  Glucose 65 - 99 mg/dL 75 478(G) 93  BUN 7 - 25 mg/dL Creatinine 0.50 - 1.10 mg/dL 9.56 2.13 0.86  Sodium 135 - 146 mmol/L 139 138 139  Potassium 3.5 - 5.3 mmol/L 3.9 4.0 3.6  Chloride 98 - 110 mmol/L 103 99 105  CO2 20 - 31 mmol/L Calcium 8.6 - 10.2 mg/dL 8.9 9.6 9.4  Total Protein 6.1 - 8.1 g/dL 7.4 5.7(Q) 8.3  Total Bilirubin 0.2 - 1.2 mg/dL 0.3 0.4 0.5  Alkaline Phos 33 - 115 U/L 64 84 93  AST 10 - 30 U/L  11 15 46(H)  ALT 6 - 29 U/L 7 14 58(H)  UA negative, ANA negative, +ro, +La, (dsDNA, RNP,Sm,, SCl-70 negative), acl, b2, LA negative, C3 and C4 normal, ESR9, Vit D 41  Imaging: No results found.  Speciality Comments: No specialty comments available.    Procedures:  No procedures performed Allergies: Sulfa drugs cross reactors   Assessment / Plan:     Visit Diagnoses: Autoimmune disease (HCC) - positive ANA, positive Ro and positive La antibody. malar rash, history of oral ulcers and sicca symptoms.Patient is clinically doing well. Her sicca symptoms are well-controlled with the medication. She still has mild malar rash. She has not had any recent joint pain and swelling.  High risk medication use - PLQ 200 mg po bid M-F, resumed on 07/28/2016 due to worsening of symptoms. Patient syopped PLQ herself in 11/2015.eye exam: 09/2016 - Plan: CBC with Differential/Platelet, COMPLETE METABOLIC PANEL WITH GFR today and then every 5 months.  Acute midline low back pain without sciatica: Appears to be muscular. Given her a handout on back exercises. If her symptoms persist she supposed to notify me.  Osteopenia of multiple sites - According to patient she's been getting DEXA through her  PCP.  Other medical problems are listed as follows:  History of gastroesophageal reflux (GERD)  History of depression  History of hypothyroidism  High risk medication use - Plaquenil/ patient has discontinued  - Plan: CBC with Differential/Platelet, COMPLETE METABOLIC PANEL WITH GFR    Orders: No orders of the defined types were placed in this encounter.  No orders of the defined types were placed in this encounter.    Follow-Up Instructions: Return in about 5 months (around 03/31/2017) for Autoimmune disease.   Pollyann Savoy, MD  Note - This record has been created using Animal nutritionist.  Chart creation errors have been sought, but may not always  have been located. Such creation errors do not reflect on  the standard of medical care.

## 2016-10-31 ENCOUNTER — Encounter: Payer: Self-pay | Admitting: Rheumatology

## 2016-10-31 ENCOUNTER — Ambulatory Visit (INDEPENDENT_AMBULATORY_CARE_PROVIDER_SITE_OTHER): Payer: 59 | Admitting: Rheumatology

## 2016-10-31 VITALS — BP 105/78 | HR 76 | Resp 14 | Ht 61.0 in | Wt 162.0 lb

## 2016-10-31 DIAGNOSIS — Z8719 Personal history of other diseases of the digestive system: Secondary | ICD-10-CM

## 2016-10-31 DIAGNOSIS — Z8659 Personal history of other mental and behavioral disorders: Secondary | ICD-10-CM

## 2016-10-31 DIAGNOSIS — D8989 Other specified disorders involving the immune mechanism, not elsewhere classified: Secondary | ICD-10-CM | POA: Diagnosis not present

## 2016-10-31 DIAGNOSIS — Z79899 Other long term (current) drug therapy: Secondary | ICD-10-CM

## 2016-10-31 DIAGNOSIS — Z8639 Personal history of other endocrine, nutritional and metabolic disease: Secondary | ICD-10-CM

## 2016-10-31 DIAGNOSIS — M8589 Other specified disorders of bone density and structure, multiple sites: Secondary | ICD-10-CM | POA: Diagnosis not present

## 2016-10-31 DIAGNOSIS — M359 Systemic involvement of connective tissue, unspecified: Secondary | ICD-10-CM

## 2016-10-31 DIAGNOSIS — M545 Low back pain, unspecified: Secondary | ICD-10-CM

## 2016-10-31 LAB — CBC WITH DIFFERENTIAL/PLATELET
BASOS PCT: 0.5 %
Basophils Absolute: 20 cells/uL (ref 0–200)
EOS PCT: 6.3 %
Eosinophils Absolute: 252 cells/uL (ref 15–500)
HCT: 35 % (ref 35.0–45.0)
Hemoglobin: 11.7 g/dL (ref 11.7–15.5)
Lymphs Abs: 1332 cells/uL (ref 850–3900)
MCH: 27.9 pg (ref 27.0–33.0)
MCHC: 33.4 g/dL (ref 32.0–36.0)
MCV: 83.5 fL (ref 80.0–100.0)
MPV: 10.2 fL (ref 7.5–12.5)
Monocytes Relative: 8.8 %
Neutro Abs: 2044 cells/uL (ref 1500–7800)
Neutrophils Relative %: 51.1 %
Platelets: 250 10*3/uL (ref 140–400)
RBC: 4.19 10*6/uL (ref 3.80–5.10)
RDW: 12.3 % (ref 11.0–15.0)
Total Lymphocyte: 33.3 %
WBC mixed population: 352 cells/uL (ref 200–950)
WBC: 4 10*3/uL (ref 3.8–10.8)

## 2016-10-31 LAB — COMPLETE METABOLIC PANEL WITH GFR
AG Ratio: 1.5 (calc) (ref 1.0–2.5)
ALT: 7 U/L (ref 6–29)
AST: 11 U/L (ref 10–30)
Albumin: 4.1 g/dL (ref 3.6–5.1)
Alkaline phosphatase (APISO): 56 U/L (ref 33–115)
BUN: 21 mg/dL (ref 7–25)
CO2: 30 mmol/L (ref 20–32)
Calcium: 9.1 mg/dL (ref 8.6–10.2)
Chloride: 106 mmol/L (ref 98–110)
Creat: 0.78 mg/dL (ref 0.50–1.10)
GFR, EST NON AFRICAN AMERICAN: 93 mL/min/{1.73_m2} (ref >=60)
GFR, Est African American: 108 mL/min/{1.73_m2} (ref >=60)
GLUCOSE: 86 mg/dL (ref 65–99)
Globulin: 2.8 g/dL (calc) (ref 1.9–3.7)
POTASSIUM: 4.8 mmol/L (ref 3.5–5.3)
Sodium: 142 mmol/L (ref 135–146)
Total Bilirubin: 0.3 mg/dL (ref 0.2–1.2)
Total Protein: 6.9 g/dL (ref 6.1–8.1)

## 2016-10-31 NOTE — Patient Instructions (Signed)

## 2016-11-01 NOTE — Progress Notes (Signed)
Within normal limits

## 2017-03-17 ENCOUNTER — Other Ambulatory Visit: Payer: Self-pay | Admitting: Rheumatology

## 2017-03-20 NOTE — Telephone Encounter (Signed)
Last Visit: 10/31/16 Next Visit: 04/04/17 Labs: 10/31/16 WNL PLQ Eye Exam: 09/07/16 WNL  Okay to refill per Dr. Corliss Skainseveshwar

## 2017-03-22 NOTE — Progress Notes (Signed)
Office Visit Note  Patient: [name]             Date of Birth: [birth year]           MRN: [id number]             PCP: Devra DoppHowell, Tamieka, MD Referring: Devra DoppHowell, Tamieka, MD Visit Date: 04/04/2017 Occupation: @GUAROCC @    Subjective:  Pain in both knees.   History of Present Illness: [name] is a 42 y.o. female with history of autoimmune disease.  She states she has been having pain and discomfort in her bilateral knee joints for the last 2 months.  She has difficulty climbing stairs.  She denies any joint swelling.  She has mild symptoms of dry mouth and dry eyes.  Activities of Daily Living:  Patient reports morning stiffness for 10 minute.   Patient Denies nocturnal pain.  Difficulty dressing/grooming: Denies Difficulty climbing stairs: Reports Difficulty getting out of chair: Denies Difficulty using hands for taps, buttons, cutlery, and/or writing: Denies   Review of Systems  Constitutional: Negative for fatigue, fever, night sweats, weight gain and weight loss.  HENT: Negative for ear pain, mouth sores, trouble swallowing, trouble swallowing, mouth dryness and nose dryness.   Eyes: Negative for pain, redness, visual disturbance and dryness.  Respiratory: Negative for cough, shortness of breath and difficulty breathing.   Cardiovascular: Negative for chest pain, palpitations, hypertension, irregular heartbeat and swelling in legs/feet.  Gastrointestinal: Negative for blood in stool, constipation and diarrhea.  Endocrine: Negative for increased urination.  Genitourinary: Negative for difficulty urinating and vaginal dryness.  Musculoskeletal: Positive for arthralgias, joint pain and morning stiffness. Negative for joint swelling, myalgias, muscle weakness, muscle tenderness and myalgias.  Skin: Positive for rash and hair loss. Negative for color change, skin tightness, ulcers and sensitivity to sunlight.  Allergic/Immunologic: Negative for susceptible to  infections.  Neurological: Positive for headaches. Negative for dizziness, light-headedness, numbness, memory loss, night sweats and weakness.  Hematological: Negative for bruising/bleeding tendency and swollen glands.  Psychiatric/Behavioral: Positive for sleep disturbance. Negative for depressed mood. The patient is not nervous/anxious.     PMFS History:  Patient Active Problem List   Diagnosis Date Noted  . Primary insomnia 04/04/2017  . 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 malar rash, history of oral ulcers and sicca symptoms 07/22/2016  . High risk medication use 07/22/2016  . History of hypothyroidism 07/22/2016  . History of gastroesophageal reflux (GERD) 07/22/2016  . Osteopenia of multiple sites 07/22/2016  . History of depression 07/22/2016    Past Medical History:  Diagnosis Date  . Abnormal Pap smear 1996  . Asthma   . Dyspareunia 2002  . Endometriosis 2004  . H/O amenorrhea 2009  . H/O candidiasis   . H/O cervicitis 2002  . H/O dysmenorrhea 2004  . H/O tinea cruris 2002  . H/O varicella   . H/O vulvovaginitis 2002  . H/O: menorrhagia 2003  . Hx: UTI (urinary tract infection)   . Hypothyroidism   . Libido, decreased 2002  . Menses, irregular 2004  . Ovarian cyst, left 2008  . PCB (post coital bleeding) 2003  . Pelvic pain in female 2007    Family History  Problem Relation Age of Onset  . Asthma Mother   . Diabetes Mother   . Depression Mother   . Asthma Father   . Heart disease Brother        Heart Murmur  . Arthritis Maternal Grandmother   .  Cancer Maternal Grandmother        Breast & liver  . Depression Maternal Grandmother   . Heart disease Maternal Grandfather        MI  . Asthma Maternal Grandfather   . Diabetes Maternal Grandfather   . Kidney disease Maternal Grandfather        Dialysis   Past Surgical History:  Procedure Laterality Date  . BREAST SURGERY    . GASTRIC    . NOVASURE ABLATION    .  TUBAL LIGATION    . WISDOM TOOTH EXTRACTION  [id number]   Social History   Social History Narrative  . Not on file     Objective: Vital Signs: BP 111/85 (BP Location: Left Arm, Patient Position: Sitting, Cuff Size: Normal)   Pulse 74   Resp 18   Ht 5\' 1"  (1.549 m)   Wt 175 lb (79.4 kg)   BMI 33.07 kg/m    Physical Exam  Constitutional: She is oriented to person, place, and time. She appears well-developed and well-nourished.  HENT:  Head: Normocephalic and atraumatic.  Eyes: Conjunctivae and EOM are normal.  Neck: Normal range of motion.  Cardiovascular: Normal rate, regular rhythm, normal heart sounds and intact distal pulses.  Pulmonary/Chest: Effort normal and breath sounds normal.  Abdominal: Soft. Bowel sounds are normal.  Lymphadenopathy:    She has no cervical adenopathy.  Neurological: She is alert and oriented to person, place, and time.  Skin: Skin is warm and dry. Capillary refill takes less than 2 seconds.  Psychiatric: She has a normal mood and affect. Her behavior is normal.  Nursing note and vitals reviewed.    Musculoskeletal Exam: C-spine thoracic lumbar spine good range of motion.  Shoulder joints elbow joints wrist joint MCPs PIPs DIPs were in good range of motion.  She had discomfort range of motion of bilateral knee joints.  No warmth swelling or effusion was noted.  CDAI Exam: No CDAI exam completed.    Investigation: No additional findings.PLQ eye exam: 09/07/2016  CBC Latest Ref Rng & Units 10/31/2016 07/28/2016 10/15/2013  WBC 3.8 - 10.8 Thousand/uL 4.0 4.0 5.7  Hemoglobin 11.7 - 15.5 g/dL 54.0 98.1 19.1  Hematocrit 35.0 - 45.0 % 35.0 37.1 38.6  Platelets 140 - 400 Thousand/uL 250 244 270   CMP Latest Ref Rng & Units 10/31/2016 07/28/2016 10/15/2013  Glucose 65 - 99 mg/dL 86 75 478(G)  BUN 7 - 25 mg/dL 21 24 18   Creatinine 0.50 - 1.10 mg/dL 9.56 2.13 0.86  Sodium 135 - 146 mmol/L 142 139 138  Potassium 3.5 - 5.3 mmol/L 4.8 3.9 4.0  Chloride  98 - 110 mmol/L 106 103 99  CO2 20 - 32 mmol/L 30 20 24   Calcium 8.6 - 10.2 mg/dL 9.1 8.9 9.6  Total Protein 6.1 - 8.1 g/dL 6.9 7.4 5.7(Q)  Total Bilirubin 0.2 - 1.2 mg/dL 0.3 0.3 0.4  Alkaline Phos 33 - 115 U/L - 64 84  AST 10 - 30 U/L 11 11 15   ALT 6 - 29 U/L 7 7 14    Imaging: Xr Knee 3 View Left  Result Date: 04/04/2017 Moderate lateral compartment narrowing was noted.  No chondrocalcinosis was noted.  Severe patellofemoral narrowing was noted. Impression: Moderate osteoarthritis and severe chondromalacia patella.  Xr Knee 3 View Right  Result Date: 04/04/2017 Mild/moderate lateral compartment narrowing was noted.  No chondrocalcinosis was noted.  Moderate patellofemoral narrowing was noted. Impression: Mild to moderate osteoarthritis and moderate chondromalacia patella   Speciality  Comments: PLQ eye exam: 09/07/2016 Normal. Dr. Doristine Section. Follow up in 12 months.    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 malar rash, history of oral ulcers and sicca symptoms - positive ANA, positive Ro and positive La antibody. malar rash, history of oral ulcers and sicca symptoms -she has mild sicca symptoms.  She states that she had some rash in her neck due to photosensitivity.  She has been tolerating Plaquenil well.  I will obtain following labs to monitor autoimmune disease.  Plan: Anti-DNA antibody, double-stranded, C3 and C4, Sedimentation rate, Rheumatoid factor, Serum protein electrophoresis with reflex  High risk medication use - PLQeye exam: 09/07/2016  - Plan: CBC with Differential/Platelet, COMPLETE METABOLIC PANEL WITH GFR today and every 5 months.  Chronic pain of both knees - Plan: XR KNEE 3 VIEW RIGHT, XR KNEE 3 VIEW LEFT knee joint x-ray shows mild to moderate lateral compartment narrowing and moderate to severe chondromalacia  patella.  The x-rays were discussed at length.  Weight loss diet and exercise was discussed use.  I offered physical therapy but patient declined.  I have given her a handout for exercises.  We also discussed possible cortisone or Visco supplement injections in future.  Osteopenia of multiple sites - According to patient she's been getting DEXA through her PCP. - Plan: VITAMIN D 25 Hydroxy (Vit-D Deficiency, Fractures)  History of depression  History of gastroesophageal reflux (GERD)  History of hypothyroidism  Primary insomnia    Orders: Orders Placed This Encounter  Procedures  . XR KNEE 3 VIEW RIGHT  . XR KNEE 3 VIEW LEFT  . CBC with Differential/Platelet  . COMPLETE METABOLIC PANEL WITH GFR  . Anti-DNA antibody, double-stranded  . C3 and C4  . Sedimentation rate  . VITAMIN D 25 Hydroxy (Vit-D Deficiency, Fractures)  . Rheumatoid factor  . Serum protein electrophoresis with reflex   No orders of the defined types were placed in this encounter.   Face-to-face time spent with patient was 30 minutes.  Greater than 50% of time was spent in counseling and coordination of care.  Follow-Up Instructions: Return in about 6 months (around 10/04/2017) for Autoimmune disease.   Pollyann Savoy, MD  Note - This record has been created using Animal nutritionist.  Chart creation errors have been sought, but may not always  have been located. Such creation errors do not reflect on  the standard of medical care.

## 2017-04-04 ENCOUNTER — Ambulatory Visit (INDEPENDENT_AMBULATORY_CARE_PROVIDER_SITE_OTHER): Payer: Self-pay

## 2017-04-04 ENCOUNTER — Ambulatory Visit: Payer: 59 | Admitting: Rheumatology

## 2017-04-04 ENCOUNTER — Encounter: Payer: Self-pay | Admitting: Rheumatology

## 2017-04-04 VITALS — BP 111/85 | HR 74 | Resp 18 | Ht 61.0 in | Wt 175.0 lb

## 2017-04-04 DIAGNOSIS — M25561 Pain in right knee: Secondary | ICD-10-CM

## 2017-04-04 DIAGNOSIS — Z79899 Other long term (current) drug therapy: Secondary | ICD-10-CM

## 2017-04-04 DIAGNOSIS — G8929 Other chronic pain: Secondary | ICD-10-CM | POA: Diagnosis not present

## 2017-04-04 DIAGNOSIS — Z8639 Personal history of other endocrine, nutritional and metabolic disease: Secondary | ICD-10-CM

## 2017-04-04 DIAGNOSIS — M359 Systemic involvement of connective tissue, unspecified: Secondary | ICD-10-CM

## 2017-04-04 DIAGNOSIS — D8989 Other specified disorders involving the immune mechanism, not elsewhere classified: Secondary | ICD-10-CM | POA: Diagnosis not present

## 2017-04-04 DIAGNOSIS — F5101 Primary insomnia: Secondary | ICD-10-CM | POA: Diagnosis not present

## 2017-04-04 DIAGNOSIS — M25562 Pain in left knee: Secondary | ICD-10-CM

## 2017-04-04 DIAGNOSIS — Z8659 Personal history of other mental and behavioral disorders: Secondary | ICD-10-CM | POA: Diagnosis not present

## 2017-04-04 DIAGNOSIS — M8589 Other specified disorders of bone density and structure, multiple sites: Secondary | ICD-10-CM

## 2017-04-04 DIAGNOSIS — Z8719 Personal history of other diseases of the digestive system: Secondary | ICD-10-CM | POA: Diagnosis not present

## 2017-04-04 NOTE — Patient Instructions (Addendum)
Natural anti-inflammatories  You can purchase these at [employer], [employer] or online.  . Turmeric (capsules)  . Ginger (ginger root or capsules)  . Omega 3 (Fish, flax seeds, chia seeds, walnuts, almonds)  . Tart cherry (dried or extract)   Patient should be under the care of a physician while taking these supplements. This may not be reproduced without the permission of Dr. Rhianne Soman.   Knee Exercises Ask your health care provider which exercises are safe for you. Do exercises exactly as told by your health care provider and adjust them as directed. It is normal to feel mild stretching, pulling, tightness, or discomfort as you do these exercises, but you should stop right away if you feel sudden pain or your pain gets worse.Do not begin these exercises until told by your health care provider. STRETCHING AND RANGE OF MOTION EXERCISES These exercises warm up your muscles and joints and improve the movement and flexibility of your knee. These exercises also help to relieve pain, numbness, and tingling. Exercise A: Knee Extension, Prone 1. Lie on your abdomen on a bed. 2. Place your left / right knee just beyond the edge of the surface so your knee is not on the bed. You can put a towel under your left / right thigh just above your knee for comfort. 3. Relax your leg muscles and allow gravity to straighten your knee. You should feel a stretch behind your left / right knee. 4. Hold this position for __________ seconds. 5. Scoot up so your knee is supported between repetitions. Repeat __________ times. Complete this stretch __________ times a day. Exercise B: Knee Flexion, Active  1. Lie on your back with both knees straight. If this causes back discomfort, bend your left / right knee so your foot is flat on the floor. 2. Slowly slide your left / right heel back toward your buttocks until you feel a gentle stretch in the front of your knee or thigh. 3. Hold this position  for __________ seconds. 4. Slowly slide your left / right heel back to the starting position. Repeat __________ times. Complete this exercise __________ times a day. Exercise C: Quadriceps, Prone  1. Lie on your abdomen on a firm surface, such as a bed or padded floor. 2. Bend your left / right knee and hold your ankle. If you cannot reach your ankle or pant leg, loop a belt around your foot and grab the belt instead. 3. Gently pull your heel toward your buttocks. Your knee should not slide out to the side. You should feel a stretch in the front of your thigh and knee. 4. Hold this position for __________ seconds. Repeat __________ times. Complete this stretch __________ times a day. Exercise D: Hamstring, Supine 1. Lie on your back. 2. Loop a belt or towel over the ball of your left / right foot. The ball of your foot is on the walking surface, right under your toes. 3. Straighten your left / right knee and slowly pull on the belt to raise your leg until you feel a gentle stretch behind your knee. ? Do not let your left / right knee bend while you do this. ? Keep your other leg flat on the floor. 4. Hold this position for __________ seconds. Repeat __________ times. Complete this stretch __________ times a day. STRENGTHENING EXERCISES These exercises build strength and endurance in your knee. Endurance is the ability to use your muscles for a long time, even after they get tired. Exercise   E: Quadriceps, Isometric  1. Lie on your back with your left / right leg extended and your other knee bent. Put a rolled towel or small pillow under your knee if told by your health care provider. 2. Slowly tense the muscles in the front of your left / right thigh. You should see your kneecap slide up toward your hip or see increased dimpling just above the knee. This motion will push the back of the knee toward the floor. 3. For __________ seconds, keep the muscle as tight as you can without increasing  your pain. 4. Relax the muscles slowly and completely. Repeat __________ times. Complete this exercise __________ times a day. Exercise F: Straight Leg Raises - Quadriceps 1. Lie on your back with your left / right leg extended and your other knee bent. 2. Tense the muscles in the front of your left / right thigh. You should see your kneecap slide up or see increased dimpling just above the knee. Your thigh may even shake a bit. 3. Keep these muscles tight as you raise your leg 4-6 inches (10-15 cm) off the floor. Do not let your knee bend. 4. Hold this position for __________ seconds. 5. Keep these muscles tense as you lower your leg. 6. Relax your muscles slowly and completely after each repetition. Repeat __________ times. Complete this exercise __________ times a day. Exercise G: Hamstring, Isometric 1. Lie on your back on a firm surface. 2. Bend your left / right knee approximately __________ degrees. 3. Dig your left / right heel into the surface as if you are trying to pull it toward your buttocks. Tighten the muscles in the back of your thighs to dig as hard as you can without increasing any pain. 4. Hold this position for __________ seconds. 5. Release the tension gradually and allow your muscles to relax completely for __________ seconds after each repetition. Repeat __________ times. Complete this exercise __________ times a day. Exercise H: Hamstring Curls  If told by your health care provider, do this exercise while wearing ankle weights. Begin with __________ weights. Then increase the weight by 1 lb (0.5 kg) increments. Do not wear ankle weights that are more than __________. 1. Lie on your abdomen with your legs straight. 2. Bend your left / right knee as far as you can without feeling pain. Keep your hips flat against the floor. 3. Hold this position for __________ seconds. 4. Slowly lower your leg to the starting position.  Repeat __________ times. Complete this exercise  __________ times a day. Exercise I: Squats (Quadriceps) 1. Stand in front of a table, with your feet and knees pointing straight ahead. You may rest your hands on the table for balance but not for support. 2. Slowly bend your knees and lower your hips like you are going to sit in a chair. ? Keep your weight over your heels, not over your toes. ? Keep your lower legs upright so they are parallel with the table legs. ? Do not let your hips go lower than your knees. ? Do not bend lower than told by your health care provider. ? If your knee pain increases, do not bend as low. 3. Hold the squat position for __________ seconds. 4. Slowly push with your legs to return to standing. Do not use your hands to pull yourself to standing. Repeat __________ times. Complete this exercise __________ times a day. Exercise J: Wall Slides (Quadriceps)  1. Lean your back against a smooth wall or door   while you walk your feet out 18-24 inches (46-61 cm) from it. 2. Place your feet hip-width apart. 3. Slowly slide down the wall or door until your knees bend __________ degrees. Keep your knees over your heels, not over your toes. Keep your knees in line with your hips. 4. Hold for __________ seconds. Repeat __________ times. Complete this exercise __________ times a day. Exercise K: Straight Leg Raises - Hip Abductors 1. Lie on your side with your left / right leg in the top position. Lie so your head, shoulder, knee, and hip line up. You may bend your bottom knee to help you keep your balance. 2. Roll your hips slightly forward so your hips are stacked directly over each other and your left / right knee is facing forward. 3. Leading with your heel, lift your top leg 4-6 inches (10-15 cm). You should feel the muscles in your outer hip lifting. ? Do not let your foot drift forward. ? Do not let your knee roll toward the ceiling. 4. Hold this position for __________ seconds. 5. Slowly return your leg to the starting  position. 6. Let your muscles relax completely after each repetition. Repeat __________ times. Complete this exercise __________ times a day. Exercise L: Straight Leg Raises - Hip Extensors 1. Lie on your abdomen on a firm surface. You can put a pillow under your hips if that is more comfortable. 2. Tense the muscles in your buttocks and lift your left / right leg about 4-6 inches (10-15 cm). Keep your knee straight as you lift your leg. 3. Hold this position for __________ seconds. 4. Slowly lower your leg to the starting position. 5. Let your leg relax completely after each repetition. Repeat __________ times. Complete this exercise __________ times a day. This information is not intended to replace advice given to you by your health care provider. Make sure you discuss any questions you have with your health care provider. Document Released: 11/03/2004 Document Revised: 09/14/2015 Document Reviewed: 10/26/2014 [employer] Interactive Patient Education  2018 [employer]  

## 2017-04-05 NOTE — Progress Notes (Signed)
wnl

## 2017-04-06 LAB — CBC WITH DIFFERENTIAL/PLATELET
BASOS ABS: 30 {cells}/uL (ref 0–200)
Basophils Relative: 0.8 %
EOS ABS: 148 {cells}/uL (ref 15–500)
Eosinophils Relative: 3.9 %
HCT: 36.1 % (ref 35.0–45.0)
HEMOGLOBIN: 12.2 g/dL (ref 11.7–15.5)
Lymphs Abs: 1151 cells/uL (ref 850–3900)
MCH: 28.1 pg (ref 27.0–33.0)
MCHC: 33.8 g/dL (ref 32.0–36.0)
MCV: 83.2 fL (ref 80.0–100.0)
MPV: 10.5 fL (ref 7.5–12.5)
Monocytes Relative: 8.9 %
Neutro Abs: 2132 cells/uL (ref 1500–7800)
Neutrophils Relative %: 56.1 %
Platelets: 254 10*3/uL (ref 140–400)
RBC: 4.34 10*6/uL (ref 3.80–5.10)
RDW: 12.6 % (ref 11.0–15.0)
Total Lymphocyte: 30.3 %
WBC mixed population: 338 cells/uL (ref 200–950)
WBC: 3.8 10*3/uL (ref 3.8–10.8)

## 2017-04-06 LAB — COMPLETE METABOLIC PANEL WITH GFR
AG Ratio: 1.5 (calc) (ref 1.0–2.5)
ALBUMIN MSPROF: 4.3 g/dL (ref 3.6–5.1)
ALKALINE PHOSPHATASE (APISO): 56 U/L (ref 33–115)
ALT: 8 U/L (ref 6–29)
AST: 11 U/L (ref 10–30)
BUN: 23 mg/dL (ref 7–25)
CO2: 29 mmol/L (ref 20–32)
Calcium: 9.3 mg/dL (ref 8.6–10.2)
Chloride: 103 mmol/L (ref 98–110)
Creat: 0.73 mg/dL (ref 0.50–1.10)
GFR, Est African American: 117 mL/min/{1.73_m2} (ref >=60)
GFR, Est Non African American: 101 mL/min/{1.73_m2} (ref >=60)
Globulin: 2.9 g/dL (calc) (ref 1.9–3.7)
Glucose, Bld: 83 mg/dL (ref 65–99)
Potassium: 4.1 mmol/L (ref 3.5–5.3)
Sodium: 141 mmol/L (ref 135–146)
Total Bilirubin: 0.3 mg/dL (ref 0.2–1.2)
Total Protein: 7.2 g/dL (ref 6.1–8.1)

## 2017-04-06 LAB — SEDIMENTATION RATE: SED RATE: 11 mm/h (ref 0–20)

## 2017-04-06 LAB — RHEUMATOID FACTOR: Rhuematoid fact SerPl-aCnc: <14 IU/mL (ref <14)

## 2017-04-06 LAB — PROTEIN ELECTROPHORESIS, SERUM, WITH REFLEX
ALBUMIN ELP: 4.2 g/dL (ref 3.8–4.8)
ALPHA 1: 0.3 g/dL (ref 0.2–0.3)
ALPHA 2: 0.7 g/dL (ref 0.5–0.9)
Beta 2: 0.5 g/dL (ref 0.2–0.5)
Beta Globulin: 0.5 g/dL (ref 0.4–0.6)
Gamma Globulin: 1.2 g/dL (ref 0.8–1.7)
TOTAL PROTEIN: 7.3 g/dL (ref 6.1–8.1)

## 2017-04-06 LAB — VITAMIN D 25 HYDROXY (VIT D DEFICIENCY, FRACTURES): VIT D 25 HYDROXY: 43 ng/mL (ref 30–100)

## 2017-04-06 LAB — ANTI-DNA ANTIBODY, DOUBLE-STRANDED: DS DNA AB: 1 [IU]/mL

## 2017-04-06 LAB — C3 AND C4
C3 Complement: 107 mg/dL (ref 83–193)
C4 Complement: 22 mg/dL (ref 15–57)

## 2017-04-24 ENCOUNTER — Encounter: Payer: Self-pay | Admitting: Rheumatology

## 2017-04-24 NOTE — Telephone Encounter (Signed)
On July 02, 2013 x-ray of the lumbar spine showed mild facet joint arthropathy.  No degenerative disc disease.

## 2017-05-11 ENCOUNTER — Other Ambulatory Visit: Payer: Self-pay | Admitting: *Deleted

## 2017-05-11 MED ORDER — HYDROXYCHLOROQUINE SULFATE 200 MG PO TABS: ORAL_TABLET | ORAL | 0 refills | Status: DC

## 2017-05-11 NOTE — Telephone Encounter (Signed)
Refill request received via fax  Last Visit: 04/04/17 Next Visit: 10/05/17 Labs: 04/04/17 [facility] PLQ eye exam: 09/07/2016 Normal  Okay to refill per Dr. Corliss Skains

## 2017-07-07 ENCOUNTER — Emergency Department (HOSPITAL_COMMUNITY): Payer: 59

## 2017-07-07 ENCOUNTER — Emergency Department (HOSPITAL_COMMUNITY)
Admission: EM | Admit: 2017-07-07 | Discharge: 2017-07-08 | Disposition: A | Discharge status: Home / Self Care | Payer: 59 | Attending: Emergency Medicine | Admitting: Emergency Medicine

## 2017-07-07 ENCOUNTER — Encounter (HOSPITAL_COMMUNITY): Payer: Self-pay | Admitting: *Deleted

## 2017-07-07 DIAGNOSIS — R110 Nausea: Secondary | ICD-10-CM | POA: Insufficient documentation

## 2017-07-07 DIAGNOSIS — R1031 Right lower quadrant pain: Secondary | ICD-10-CM | POA: Diagnosis not present

## 2017-07-07 DIAGNOSIS — E039 Hypothyroidism, unspecified: Secondary | ICD-10-CM | POA: Diagnosis not present

## 2017-07-07 DIAGNOSIS — Z79899 Other long term (current) drug therapy: Secondary | ICD-10-CM | POA: Insufficient documentation

## 2017-07-07 DIAGNOSIS — R109 Unspecified abdominal pain: Secondary | ICD-10-CM

## 2017-07-07 DIAGNOSIS — Z9884 Bariatric surgery status: Secondary | ICD-10-CM | POA: Insufficient documentation

## 2017-07-07 LAB — WET PREP, GENITAL
Sperm: NONE SEEN
TRICH WET PREP: NONE SEEN
YEAST WET PREP: NONE SEEN

## 2017-07-07 LAB — CBC WITH DIFFERENTIAL/PLATELET
BASOS PCT: 0 %
Basophils Absolute: 0 10*3/uL (ref 0.0–0.1)
EOS PCT: 3 %
Eosinophils Absolute: 0.2 10*3/uL (ref 0.0–0.7)
HCT: 36.6 % (ref 36.0–46.0)
Hemoglobin: 11.9 g/dL — ABNORMAL LOW (ref 12.0–15.0)
Lymphocytes Relative: 38 %
Lymphs Abs: 1.7 10*3/uL (ref 0.7–4.0)
MCH: 28.3 pg (ref 26.0–34.0)
MCHC: 32.5 g/dL (ref 30.0–36.0)
MCV: 87.1 fL (ref 78.0–100.0)
MONO ABS: 0.3 10*3/uL (ref 0.1–1.0)
MONOS PCT: 6 %
Neutro Abs: 2.4 10*3/uL (ref 1.7–7.7)
Neutrophils Relative %: 53 %
Platelets: 234 10*3/uL (ref 150–400)
RBC: 4.2 MIL/uL (ref 3.87–5.11)
RDW: 12.9 % (ref 11.5–15.5)
WBC: 4.5 10*3/uL (ref 4.0–10.5)

## 2017-07-07 LAB — URINALYSIS, ROUTINE W REFLEX MICROSCOPIC
BILIRUBIN URINE: NEGATIVE
Glucose, UA: NEGATIVE mg/dL
Hgb urine dipstick: NEGATIVE
KETONES UR: NEGATIVE mg/dL
Leukocytes, UA: NEGATIVE
NITRITE: NEGATIVE
Protein, ur: NEGATIVE mg/dL
Specific Gravity, Urine: 1.025 (ref 1.005–1.030)
pH: 6 (ref 5.0–8.0)

## 2017-07-07 MED ORDER — SODIUM CHLORIDE 0.9 % IV BOLUS
1000.0000 mL | Freq: Once | INTRAVENOUS | Status: AC
  Administered 2017-07-07: 1000 mL via INTRAVENOUS

## 2017-07-07 MED ORDER — MORPHINE SULFATE (PF) 4 MG/ML IV SOLN
4.0000 mg | Freq: Once | INTRAVENOUS | Status: AC
  Administered 2017-07-07: 4 mg via INTRAVENOUS
  Filled 2017-07-07: qty 1

## 2017-07-07 MED ORDER — ONDANSETRON HCL 4 MG/2ML IJ SOLN
4.0000 mg | Freq: Once | INTRAMUSCULAR | Status: AC
  Administered 2017-07-07: 4 mg via INTRAVENOUS
  Filled 2017-07-07: qty 2

## 2017-07-07 NOTE — ED Provider Notes (Signed)
[facility]-EMERGENCY DEPT Provider Note   CSN: [id number] Arrival date & time: 07/07/17  1849     History   Chief Complaint Chief Complaint  Patient presents with  . Groin Pain    right    HPI [name] is a 42 y.o. female.  HPI   Pt is a 42 y/o female with a h/o undifferentiated autoimmune disorder (per pt), tubal ligation, ablation, gastric sleeve surgery (2016), who presents to the ED today to be evaluated for right groin pain and RLQ abd pain that began intermittently several days ago but has worsened and become constant today. States she has had pain in the past that was similar and at that time she had a kidney stone.  Also reports nausea. Denies fevers, flank pain, vomiting, diarrhea, constipation, dysuria, frequency, urgency, vaginal discharge, vaginal bleeding, vaginal irritation. Has tried taking tylenol with no relief. Pt is monogamous and denies concern for STD.    Gastric surgeon: Dr. Clent Ridges. Has appt end of august.   Past Medical History:  Diagnosis Date  . Abnormal Pap smear 1996  . Asthma   . Dyspareunia 2002  . Endometriosis 2004  . H/O amenorrhea 2009  . H/O candidiasis   . H/O cervicitis 2002  . H/O dysmenorrhea 2004  . H/O tinea cruris 2002  . H/O varicella   . H/O vulvovaginitis 2002  . H/O: menorrhagia 2003  . Hx: UTI (urinary tract infection)   . Hypothyroidism   . Libido, decreased 2002  . Menses, irregular 2004  . Ovarian cyst, left 2008  . PCB (post coital bleeding) 2003  . Pelvic pain in female 2007    Patient Active Problem List   Diagnosis Date Noted  . Primary insomnia 04/04/2017  . 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 malar rash, history of oral ulcers and sicca symptoms 07/22/2016  . High risk medication use 07/22/2016  . History of hypothyroidism 07/22/2016  . History of gastroesophageal reflux (GERD) 07/22/2016  . Osteopenia of multiple sites 07/22/2016  . History  of depression 07/22/2016    Past Surgical History:  Procedure Laterality Date  . BREAST SURGERY    . GASTRIC    . NOVASURE ABLATION    . TUBAL LIGATION    . WISDOM TOOTH EXTRACTION  1991&1998     OB History    Gravida  3   Para  3   Term  3   Preterm      AB      Living  3     SAB      TAB      Ectopic      Multiple      Live Births  3            Home Medications    Prior to Admission medications   Medication Sig Start Date End Date Taking? Authorizing Provider  buPROPion (WELLBUTRIN XL) 300 MG 24 hr tablet Take 300 mg by mouth daily.   Yes [provider]  Calcium Carbonate-Vitamin D (CALCIUM 500 + D) 500-125 MG-UNIT TABS take 1 tablet by mouth daily   Yes [provider]  citalopram (CELEXA) 20 MG tablet Take 20 mg by mouth daily.  06/30/17  Yes [provider]  hydroxychloroquine (PLAQUENIL) 200 MG tablet TAKE ONE TABLET BY MOUTH TWICE A DAY MONDAY-FRIDAY 05/11/17  Yes Deveshwar, Janalyn Rouse, MD  levothyroxine (SYNTHROID, LEVOTHROID) 125 MCG tablet Take 125 mcg by mouth daily.   Yes  [provider]  Multiple Vitamin (THERA) TABS Take 1 tablet by mouth daily.    Yes [provider]  omeprazole (PRILOSEC) 40 MG capsule Take 40 mg by mouth daily.   Yes [provider]  TURMERIC PO Take 1 capsule by mouth daily.   Yes [provider]  zolpidem (AMBIEN) 10 MG tablet Take 1 tablet by mouth at bedtime as needed for sleep   Yes [provider]  albuterol (PROVENTIL HFA;VENTOLIN HFA) 108 (90 Base) MCG/ACT inhaler Inhale 2 puffs into the lungs every 6 (six) hours as needed for wheezing or shortness of breath.     [provider]  ciprofloxacin (CIPRO) 500 MG tablet Take 1 tablet (500 mg total) by mouth 2 (two) times daily. Patient not taking: Reported on 07/28/2016 10/15/13   Dione Booze, MD  dicyclomine (BENTYL) 20 MG tablet Take 1 tablet (20 mg total) by mouth 2 (two) times daily as needed  (cramping). Patient not taking: Reported on 07/28/2016 01/19/12   Susy Frizzle, MD  loperamide (IMODIUM) 2 MG capsule Take 1 capsule (2 mg total) by mouth 4 (four) times daily as needed for diarrhea or loose stools. Patient not taking: Reported on 10/31/2016 01/19/12   Susy Frizzle, MD  metroNIDAZOLE (FLAGYL) 500 MG tablet Take 1 tablet (500 mg total) by mouth 3 (three) times daily. Patient not taking: Reported on 07/28/2016 10/15/13   Dione Booze, MD  ondansetron (ZOFRAN) 4 MG tablet Take 1 tablet (4 mg total) by mouth every 8 (eight) hours as needed for nausea or vomiting. 07/08/17   Minard Millirons S, PA-C  oxyCODONE-acetaminophen (PERCOCET) 5-325 MG per tablet Take 1 tablet by mouth every 4 (four) hours as needed for moderate pain. Patient not taking: Reported on 07/28/2016 10/15/13   Dione Booze, MD    Family History Family History  Problem Relation Age of Onset  . Asthma Mother   . Diabetes Mother   . Depression Mother   . Asthma Father   . Heart disease Brother        Heart Murmur  . Arthritis Maternal Grandmother   . Cancer Maternal Grandmother        Breast & liver  . Depression Maternal Grandmother   . Heart disease Maternal Grandfather        MI  . Asthma Maternal Grandfather   . Diabetes Maternal Grandfather   . Kidney disease Maternal Grandfather        Dialysis    Social History Social History   Tobacco Use  . Smoking status: Never Smoker  . Smokeless tobacco: Never Used  Substance Use Topics  . Alcohol use: No  . Drug use: No     Allergies   Sulfa drugs cross reactors   Review of Systems Review of Systems  Constitutional: Negative for chills and fever.  HENT: Negative for ear pain and sore throat.   Eyes: Negative for pain and visual disturbance.  Respiratory: Negative for cough and shortness of breath.   Cardiovascular: Negative for chest pain and palpitations.  Gastrointestinal: Positive for abdominal pain and nausea. Negative for blood in  stool, constipation, diarrhea and vomiting.  Genitourinary: Positive for pelvic pain. Negative for dysuria, flank pain, frequency, hematuria and urgency.  Musculoskeletal: Negative for back pain.  Skin: Negative for color change and rash.  Neurological: Negative for dizziness, weakness, light-headedness, numbness and headaches.  All other systems reviewed and are negative.  Physical Exam Updated Vital Signs BP 125/75 (BP Location: Left Arm)   Pulse  60   Temp 98.5 F (36.9 C) (Oral)   Resp 16   SpO2 100%   Physical Exam  Constitutional: She appears well-developed and well-nourished. She appears distressed.  HENT:  Head: Normocephalic and atraumatic.  Mouth/Throat: Oropharynx is clear and moist.  Eyes: Pupils are equal, round, and reactive to light. Conjunctivae and EOM are normal. No scleral icterus.  Neck: Neck supple.  Cardiovascular: Normal rate, regular rhythm, normal heart sounds and intact distal pulses.  No murmur heard. Pulmonary/Chest: Effort normal and breath sounds normal. No stridor. No respiratory distress. She has no wheezes.  Abdominal: Soft. Bowel sounds are normal.  RLQ and right suprapubic TTP, rebounding TTP present,  +guarding. No CVA TTP.   Genitourinary:  Genitourinary Comments: Exam performed by Karrie Meres,  exam chaperoned Date: 07/08/2017 Pelvic exam: normal external genitalia without evidence of trauma. VULVA: normal appearing vulva with no masses, tenderness or lesion. VAGINA: normal appearing vagina with normal color, white discharge present CERVIX: normal appearing cervix without lesions, cervical motion tenderness absent, cervical os closed with out purulent discharge; vaginal discharge, Wet prep and DNA probe for chlamydia and GC obtained.   ADNEXA: normal adnexa in size, nontender and no masses UTERUS: uterus is normal size, shape, consistency and nontender.   Musculoskeletal: She exhibits no edema.  Neurological: She is alert.  Skin: Skin  is warm and dry.  Psychiatric: She has a normal mood and affect.  Nursing note and vitals reviewed.  ED Treatments / Results  Labs (all labs ordered are listed, but only abnormal results are displayed) Labs Reviewed  WET PREP, GENITAL - Abnormal; Notable for the following components:      Result Value   Clue Cells Wet Prep HPF POC PRESENT (*)    WBC, Wet Prep HPF POC FEW (*)    All other components within normal limits  CBC WITH DIFFERENTIAL/PLATELET - Abnormal; Notable for the following components:   Hemoglobin 11.9 (*)    All other components within normal limits  COMPREHENSIVE METABOLIC PANEL - Abnormal; Notable for the following components:   AST 14 (*)    All other components within normal limits  URINE CULTURE  LIPASE, BLOOD  URINALYSIS, ROUTINE W REFLEX MICROSCOPIC  I-STAT BETA HCG BLOOD, ED ([facility], [facility], [facility] ONLY)  GC/CHLAMYDIA PROBE AMP ([place]) NOT AT [facility]    EKG None  Radiology US Transvaginal Non-ob  Result Date: 07/08/2017 CLINICAL DATA:  Right pelvic pain for 4 days. EXAM: TRANSABDOMINAL AND TRANSVAGINAL ULTRASOUND OF PELVIS DOPPLER ULTRASOUND OF OVARIES TECHNIQUE: Both transabdominal and transvaginal ultrasound examinations of the pelvis were performed. Transabdominal technique was performed for global imaging of the pelvis including uterus, ovaries, adnexal regions, and pelvic cul-de-sac. It was necessary to proceed with endovaginal exam following the transabdominal exam to visualize the uterus and ovaries. Color and duplex Doppler ultrasound was utilized to evaluate blood flow to the ovaries. COMPARISON:  CT abdomen and pelvis 10/15/2013 FINDINGS: Uterus Measurements: 8.4 x 3.4 x 5.6 cm. Uterus is anteverted. No fibroids or other mass visualized. Endometrium Thickness: 7.9 mm.  No focal abnormality visualized. Right ovary Measurements: 2.9 x 1.8 x 2.6 cm. Normal appearance/no adnexal mass. Left ovary Measurements: 2.4 x 1.4 x 1.6 cm. Normal appearance/no adnexal mass.  Pulsed Doppler evaluation of both ovaries demonstrates normal low-resistance arterial and venous waveforms. Other findings No abnormal free fluid. IMPRESSION: Normal ultrasound appearance of the uterus and ovaries. No evidence of ovarian mass or torsion. Electronically Signed   By: Marisa Cyphers.D.  On: 07/08/2017 01:02   [place] Pelvis Complete  Result Date: 07/08/2017 CLINICAL DATA:  Right pelvic pain for 4 days. EXAM: TRANSABDOMINAL AND TRANSVAGINAL ULTRASOUND OF PELVIS DOPPLER ULTRASOUND OF OVARIES TECHNIQUE: Both transabdominal and transvaginal ultrasound examinations of the pelvis were performed. Transabdominal technique was performed for global imaging of the pelvis including uterus, ovaries, adnexal regions, and pelvic cul-de-sac. It was necessary to proceed with endovaginal exam following the transabdominal exam to visualize the uterus and ovaries. Color and duplex Doppler ultrasound was utilized to evaluate blood flow to the ovaries. COMPARISON:  CT abdomen and pelvis 10/15/2013 FINDINGS: Uterus Measurements: 8.4 x 3.4 x 5.6 cm. Uterus is anteverted. No fibroids or other mass visualized. Endometrium Thickness: 7.9 mm.  No focal abnormality visualized. Right ovary Measurements: 2.9 x 1.8 x 2.6 cm. Normal appearance/no adnexal mass. Left ovary Measurements: 2.4 x 1.4 x 1.6 cm. Normal appearance/no adnexal mass. Pulsed Doppler evaluation of both ovaries demonstrates normal low-resistance arterial and venous waveforms. Other findings No abnormal free fluid. IMPRESSION: Normal ultrasound appearance of the uterus and ovaries. No evidence of ovarian mass or torsion. Electronically Signed   By: Burman NievesWilliam  Stevens M.D.   On: 07/08/2017 01:02   Ct Abdomen Pelvis W Contrast  Result Date: 07/08/2017 CLINICAL DATA:  42 y/o  F; right groin pain and nausea for 2 days. EXAM: CT ABDOMEN AND PELVIS WITH CONTRAST TECHNIQUE: Multidetector CT imaging of the abdomen and pelvis was performed using the standard protocol  following bolus administration of intravenous contrast. CONTRAST:  100 cc Isovue-300 COMPARISON:  10/05/2013 CT abdomen and pelvis FINDINGS: Lower chest: No acute abnormality. Hepatobiliary: No focal liver abnormality is seen. No gallstones, gallbladder wall thickening, or biliary dilatation. Pancreas: Unremarkable. No pancreatic ductal dilatation or surrounding inflammatory changes. Spleen: Normal in size without focal abnormality. Adrenals/Urinary Tract: Adrenal glands are unremarkable. Kidneys are normal, without renal calculi, focal lesion, or hydronephrosis. Bladder is unremarkable. Stomach/Bowel: Partial gastrectomy postsurgical changes. Normal appendix. No evidence of bowel wall thickening, distention, or inflammatory changes. Vascular/Lymphatic: No significant vascular findings are present. No enlarged abdominal or pelvic lymph nodes. Reproductive: Uterus and bilateral adnexa are unremarkable. Other: No abdominal wall hernia or abnormality. No abdominopelvic ascites. Musculoskeletal: No acute or significant osseous findings. IMPRESSION: No acute process identified. Unremarkable CT of the abdomen and pelvis. Electronically Signed   By: Mitzi HansenLance  Furusawa-Stratton M.D.   On: 07/08/2017 01:10   [place] Art/ven Flow Abd Pelv Doppler  Result Date: 07/08/2017 CLINICAL DATA:  Right pelvic pain for 4 days. EXAM: TRANSABDOMINAL AND TRANSVAGINAL ULTRASOUND OF PELVIS DOPPLER ULTRASOUND OF OVARIES TECHNIQUE: Both transabdominal and transvaginal ultrasound examinations of the pelvis were performed. Transabdominal technique was performed for global imaging of the pelvis including uterus, ovaries, adnexal regions, and pelvic cul-de-sac. It was necessary to proceed with endovaginal exam following the transabdominal exam to visualize the uterus and ovaries. Color and duplex Doppler ultrasound was utilized to evaluate blood flow to the ovaries. COMPARISON:  CT abdomen and pelvis 10/15/2013 FINDINGS: Uterus Measurements: 8.4 x 3.4  x 5.6 cm. Uterus is anteverted. No fibroids or other mass visualized. Endometrium Thickness: 7.9 mm.  No focal abnormality visualized. Right ovary Measurements: 2.9 x 1.8 x 2.6 cm. Normal appearance/no adnexal mass. Left ovary Measurements: 2.4 x 1.4 x 1.6 cm. Normal appearance/no adnexal mass. Pulsed Doppler evaluation of both ovaries demonstrates normal low-resistance arterial and venous waveforms. Other findings No abnormal free fluid. IMPRESSION: Normal ultrasound appearance of the uterus and ovaries. No evidence of ovarian mass or torsion. Electronically Signed  By: Burman Nieves M.D.   On: 07/08/2017 01:02    Procedures Procedures (including critical care time)  Medications Ordered in ED Medications  morphine 4 MG/ML injection 4 mg (4 mg Intravenous Given 07/07/17 2350)  ondansetron (ZOFRAN) injection 4 mg (4 mg Intravenous Given 07/07/17 2347)  sodium chloride 0.9 % bolus 1,000 mL (0 mLs Intravenous Stopped 07/08/17 0135)  iopamidol (ISOVUE-300) 61 % injection 100 mL (100 mLs Intravenous Contrast Given 07/08/17 0046)  acetaminophen (TYLENOL) tablet 650 mg (650 mg Oral Given 07/08/17 0144)     Initial Impression / Assessment and Plan / ED Course  I have reviewed the triage vital signs and the nursing notes.  Pertinent labs & imaging results that were available during my care of the patient were reviewed by me and considered in my medical decision making (see chart for details).    Final Clinical Impressions(s) / ED Diagnoses   Final diagnoses:  Abdominal pain, unspecified abdominal location   Patient presenting with right lower quadrant and right prepubic abdominal pain has been intermittent all week but seemed to worsen and become constant yesterday.  Patient appears uncomfortable.  She is afebrile, her vital signs are stable.  She has right lower quadrant and right suprapubic tenderness with rebound tenderness.  She has some guarding on exam as well.  No CVA tenderness.  Pelvic exam with  no significant adnexal tenderness however given her severe suprapubic tenderness ultrasound to rule out torsion was ordered.  No cervical motion tenderness to suggest PID.  CT abdomen pelvis was also ordered to rule out appendicitis or other intra-abdominal pathology.  Her CBC is without leukocytosis.  She has mild anemia.  Her CMP and lipase negative.  Pregnancy test is negative.  UA is negative for UTI.  No hematuria.  Urine culture sent.  Wet prep is significant for clue cells and white blood cells however patient denying any vaginal discharge or odor.  Patient declines Rx for antibiotics.  GC chlamydia obtained.  Patient's ultrasound was negative for CT scan had no abnormalities.  Her abdominal pain improved with morphine.  She is able to tolerate p.o. in the ED and states that her nausea is improved.  Will DC patient home with nausea medication.  Advised to follow-up with her PCP and her surgeon for reevaluation.  Advised to return to the ER for any new or worsening symptoms in the meantime.  All questions answered and patient and her husband at bedside understand plan reasons to return immediately to ED.  ED Discharge Orders        Ordered    ondansetron (ZOFRAN) 4 MG tablet  Every 8 hours PRN     07/08/17 0156       Karrie Meres, PA-C 07/08/17 0156    Tilden Fossa, MD 07/08/17 2047

## 2017-07-07 NOTE — ED Triage Notes (Signed)
Pt complains of right groin pain and nausea x 2 days. Pt denies hematuria, dysuria, vaginal discharge, emesis, diarrhea.

## 2017-07-08 ENCOUNTER — Emergency Department (HOSPITAL_COMMUNITY): Payer: 59

## 2017-07-08 LAB — COMPREHENSIVE METABOLIC PANEL
ALBUMIN: 3.9 g/dL (ref 3.5–5.0)
ALT: 10 U/L (ref 0–44)
ANION GAP: 8 (ref 5–15)
AST: 14 U/L — ABNORMAL LOW (ref 15–41)
Alkaline Phosphatase: 50 U/L (ref 38–126)
BUN: 18 mg/dL (ref 6–20)
CHLORIDE: 106 mmol/L (ref 98–111)
CO2: 27 mmol/L (ref 22–32)
Calcium: 8.9 mg/dL (ref 8.9–10.3)
Creatinine, Ser: 0.77 mg/dL (ref 0.44–1.00)
GFR calc Af Amer: >60 mL/min (ref >60)
GFR calc non Af Amer: >60 mL/min (ref >60)
GLUCOSE: 98 mg/dL (ref 70–99)
POTASSIUM: 3.8 mmol/L (ref 3.5–5.1)
SODIUM: 141 mmol/L (ref 135–145)
TOTAL PROTEIN: 7.1 g/dL (ref 6.5–8.1)

## 2017-07-08 LAB — I-STAT BETA HCG BLOOD, ED (MC, WL, AP ONLY)

## 2017-07-08 LAB — LIPASE, BLOOD: Lipase: 37 U/L (ref 11–51)

## 2017-07-08 MED ORDER — IOPAMIDOL (ISOVUE-300) INJECTION 61%
100.0000 mL | Freq: Once | INTRAVENOUS | Status: AC | PRN
  Administered 2017-07-08: 100 mL via INTRAVENOUS

## 2017-07-08 MED ORDER — ACETAMINOPHEN 325 MG PO TABS
650.0000 mg | ORAL_TABLET | Freq: Once | ORAL | Status: AC
  Administered 2017-07-08: 650 mg via ORAL
  Filled 2017-07-08: qty 2

## 2017-07-08 MED ORDER — ONDANSETRON HCL 4 MG PO TABS: 4.0000 mg | ORAL_TABLET | Freq: Three times a day (TID) | ORAL | 0 refills | Status: DC | PRN

## 2017-07-08 MED ORDER — IOPAMIDOL (ISOVUE-300) INJECTION 61%
INTRAVENOUS | Status: AC
  Filled 2017-07-08: qty 100

## 2017-07-08 NOTE — ED Notes (Signed)
Patient transported to CT 

## 2017-07-08 NOTE — ED Notes (Signed)
Pt able to tolerate water. Given 4 oz. No reports of N/V

## 2017-07-08 NOTE — Discharge Instructions (Signed)
Please follow up with your primary care provider within 5-7 days for re-evaluation of your symptoms. If you do not have a primary care provider, information for a healthcare clinic has been provided for you to make arrangements for follow up care. Please return to the emergency department for any new or worsening symptoms. ° °

## 2017-07-09 LAB — URINE CULTURE: Culture: NO GROWTH

## 2017-07-10 LAB — GC/CHLAMYDIA PROBE AMP (~~LOC~~) NOT AT ARMC
Chlamydia: NEGATIVE
Neisseria Gonorrhea: NEGATIVE

## 2017-07-16 ENCOUNTER — Other Ambulatory Visit: Payer: Self-pay | Admitting: Rheumatology

## 2017-07-17 NOTE — Telephone Encounter (Signed)
Last Visit: 04/04/17 Next Visit: 10/05/17 Labs: 07/07/17 WNL PLQ eye exam: 09/07/2016 Normal  Okay to refill per Dr. Corliss Skainseveshwar

## 2017-08-24 ENCOUNTER — Other Ambulatory Visit: Payer: Self-pay | Admitting: Physician Assistant

## 2017-09-21 NOTE — Progress Notes (Signed)
Office Visit Note  Patient: [name]             Date of Birth: 01-08-1975           MRN: [id number]             PCP: Devra Dopp, MD Referring: Devra Dopp, MD Visit Date: 10/05/2017 Occupation: @GUAROCC @  Subjective:  Right heel pain intermittently   History of Present Illness: [name] is a 42 y.o. female with history of autoimmune disease and osteoarthritis. She is taking PLQ 200 mg 1 tablet by mouth BID M-F. She has been taking Tumeric daily. She denies any recent flares.  She denies any recent rashes, raynaud's, or hair loss.  She denies any sores in mouth or nose.  She denies any sicca symptoms.  She denies any worsening fatigue, fevers, or swollen lymph nodes. She has not had any recent shortness of breath or palpitations.  She reports right heel pain intermittently.  She describes the pain as a shooting pain at times.  She reports very mild intermittent swelling in this region.  She denies any bruising.  She denies a history of plantar fasciitis or achilles tendonitis.  She denies any recent injuries or increased/change in activity.  She denies any other joint pain or joint swelling at this time.    Activities of Daily Living:  Patient reports morning stiffness for 2  minutes.   Patient Denies nocturnal pain.  Difficulty dressing/grooming: Denies Difficulty climbing stairs: Denies Difficulty getting out of chair: Denies Difficulty using hands for taps, buttons, cutlery, and/or writing: Denies  Review of Systems  Constitutional: Negative for fatigue.  HENT: Negative for mouth sores, mouth dryness and nose dryness.   Eyes: Negative for pain, visual disturbance and dryness.  Respiratory: Negative for cough, hemoptysis, shortness of breath and difficulty breathing.   Cardiovascular: Negative for chest pain, palpitations, hypertension and swelling in legs/feet.  Gastrointestinal: Negative for blood in stool, constipation and diarrhea.  Endocrine: Negative  for increased urination.  Genitourinary: Negative for painful urination.  Musculoskeletal: Positive for arthralgias, joint pain, joint swelling, myalgias, morning stiffness and myalgias. Negative for muscle weakness and muscle tenderness.  Skin: Negative for color change, pallor, rash, hair loss, nodules/bumps, skin tightness, ulcers and sensitivity to sunlight.  Allergic/Immunologic: Negative for susceptible to infections.  Neurological: Negative for dizziness, numbness, headaches and weakness.  Hematological: Negative for swollen glands.  Psychiatric/Behavioral: Positive for depressed mood and sleep disturbance. The patient is nervous/anxious.     PMFS History:  Patient Active Problem List   Diagnosis Date Noted  . Primary insomnia 04/04/2017  . 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 malar rash, history of oral ulcers and sicca symptoms 07/22/2016  . High risk medication use 07/22/2016  . History of hypothyroidism 07/22/2016  . History of gastroesophageal reflux (GERD) 07/22/2016  . Osteopenia of multiple sites 07/22/2016  . History of depression 07/22/2016    Past Medical History:  Diagnosis Date  . Abnormal Pap smear 1996  . Asthma   . Dyspareunia 2002  . Endometriosis 2004  . H/O amenorrhea 2009  . H/O candidiasis   . H/O cervicitis 2002  . H/O dysmenorrhea 2004  . H/O tinea cruris 2002  . H/O varicella   . H/O vulvovaginitis 2002  . H/O: menorrhagia 2003  . Hx: UTI (urinary tract infection)   . Hypothyroidism   . Libido, decreased 2002  . Menses, irregular 2004  . Ovarian cyst, left 2008  .  PCB (post coital bleeding) 2003  . Pelvic pain in female 2007    Family History  Problem Relation Age of Onset  . Asthma Mother   . Diabetes Mother   . Depression Mother   . Asthma Father   . Heart disease Brother        Heart Murmur  . Arthritis Maternal Grandmother   . Cancer Maternal Grandmother        Breast & liver  . Depression  Maternal Grandmother   . Heart disease Maternal Grandfather        MI  . Asthma Maternal Grandfather   . Diabetes Maternal Grandfather   . Kidney disease Maternal Grandfather        Dialysis   Past Surgical History:  Procedure Laterality Date  . BREAST SURGERY    . GASTRIC    . NOVASURE ABLATION    . TUBAL LIGATION    . WISDOM TOOTH EXTRACTION  [id number]   Social History   Social History Narrative  . Not on file    Objective: Vital Signs: BP 107/74 (BP Location: Left Arm, Patient Position: Sitting, Cuff Size: Normal)   Pulse 71   Resp 14   Ht 5\' 1"  (1.549 m)   Wt 176 lb 12.8 oz (80.2 kg)   BMI 33.41 kg/m    Physical Exam  Constitutional: She is oriented to person, place, and time. She appears well-developed and well-nourished.  HENT:  Head: Normocephalic and atraumatic.  Eyes: Conjunctivae and EOM are normal.  Neck: Normal range of motion.  Cardiovascular: Normal rate, regular rhythm, normal heart sounds and intact distal pulses.  Pulmonary/Chest: Effort normal and breath sounds normal.  Abdominal: Soft. Bowel sounds are normal.  Lymphadenopathy:    She has no cervical adenopathy.  Neurological: She is alert and oriented to person, place, and time.  Skin: Skin is warm and dry. Capillary refill takes less than 2 seconds.  Psychiatric: She has a normal mood and affect. Her behavior is normal.  Nursing note and vitals reviewed.    Musculoskeletal Exam: C-spine, thoracic spine, lumbar spine good range of motion.  No midline spinal tenderness.  No SI joint tenderness.  Shoulder joints, elbow joints, wrist joints, MCPs, PIPs, DIPs good range of motion no synovitis.  She is complete fist formation bilaterally.  Hip joints, knee joints, ankle joints, MTPs, PIPs, DIPs good range of motion with no synovitis.  No warmth or effusion of bilateral knee joints.  No knee crepitus.  No tenderness or swelling of ankle joints.  No tenderness of MTP joints.  She has tenderness on the  plantar aspect of the right heel.  No Achilles tendinitis.  CDAI Exam: CDAI Score: Not documented Patient Global Assessment: Not documented; Provider Global Assessment: Not documented Swollen: Not documented; Tender: Not documented Joint Exam   Not documented   There is currently no information documented on the homunculus. Go to the Rheumatology activity and complete the homunculus joint exam.  Investigation: No additional findings.  Imaging: No results found.  Recent Labs: Lab Results  Component Value Date   WBC 4.5 07/07/2017   HGB 11.9 (L) 07/07/2017   PLT 234 07/07/2017   NA 141 07/07/2017   K 3.8 07/07/2017   CL 106 07/07/2017   CO2 27 07/07/2017   GLUCOSE 98 07/07/2017   BUN 18 07/07/2017   CREATININE 0.77 07/07/2017   BILITOT <0.1 (L) 07/07/2017   ALKPHOS 50 07/07/2017   AST 14 (L) 07/07/2017   ALT 10 07/07/2017  PROT 7.1 07/07/2017   ALBUMIN 3.9 07/07/2017   CALCIUM 8.9 07/07/2017   GFRAA >60 07/07/2017    Speciality Comments: PLQ eye exam: 09/12/17 Normal. [facility] Follow up in 12 months.  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 malar rash, history of oral ulcers and sicca symptoms -  positive ANA, positive Ro and positive La antibody. malar rash, history of oral ulcers and sicca symptoms: She has not had any recent flares of autoimmune disease.  She is clinically doing well on Plaquenil 200 mg 1 tablet twice daily Monday through Friday.  Synovitis on exam.  No malar rash or skin lesions were noted.  No oral or nasal ulcerations noted.  She has not had any sicca symptoms recently.  She has no parotid swelling on exam.  She has not had any worsening fatigue, low-grade fevers, or palpable cervical lymphadenopathy.  She has not had any symptoms of Raynaud's.  No digital ulcerations were noted.  She  denies any shortness of breath or palpitations recently.  She has had no recent hair loss.  She is clinically doing well on Plaquenil 200 mg 1 tablet by mouth twice daily Monday through Friday only.  She does not need any refills at this time.  Autoimmune labs are within normal limits in April 2019.  We will check autoimmune labs on a yearly basis.  She is advised to notify us if she develops any new or worsening symptoms.  She will follow-up in the office in 5 months.- Plan: Urinalysis, Routine w reflex microscopic, Anti-DNA antibody, double-stranded, C3 and C4, Sedimentation rate, CBC with Differential/Platelet, COMPLETE METABOLIC PANEL WITH GFR  High risk medication use - PLQeye exam: 09/07/2016.  CBC and CMP 07/07/2017.- Plan: CBC with Differential/Platelet, COMPLETE METABOLIC PANEL WITH GFR  Primary osteoarthritis of both knees: No warmth or effusion.  Good range of motion.  No knee crepitus.  Plantar fasciitis of right foot: She has tenderness on the plantar aspect of the right heel.  She has intermittent tenderness and swelling in this region.  No swelling was noted on exam today.  No Achilles tendinitis.  She is good range of motion of the right ankle joint with no tenderness or swelling.  She describes the intermittent pain as a shooting pain.  She has not had any injuries or change in activities.  She was given a handout of exercises for plantar fasciitis.  She was advised to notify us if she continues to have intermittent discomfort.  We can try cortisone injection in the future if she continues to be symptomatic.  Osteopenia of multiple sites: She takes calcium and vitamin D on a daily basis.  Other medical conditions are listed as follows:  History of depression  History of gastroesophageal reflux (GERD)  History of hypothyroidism  Primary insomnia   Orders: Orders Placed This Encounter  Procedures  . Urinalysis, Routine w reflex microscopic  . Anti-DNA antibody, double-stranded  .  C3 and C4  . Sedimentation rate  . CBC with Differential/Platelet  . COMPLETE METABOLIC PANEL WITH GFR   No orders of the defined types were placed in this encounter.    Follow-Up Instructions: Return in about 5 months (around 03/06/2018) for Autoimmune Disease, Osteoarthritis.   Gearldine Bienenstockaylor M Renu Asby, PA-C  Note - This record has been created using Dragon software.  Chart creation errors have been sought,  but may not always  have been located. Such creation errors do not reflect on  the standard of medical care.

## 2017-10-05 ENCOUNTER — Ambulatory Visit: Payer: 59 | Admitting: Physician Assistant

## 2017-10-05 ENCOUNTER — Encounter: Payer: Self-pay | Admitting: Physician Assistant

## 2017-10-05 VITALS — BP 107/74 | HR 71 | Resp 14 | Ht 61.0 in | Wt 176.8 lb

## 2017-10-05 DIAGNOSIS — Z8639 Personal history of other endocrine, nutritional and metabolic disease: Secondary | ICD-10-CM

## 2017-10-05 DIAGNOSIS — Z79899 Other long term (current) drug therapy: Secondary | ICD-10-CM

## 2017-10-05 DIAGNOSIS — M722 Plantar fascial fibromatosis: Secondary | ICD-10-CM

## 2017-10-05 DIAGNOSIS — M8589 Other specified disorders of bone density and structure, multiple sites: Secondary | ICD-10-CM

## 2017-10-05 DIAGNOSIS — M170 Bilateral primary osteoarthritis of knee: Secondary | ICD-10-CM

## 2017-10-05 DIAGNOSIS — Z8659 Personal history of other mental and behavioral disorders: Secondary | ICD-10-CM

## 2017-10-05 DIAGNOSIS — M359 Systemic involvement of connective tissue, unspecified: Secondary | ICD-10-CM | POA: Diagnosis not present

## 2017-10-05 DIAGNOSIS — F5101 Primary insomnia: Secondary | ICD-10-CM

## 2017-10-05 DIAGNOSIS — Z8719 Personal history of other diseases of the digestive system: Secondary | ICD-10-CM

## 2017-10-05 NOTE — Patient Instructions (Signed)

## 2018-01-08 ENCOUNTER — Other Ambulatory Visit: Payer: Self-pay | Admitting: Rheumatology

## 2018-01-08 NOTE — Telephone Encounter (Signed)
Last visit: 10/05/17 Next visit: 03/08/18 Labs: 07/07/17 WNL PLQ eye exam: 09/12/17 Normal.  Patient advised she is due to update labs this week.   Okay to refill 30 day supply per Dr. Corliss Skains

## 2018-01-12 ENCOUNTER — Other Ambulatory Visit: Payer: Self-pay

## 2018-01-12 DIAGNOSIS — M359 Systemic involvement of connective tissue, unspecified: Secondary | ICD-10-CM

## 2018-01-12 DIAGNOSIS — Z79899 Other long term (current) drug therapy: Secondary | ICD-10-CM

## 2018-01-15 LAB — CBC WITH DIFFERENTIAL/PLATELET
Absolute Monocytes: 340 cells/uL (ref 200–950)
BASOS PCT: 1 %
Basophils Absolute: 41 cells/uL (ref 0–200)
EOS PCT: 4.2 %
Eosinophils Absolute: 172 cells/uL (ref 15–500)
HEMATOCRIT: 35.2 % (ref 35.0–45.0)
HEMOGLOBIN: 11.7 g/dL (ref 11.7–15.5)
LYMPHS ABS: 1197 {cells}/uL (ref 850–3900)
MCH: 27.7 pg (ref 27.0–33.0)
MCHC: 33.2 g/dL (ref 32.0–36.0)
MCV: 83.4 fL (ref 80.0–100.0)
MONOS PCT: 8.3 %
MPV: 10 fL (ref 7.5–12.5)
NEUTROS ABS: 2349 {cells}/uL (ref 1500–7800)
Neutrophils Relative %: 57.3 %
Platelets: 282 10*3/uL (ref 140–400)
RBC: 4.22 10*6/uL (ref 3.80–5.10)
RDW: 12.3 % (ref 11.0–15.0)
Total Lymphocyte: 29.2 %
WBC: 4.1 10*3/uL (ref 3.8–10.8)

## 2018-01-15 LAB — SEDIMENTATION RATE: SED RATE: 22 mm/h — AB (ref 0–20)

## 2018-01-15 LAB — URINALYSIS, ROUTINE W REFLEX MICROSCOPIC
Bilirubin Urine: NEGATIVE
GLUCOSE, UA: NEGATIVE
Hgb urine dipstick: NEGATIVE
KETONES UR: NEGATIVE
Nitrite: NEGATIVE
PH: 5.5 (ref 5.0–8.0)
Protein, ur: NEGATIVE
SPECIFIC GRAVITY, URINE: 1.026 (ref 1.001–1.03)

## 2018-01-15 LAB — COMPLETE METABOLIC PANEL WITH GFR
AG Ratio: 1.5 (calc) (ref 1.0–2.5)
ALT: 7 U/L (ref 6–29)
AST: 13 U/L (ref 10–30)
Albumin: 4.3 g/dL (ref 3.6–5.1)
Alkaline phosphatase (APISO): 55 U/L (ref 33–115)
BILIRUBIN TOTAL: 0.3 mg/dL (ref 0.2–1.2)
BUN: 24 mg/dL (ref 7–25)
CALCIUM: 9.2 mg/dL (ref 8.6–10.2)
CHLORIDE: 104 mmol/L (ref 98–110)
CO2: 29 mmol/L (ref 20–32)
Creat: 0.72 mg/dL (ref 0.50–1.10)
GFR, EST NON AFRICAN AMERICAN: 102 mL/min/{1.73_m2} (ref >=60)
GFR, Est African American: 118 mL/min/{1.73_m2} (ref >=60)
GLUCOSE: 86 mg/dL (ref 65–99)
Globulin: 2.9 g/dL (calc) (ref 1.9–3.7)
Potassium: 3.9 mmol/L (ref 3.5–5.3)
Sodium: 141 mmol/L (ref 135–146)
TOTAL PROTEIN: 7.2 g/dL (ref 6.1–8.1)

## 2018-01-15 LAB — ANTI-DNA ANTIBODY, DOUBLE-STRANDED: ds DNA Ab: 1 IU/mL

## 2018-01-15 LAB — C3 AND C4
C3 COMPLEMENT: 122 mg/dL (ref 83–193)
C4 COMPLEMENT: 26 mg/dL (ref 15–57)

## 2018-01-15 NOTE — Progress Notes (Signed)
UA revealed findings consistent with a possible UTI.  Please notify patient and advise patient to follow up with PCP.  CMP and CBC WNL.  Complements WNL. Sed rate is borderline elevated.

## 2018-01-16 NOTE — Progress Notes (Signed)
DsDNA is <1.

## 2018-02-22 NOTE — Progress Notes (Deleted)
Office Visit Note  Patient: [name]             Date of Birth: Dec 31, 1975           MRN: [id number]             PCP: Devra Dopp, MD Referring: Devra Dopp, MD Visit Date: 03/08/2018 Occupation: @GUAROCC @  Subjective:  No chief complaint on file.  Plaquenil 200 mg twice daily Monday through Friday only.  Last Plaquenil eye exam normal on 09/12/2017 and will monitor yearly.  Most recent CBC/CMP within normal limits on 01/12/2018 and will monitor every 5 months.  History of Present Illness: [name] is a 43 y.o. female ***   Activities of Daily Living:  Patient reports morning stiffness for 5-10 minutes.   Patient Reports nocturnal pain.  Difficulty dressing/grooming: Denies Difficulty climbing stairs: Reports Difficulty getting out of chair: Denies Difficulty using hands for taps, buttons, cutlery, and/or writing: Denies  Review of Systems  Constitutional: Positive for fatigue.  HENT: Positive for mouth dryness. Negative for mouth sores and nose dryness.   Eyes: Negative for itching and dryness.  Respiratory: Negative for shortness of breath, wheezing and difficulty breathing.   Cardiovascular: Negative for chest pain, palpitations and swelling in legs/feet.  Gastrointestinal: Negative for abdominal pain, blood in stool, constipation and diarrhea.  Endocrine: Negative for increased urination.  Genitourinary: Negative for painful urination and pelvic pain.  Musculoskeletal: Positive for arthralgias, joint pain, joint swelling and morning stiffness.  Skin: Positive for rash and redness.  Allergic/Immunologic: Negative for susceptible to infections.  Neurological: Positive for headaches and weakness. Negative for dizziness, light-headedness and memory loss.  Hematological: Positive for bruising/bleeding tendency.  Psychiatric/Behavioral: Positive for sleep disturbance. Negative for confusion. The patient is not nervous/anxious.     PMFS History:  Patient  Active Problem List   Diagnosis Date Noted  . Primary insomnia 04/04/2017  . 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 malar rash, history of oral ulcers and sicca symptoms 07/22/2016  . High risk medication use 07/22/2016  . History of hypothyroidism 07/22/2016  . History of gastroesophageal reflux (GERD) 07/22/2016  . Osteopenia of multiple sites 07/22/2016  . History of depression 07/22/2016    Past Medical History:  Diagnosis Date  . Abnormal Pap smear 1996  . Asthma   . Dyspareunia 2002  . Endometriosis 2004  . H/O amenorrhea 2009  . H/O candidiasis   . H/O cervicitis 2002  . H/O dysmenorrhea 2004  . H/O tinea cruris 2002  . H/O varicella   . H/O vulvovaginitis 2002  . H/O: menorrhagia 2003  . Hx: UTI (urinary tract infection)   . Hypothyroidism   . Libido, decreased 2002  . Menses, irregular 2004  . Ovarian cyst, left 2008  . PCB (post coital bleeding) 2003  . Pelvic pain in female 2007    Family History  Problem Relation Age of Onset  . Asthma Mother   . Diabetes Mother   . Depression Mother   . Asthma Father   . Heart disease Brother        Heart Murmur  . Arthritis Maternal Grandmother   . Cancer Maternal Grandmother        Breast & liver  . Depression Maternal Grandmother   . Heart disease Maternal Grandfather        MI  . Asthma Maternal Grandfather   . Diabetes Maternal Grandfather   . Kidney disease Maternal Grandfather  Dialysis   Past Surgical History:  Procedure Laterality Date  . BREAST SURGERY    . GASTRIC    . NOVASURE ABLATION    . TUBAL LIGATION    . WISDOM TOOTH EXTRACTION  [id number]   Social History   Social History Narrative  . Not on file    There is no immunization history on file for this patient.   Objective: Vital Signs: There were no vitals taken for this visit.   Physical Exam   Musculoskeletal Exam: ***  CDAI Exam: CDAI Score: Not documented Patient Global Assessment:  Not documented; Provider Global Assessment: Not documented Swollen: Not documented; Tender: Not documented Joint Exam   Not documented   There is currently no information documented on the homunculus. Go to the Rheumatology activity and complete the homunculus joint exam.  Investigation: No additional findings.  Imaging: No results found.  Recent Labs: Lab Results  Component Value Date   WBC 4.1 01/12/2018   HGB 11.7 01/12/2018   PLT 282 01/12/2018   NA 141 01/12/2018   K 3.9 01/12/2018   CL 104 01/12/2018   CO2 29 01/12/2018   GLUCOSE 86 01/12/2018   BUN 24 01/12/2018   CREATININE 0.72 01/12/2018   BILITOT 0.3 01/12/2018   ALKPHOS 50 07/07/2017   AST 13 01/12/2018   ALT 7 01/12/2018   PROT 7.2 01/12/2018   ALBUMIN 3.9 07/07/2017   CALCIUM 9.2 01/12/2018   GFRAA 118 01/12/2018    Speciality Comments: PLQ eye exam: 09/12/17 Normal. [facility] Follow up in 12 months.  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 malar rash, history of oral ulcers and sicca symptoms  High risk medication use - Plaquenil 200 mg 1 tablet twice daily Monday through Friday.  Primary osteoarthritis of both knees  Plantar fasciitis of right foot  Osteopenia of multiple sites  History of depression  History of gastroesophageal reflux (GERD)  History of hypothyroidism  Primary insomnia   Orders: No orders of the defined types were placed in this encounter.  No orders of the defined types were placed in this encounter.   Face-to-face time spent with patient was *** minutes. Greater than 50% of time was spent in counseling and coordination of care.  Follow-Up Instructions: No follow-ups on file.   Gearldine Bienenstock, PA-C  Note - This record has been created using Dragon software.  Chart creation errors have been sought, but  may not always  have been located. Such creation errors do not reflect on  the standard of medical care.

## 2018-03-08 ENCOUNTER — Encounter: Payer: Self-pay | Admitting: Physician Assistant

## 2018-03-08 ENCOUNTER — Ambulatory Visit: Payer: 59 | Admitting: Physician Assistant

## 2018-03-08 VITALS — BP 110/78 | HR 77 | Resp 14 | Ht 61.0 in | Wt 187.0 lb

## 2018-03-08 DIAGNOSIS — Z8639 Personal history of other endocrine, nutritional and metabolic disease: Secondary | ICD-10-CM

## 2018-03-08 DIAGNOSIS — Z8719 Personal history of other diseases of the digestive system: Secondary | ICD-10-CM

## 2018-03-08 DIAGNOSIS — M8589 Other specified disorders of bone density and structure, multiple sites: Secondary | ICD-10-CM

## 2018-03-08 DIAGNOSIS — M170 Bilateral primary osteoarthritis of knee: Secondary | ICD-10-CM | POA: Diagnosis not present

## 2018-03-08 DIAGNOSIS — Z79899 Other long term (current) drug therapy: Secondary | ICD-10-CM

## 2018-03-08 DIAGNOSIS — M359 Systemic involvement of connective tissue, unspecified: Secondary | ICD-10-CM | POA: Diagnosis not present

## 2018-03-08 DIAGNOSIS — M722 Plantar fascial fibromatosis: Secondary | ICD-10-CM

## 2018-03-08 DIAGNOSIS — Z8659 Personal history of other mental and behavioral disorders: Secondary | ICD-10-CM

## 2018-03-08 DIAGNOSIS — F5101 Primary insomnia: Secondary | ICD-10-CM

## 2018-03-08 MED ORDER — HYDROXYCHLOROQUINE SULFATE 200 MG PO TABS: ORAL_TABLET | ORAL | 0 refills | Status: DC

## 2018-03-08 NOTE — Progress Notes (Signed)
Office Visit Note  Patient: [name]  Date of Birth: [birth year]  MRN: [id number]  PCP: Devra Dopp, MD  Referring: Devra Dopp, MD  Visit Date: 03/08/2018  Occupation: @    Subjective:  Facial rash  History of Present Illness: [name] is a 43 y.o. female with history of autoimmune disease and osteoarthritis. She is on PLQ 200 mg BID M-F. She has not missed any doses. She reports that she has been having more frequent rashes. She states she currently has a rash on her face as well as her chest. She is also noticed increased bruising. She denies any photosensitivity. She does wear make-up with SPF but does not wear sunscreen on a daily basis. She has been applying hydrocortisone cream to the rashes which helps. She denies any symptoms of Raynaud's. She denies any hair loss. She continues to have mouth dryness but no eye dryness. She does not use over-the-counter products. She denies any sores in her mouth or nose. She reports she has been having some discomfort in bilateral hands and occasionally in bilateral knee joints. She denies any sores in mouth or nose. She denies any palpitations or shortness of breath. She has chronic fatigue but does not sleep well at night. She takes Ambien 10 mg PRN at bedtime. She denies any fevers or swollen lymph nodes.   Activities of Daily Living:  Patient reports morning stiffness for 5-10 minutes.  Patient Reports nocturnal pain.  Difficulty dressing/grooming: Denies  Difficulty climbing stairs: Reports  Difficulty getting out of chair: Denies  Difficulty using hands for taps, buttons, cutlery, and/or writing: Denies   Review of Systems  Constitutional: Positive for fatigue.  HENT: Positive for mouth dryness. Negative for mouth sores and nose dryness.  Eyes: Negative for pain, itching, visual disturbance and dryness.  Respiratory: Negative for cough, hemoptysis, shortness of breath, wheezing and difficulty breathing.    Cardiovascular: Negative for chest pain, palpitations, hypertension and swelling in legs/feet.  Gastrointestinal: Negative for abdominal pain, blood in stool, constipation and diarrhea.  Endocrine: Negative for increased urination.  Genitourinary: Negative for painful urination and pelvic pain.  Musculoskeletal: Positive for arthralgias, joint pain, joint swelling and morning stiffness. Negative for myalgias, muscle weakness, muscle tenderness and myalgias.  Skin: Positive for rash and redness. Negative for color change, pallor, hair loss, nodules/bumps, skin tightness, ulcers and sensitivity to sunlight.  Allergic/Immunologic: Negative for susceptible to infections.  Neurological: Positive for headaches. Negative for dizziness, light-headedness, numbness and memory loss.  Hematological: Negative for swollen glands.  Psychiatric/Behavioral: Positive for sleep disturbance. Negative for depressed mood and confusion. The patient is not nervous/anxious.   PMFS History:      Patient Active Problem List   Diagnosis Date Noted  . Primary insomnia 04/04/2017  . 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 malar rash, history of oral ulcers and sicca symptoms 07/22/2016  . High risk medication use 07/22/2016  . History of hypothyroidism 07/22/2016  . History of gastroesophageal reflux (GERD) 07/22/2016  . Osteopenia of multiple sites 07/22/2016  . History of depression 07/22/2016       Past Medical History:  Diagnosis Date  . Abnormal Pap smear 1996  . Asthma   . Dyspareunia 2002  . Endometriosis 2004  . H/O amenorrhea 2009  . H/O candidiasis   . H/O cervicitis 2002  . H/O dysmenorrhea 2004  . H/O tinea cruris 2002  . H/O varicella   . H/O vulvovaginitis 2002  . H/O: menorrhagia  2003  . Hx: UTI (urinary tract infection)   . Hypothyroidism   . Libido, decreased 2002  . Menses, irregular 2004  . Ovarian cyst, left 2008  . PCB (post coital bleeding)  2003  . Pelvic pain in female 2007        Family History  Problem Relation Age of Onset  . Asthma Mother   . Diabetes Mother   . Depression Mother   . Asthma Father   . Heart disease Brother    Heart Murmur  . Arthritis Maternal Grandmother   . Cancer Maternal Grandmother    Breast & liver  . Depression Maternal Grandmother   . Heart disease Maternal Grandfather    MI  . Asthma Maternal Grandfather   . Diabetes Maternal Grandfather   . Kidney disease Maternal Grandfather    Dialysis        Past Surgical History:  Procedure Laterality Date  . BREAST SURGERY    . GASTRIC    . NOVASURE ABLATION    . TUBAL LIGATION    . WISDOM TOOTH EXTRACTION  [id number]   Social History      Social History Narrative  . Not on file    There is no immunization history on file for this patient.  Objective:  Vital Signs: BP 110/78 (BP Location: Left Arm, Patient Position: Sitting, Cuff Size: Large)  Pulse 77  Resp 14  Ht 5\' 1"  (1.549 m)  Wt 187 lb (84.8 kg)  BMI 35.33 kg/m  Physical Exam  Vitals signs and nursing note reviewed.  Constitutional:  Appearance: She is well-developed.  HENT:  Head: Normocephalic and atraumatic.  Comments: No parotid swelling or tenderness.  Nose:  Comments: No nasal ulcerations  Mouth/Throat:  Comments: No oral ulcerations  Eyes:  Conjunctiva/sclera: Conjunctivae normal.  Neck:  Musculoskeletal: Normal range of motion.  Cardiovascular:  Rate and Rhythm: Normal rate and regular rhythm.  Heart sounds: Normal heart sounds.  Pulmonary:  Effort: Pulmonary effort is normal.  Breath sounds: Normal breath sounds.  Abdominal:  General: Bowel sounds are normal.  Palpations: Abdomen is soft.  Lymphadenopathy:  Cervical: No cervical adenopathy.  Skin:  General: Skin is warm and dry.  Capillary Refill: Capillary refill takes less than 2 seconds.  Comments: Malar rash noted. No digital ulcerations or signs of gangrene.  Neurological:  Mental  Status: She is alert and oriented to person, place, and time.  Psychiatric:  Behavior: Behavior normal.  Musculoskeletal Exam: C-spine, thoracic spine, and lumbar spine good ROM. No midline spinal tenderness. No SI joint tenderness. Shoulder joints, elbow joints, wrist joints, MCPs, PIPs, and DIPs good ROM with no synovitis. Complete fist formation bilaterally. Hip joints, knee joints, ankle joints, MTPs, PIPs, and DIPs good ROM with no synovitis. No warmth or effusion of knee joints. No tenderness or swelling of ankle joints. No tenderness over trochanteric bursa bilaterally.  CDAI Exam:  CDAI Score: Not documented  Patient Global Assessment: Not documented; Provider Global Assessment: Not documented  Swollen: Not documented; Tender: Not documented  Joint Exam     There is currently no information documented on the homunculus. Go to the Rheumatology activity and complete the homunculus joint exam.  Investigation:  No additional findings.  Imaging:  Imaging Results     Recent Labs:  Recent Labs  Speciality Comments: PLQ eye exam: 09/12/17 Normal. [facility] Follow up in 12 months.  Procedures:  No procedures performed  Allergies: Sulfa drugs cross reactors  Assessment / Plan:  Visit Diagnoses:  Autoimmune disease (HCC)positive ANA, positive Ro and positive La antibody. malar rash, history of oral ulcers and sicca symptoms: She presents today with a malar rash. She has been having intermittent rashes on her face and chest, and she has been applying hydrocortisone cream. We discussed the importance of wearing sunscreen daily. She takes Plaquenil 200 mg 1 tablet BID M-F, and she has not missed any doses recently. She has no synovitis on exam. She has occasional bilateral hand pain and bilateral knee joint pain. No warmth or effusion of knee  joints noted.   High risk medication use - Plaquenil 200 mg 1 tablet twice daily Monday through Friday. Last Plaquenil eye exam normal on 09/12/2017 and will monitor yearly. Most recent CBC/CMP within normal limits on 01/12/2018 and will monitor every 5 months.   Primary osteoarthritis of both knees: No warmth or effusion of knee joints. Good ROM with no discomfort. She has occasional knee discomfort.   Plantar fasciitis of right foot: Resolved.   Osteopenia of multiple sites: She is taking a calcium and vitamin D supplement.   Primary insomnia: She takes Ambien 10 mg PRN at bedtime for insomnia.   Other medical conditions are listed as follows:   History of depression   History of gastroesophageal reflux (GERD)   History of hypothyroidism   Orders:  No orders of the defined types were placed in this encounter.   No orders of the defined types were placed in this encounter.    Follow-Up Instructions: Return in about 6 months (around 09/08/2018) for Autoimmune Disease, Osteoarthritis.  Gearldine Bienenstock, PA-C  Note - This record has been created using Dragon software.  Chart creation errors have been sought, but may not always  have been located. Such creation errors do not reflect on  the standard of medical care.

## 2018-03-09 NOTE — Progress Notes (Signed)
UA reveals findings consistent with possible UTI.  Please notify patient and forward labs to PCP for further evaluation.   Sed rate WNL Complements WNL CMP WNL

## 2018-03-12 LAB — COMPLETE METABOLIC PANEL WITH GFR
AG Ratio: 1.7 (calc) (ref 1.0–2.5)
ALT: 8 U/L (ref 6–29)
AST: 12 U/L (ref 10–30)
Albumin: 4.6 g/dL (ref 3.6–5.1)
Alkaline phosphatase (APISO): 65 U/L (ref 31–125)
BUN: 18 mg/dL (ref 7–25)
CO2: 26 mmol/L (ref 20–32)
Calcium: 9.3 mg/dL (ref 8.6–10.2)
Chloride: 105 mmol/L (ref 98–110)
Creat: 0.73 mg/dL (ref 0.50–1.10)
GFR, Est African American: 116 mL/min/{1.73_m2} (ref >=60)
GFR, Est Non African American: 100 mL/min/{1.73_m2} (ref >=60)
GLOBULIN: 2.7 g/dL (ref 1.9–3.7)
Glucose, Bld: 94 mg/dL (ref 65–99)
POTASSIUM: 4.1 mmol/L (ref 3.5–5.3)
SODIUM: 140 mmol/L (ref 135–146)
Total Bilirubin: 0.2 mg/dL (ref 0.2–1.2)
Total Protein: 7.3 g/dL (ref 6.1–8.1)

## 2018-03-12 LAB — CBC WITH DIFFERENTIAL/PLATELET
Absolute Monocytes: 270 cells/uL (ref 200–950)
Basophils Absolute: 30 cells/uL (ref 0–200)
Basophils Relative: 0.8 %
EOS PCT: 5.1 %
Eosinophils Absolute: 189 cells/uL (ref 15–500)
HCT: 37.1 % (ref 35.0–45.0)
Hemoglobin: 12.2 g/dL (ref 11.7–15.5)
Lymphs Abs: 944 cells/uL (ref 850–3900)
MCH: 27.3 pg (ref 27.0–33.0)
MCHC: 32.9 g/dL (ref 32.0–36.0)
MCV: 83 fL (ref 80.0–100.0)
MPV: 10.3 fL (ref 7.5–12.5)
Monocytes Relative: 7.3 %
Neutro Abs: 2268 cells/uL (ref 1500–7800)
Neutrophils Relative %: 61.3 %
PLATELETS: 277 10*3/uL (ref 140–400)
RBC: 4.47 10*6/uL (ref 3.80–5.10)
RDW: 12.8 % (ref 11.0–15.0)
Total Lymphocyte: 25.5 %
WBC: 3.7 10*3/uL — ABNORMAL LOW (ref 3.8–10.8)

## 2018-03-12 LAB — URINALYSIS, ROUTINE W REFLEX MICROSCOPIC
Bacteria, UA: NONE SEEN /HPF
Bilirubin Urine: NEGATIVE
Glucose, UA: NEGATIVE
HGB URINE DIPSTICK: NEGATIVE
Hyaline Cast: NONE SEEN /LPF
Ketones, ur: NEGATIVE
Nitrite: NEGATIVE
Protein, ur: NEGATIVE
Specific Gravity, Urine: 1.025 (ref 1.001–1.03)
pH: 5.5 (ref 5.0–8.0)

## 2018-03-12 LAB — C3 AND C4
C3 Complement: 124 mg/dL (ref 83–193)
C4 COMPLEMENT: 27 mg/dL (ref 15–57)

## 2018-03-12 LAB — SJOGRENS SYNDROME-A EXTRACTABLE NUCLEAR ANTIBODY: SSA (Ro) (ENA) Antibody, IgG: 2.3 AI — AB

## 2018-03-12 LAB — SEDIMENTATION RATE: SED RATE: 19 mm/h (ref 0–20)

## 2018-03-12 LAB — ANTI-DNA ANTIBODY, DOUBLE-STRANDED: DS DNA AB: 1 [IU]/mL

## 2018-03-13 NOTE — Progress Notes (Signed)
WBC count is borderline low.  We will continue to monitor. Ro antibody remains positive. DsDNA is 1.

## 2018-05-09 ENCOUNTER — Other Ambulatory Visit: Payer: Self-pay | Admitting: Physician Assistant

## 2018-05-09 DIAGNOSIS — M359 Systemic involvement of connective tissue, unspecified: Secondary | ICD-10-CM

## 2018-05-09 NOTE — Telephone Encounter (Signed)
Last Visit: 03/08/2018 Next Visit: 08/14/2018 Labs: 03/08/2018 WBC 3.7 Eye exam: 09/12/2017  Okay to refill per Dr. Deveshwar.  

## 2018-07-25 ENCOUNTER — Other Ambulatory Visit: Payer: Self-pay | Admitting: Rheumatology

## 2018-07-25 DIAGNOSIS — M359 Systemic involvement of connective tissue, unspecified: Secondary | ICD-10-CM

## 2018-07-25 NOTE — Telephone Encounter (Signed)
Last Visit: 03/08/2018 Next Visit: 08/14/2018 Labs: 03/08/2018 WBC 3.7 Eye exam: 09/12/2017  Okay to refill per Dr. Estanislado Pandy.

## 2018-07-31 NOTE — Progress Notes (Deleted)
Office Visit Note  Patient: [name]             Date of Birth: Jan 29, 1975           MRN: [id number]             PCP: Helane Rima, MD Referring: Helane Rima, MD Visit Date: 08/14/2018 Occupation: @GUAROCC @  Subjective:  No chief complaint on file.   History of Present Illness: [name] is a 43 y.o. female ***   Activities of Daily Living:  Patient reports morning stiffness for *** {minute/hour:19697}.   Patient {ACTIONS;DENIES/REPORTS:21021675::"Denies"} nocturnal pain.  Difficulty dressing/grooming: {ACTIONS;DENIES/REPORTS:21021675::"Denies"} Difficulty climbing stairs: {ACTIONS;DENIES/REPORTS:21021675::"Denies"} Difficulty getting out of chair: {ACTIONS;DENIES/REPORTS:21021675::"Denies"} Difficulty using hands for taps, buttons, cutlery, and/or writing: {ACTIONS;DENIES/REPORTS:21021675::"Denies"}  No Rheumatology ROS completed.   PMFS History:  Patient Active Problem List   Diagnosis Date Noted  . Primary insomnia 04/04/2017  . 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 malar rash, history of oral ulcers and sicca symptoms 07/22/2016  . High risk medication use 07/22/2016  . History of hypothyroidism 07/22/2016  . History of gastroesophageal reflux (GERD) 07/22/2016  . Osteopenia of multiple sites 07/22/2016  . History of depression 07/22/2016    Past Medical History:  Diagnosis Date  . Abnormal Pap smear 1996  . Asthma   . Dyspareunia 2002  . Endometriosis 2004  . H/O amenorrhea 2009  . H/O candidiasis   . H/O cervicitis 2002  . H/O dysmenorrhea 2004  . H/O tinea cruris 2002  . H/O varicella   . H/O vulvovaginitis 2002  . H/O: menorrhagia 2003  . Hx: UTI (urinary tract infection)   . Hypothyroidism   . Libido, decreased 2002  . Menses, irregular 2004  . Ovarian cyst, left 2008  . PCB (post coital bleeding) 2003  . Pelvic pain in female 2007    Family History  Problem Relation Age of Onset  .  Asthma Mother   . Diabetes Mother   . Depression Mother   . Asthma Father   . Heart disease Brother        Heart Murmur  . Arthritis Maternal Grandmother   . Cancer Maternal Grandmother        Breast & liver  . Depression Maternal Grandmother   . Heart disease Maternal Grandfather        MI  . Asthma Maternal Grandfather   . Diabetes Maternal Grandfather   . Kidney disease Maternal Grandfather        Dialysis   Past Surgical History:  Procedure Laterality Date  . BREAST SURGERY    . GASTRIC    . NOVASURE ABLATION    . TUBAL LIGATION    . WISDOM TOOTH EXTRACTION  [id number]   Social History   Social History Narrative  . Not on file    There is no immunization history on file for this patient.   Objective: Vital Signs: There were no vitals taken for this visit.   Physical Exam   Musculoskeletal Exam: ***  CDAI Exam: CDAI Score: - Patient Global: -; Provider Global: - Swollen: -; Tender: - Joint Exam   No joint exam has been documented for this visit   There is currently no information documented on the homunculus. Go to the Rheumatology activity and complete the homunculus joint exam.  Investigation: No additional findings.  Imaging: No results found.  Recent Labs: Lab Results  Component Value Date   WBC 3.7 (L) 03/08/2018   HGB 12.2  03/08/2018   PLT 277 03/08/2018   NA 140 03/08/2018   K 4.1 03/08/2018   CL 105 03/08/2018   CO2 26 03/08/2018   GLUCOSE 94 03/08/2018   BUN 18 03/08/2018   CREATININE 0.73 03/08/2018   BILITOT 0.2 03/08/2018   ALKPHOS 50 07/07/2017   AST 12 03/08/2018   ALT 8 03/08/2018   PROT 7.3 03/08/2018   ALBUMIN 3.9 07/07/2017   CALCIUM 9.3 03/08/2018   GFRAA 116 03/08/2018    Speciality Comments: PLQ eye exam: 09/12/17 Normal. [facility] Follow up in 12 months.  Procedures:  No procedures performed Allergies: Sulfa drugs cross reactors   Assessment / Plan:     Visit Diagnoses: No diagnosis found.  Orders:  No orders of the defined types were placed in this encounter.  No orders of the defined types were placed in this encounter.   Face-to-face time spent with patient was *** minutes. Greater than 50% of time was spent in counseling and coordination of care.  Follow-Up Instructions: No follow-ups on file.   Gearldine Bienenstockaylor M Dale, PA-C  Note - This record has been created using Dragon software.  Chart creation errors have been sought, but may not always  have been located. Such creation errors do not reflect on  the standard of medical care.

## 2018-08-14 ENCOUNTER — Ambulatory Visit: Payer: Self-pay | Admitting: Physician Assistant

## 2018-08-16 NOTE — Progress Notes (Signed)
Office Visit Note  Patient: [name]             Date of Birth: Feb 13, 1975           MRN: [id number]             PCP: Devra DoppHowell, Tamieka, MD Referring: Devra DoppHowell, Tamieka, MD Visit Date: 08/30/2018 Occupation: @GUAROCC @  Subjective:  morning stiffness   History of Present Illness: [name] is a 43 y.o. female with history of autoimmune disease and osteoarthritis.  She is taking Plaquenil 200 mg 1 tablet by mouth twice daily Monday through Friday.  She has been tolerating PLQ.  She denies any recent flares.  She denies any joint pain or joint swelling.  She has morning stiffness for about 5 minutes.  She has chronic mouth dryness but no eye dryness. She denies any sores in mouth or nose.  She denies any symptoms of raynaud's or recent rashes.  She has mild photosensitivity.    Activities of Daily Living:  Patient reports morning stiffness for 5 minutes.   Patient Denies nocturnal pain.  Difficulty dressing/grooming: Denies Difficulty climbing stairs: Denies Difficulty getting out of chair: Denies Difficulty using hands for taps, buttons, cutlery, and/or writing: Denies  Review of Systems  Constitutional: Positive for fatigue.  HENT: Positive for mouth dryness. Negative for mouth sores, trouble swallowing, trouble swallowing and nose dryness.   Eyes: Negative for pain, itching, visual disturbance and dryness.  Respiratory: Negative for cough, hemoptysis, shortness of breath and difficulty breathing.   Cardiovascular: Negative for chest pain, palpitations, hypertension and swelling in legs/feet.  Gastrointestinal: Negative for blood in stool, constipation and diarrhea.  Endocrine: Negative for increased urination.  Genitourinary: Negative for difficulty urinating and painful urination.  Musculoskeletal: Positive for morning stiffness. Negative for arthralgias, joint pain, joint swelling, myalgias, muscle weakness, muscle tenderness and myalgias.  Skin: Negative for color  change, pallor, rash, hair loss, nodules/bumps, redness, skin tightness, ulcers and sensitivity to sunlight.  Allergic/Immunologic: Negative for susceptible to infections.  Neurological: Negative for dizziness, light-headedness, numbness, headaches, memory loss and weakness.  Hematological: Negative for swollen glands.  Psychiatric/Behavioral: Positive for sleep disturbance. Negative for depressed mood and confusion. The patient is not nervous/anxious.     PMFS History:  Patient Active Problem List   Diagnosis Date Noted   Primary insomnia 04/04/2017   Autoimmune disease (HCC)positive ANA, positive Ro and positive La antibody. malar rash, history of oral ulcers and sicca symptoms 07/22/2016   High risk medication use 07/22/2016   History of hypothyroidism 07/22/2016   History of gastroesophageal reflux (GERD) 07/22/2016   Osteopenia of multiple sites 07/22/2016   History of depression 07/22/2016    Past Medical History:  Diagnosis Date   Abnormal Pap smear 1996   Asthma    Dyspareunia 2002   Endometriosis 2004   H/O amenorrhea 2009   H/O candidiasis    H/O cervicitis 2002   H/O dysmenorrhea 2004   H/O tinea cruris 2002   H/O varicella    H/O vulvovaginitis 2002   H/O: menorrhagia 2003   Hx: UTI (urinary tract infection)    Hypothyroidism    Libido, decreased 2002   Menses, irregular 2004   Ovarian cyst, left 2008   PCB (post coital bleeding) 2003   Pelvic pain in female 2007    Family History  Problem Relation Age of Onset   Asthma Mother    Diabetes Mother    Depression Mother    Asthma Father  Heart disease Brother        Heart Murmur   Arthritis Maternal Grandmother    Cancer Maternal Grandmother        Breast & liver   Depression Maternal Grandmother    Heart disease Maternal Grandfather        MI   Asthma Maternal Grandfather    Diabetes Maternal Grandfather    Kidney disease Maternal Grandfather        Dialysis    Past Surgical History:  Procedure Laterality Date   BREAST SURGERY     GASTRIC     NOVASURE ABLATION     TUBAL LIGATION     WISDOM TOOTH EXTRACTION  [id number]   Social History   Social History Narrative   Not on file    There is no immunization history on file for this patient.   Objective: Vital Signs: BP 113/82 (BP Location: Left Arm, Patient Position: Sitting, Cuff Size: Normal)    Pulse 74    Resp 13    Ht 5\' 1"  (1.549 m)    Wt 192 lb 12.8 oz (87.5 kg)    BMI 36.43 kg/m    Physical Exam Vitals signs and nursing note reviewed.  Constitutional:      Appearance: She is well-developed.  HENT:     Head: Normocephalic and atraumatic.  Eyes:     Conjunctiva/sclera: Conjunctivae normal.  Neck:     Musculoskeletal: Normal range of motion.  Cardiovascular:     Rate and Rhythm: Normal rate and regular rhythm.     Heart sounds: Normal heart sounds.  Pulmonary:     Effort: Pulmonary effort is normal.     Breath sounds: Normal breath sounds.  Abdominal:     General: Bowel sounds are normal.     Palpations: Abdomen is soft.  Lymphadenopathy:     Cervical: No cervical adenopathy.  Skin:    General: Skin is warm and dry.     Capillary Refill: Capillary refill takes less than 2 seconds.  Neurological:     Mental Status: She is alert and oriented to person, place, and time.  Psychiatric:        Behavior: Behavior normal.      Musculoskeletal Exam: C-spine, thoracic spine, and lumbar spine good ROM.  No midline spinal tenderness.  No SI joint tenderness.  Shoulder joints, elbow joints, wrist joints, MCPs, PIPs, and DIPs good ROM with no synovitis.  Complete fist formation bilaterally.  Hip joints, knee joints, ankle joints, MTPs, PIPs, and DIPs good ROM with no synovitis.  No warmth or effusion of knee joints.  No tenderness or swelling of ankle joints.    CDAI Exam: CDAI Score: -- Patient Global: --; Provider Global: -- Swollen: --; Tender: -- Joint Exam   No  joint exam has been documented for this visit   There is currently no information documented on the homunculus. Go to the Rheumatology activity and complete the homunculus joint exam.  Investigation: No additional findings.  Imaging: No results found.  Recent Labs: Lab Results  Component Value Date   WBC 3.7 (L) 03/08/2018   HGB 12.2 03/08/2018   PLT 277 03/08/2018   NA 140 03/08/2018   K 4.1 03/08/2018   CL 105 03/08/2018   CO2 26 03/08/2018   GLUCOSE 94 03/08/2018   BUN 18 03/08/2018   CREATININE 0.73 03/08/2018   BILITOT 0.2 03/08/2018   ALKPHOS 50 07/07/2017   AST 12 03/08/2018   ALT 8 03/08/2018   PROT  7.3 03/08/2018   ALBUMIN 3.9 07/07/2017   CALCIUM 9.3 03/08/2018   GFRAA 116 03/08/2018    Speciality Comments: PLQ eye exam: 09/12/17 Normal. [place] Follow up in 12 months.  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 malar rash, history of oral ulcers and sicca symptoms -She has not had any signs or symptoms of a flare recently.  She is clinically doing well on Plaquenil 200 mg 1 tablet by mouth twice daily Monday through Friday.  She has been tolerating Plaquenil was found to be effective.  She has no joint pain or joint synovitis at this time.  She has morning stiffness for about 5 minutes.  She has not had any recent rashes.  No Maller rash was noted.  She does have some photosensitivity and was encouraged to wear sunscreen on a daily basis.  She has not had any oral nasal ulcerations recently.  She continues to have mouth dryness but no eye dryness.  She has not had any palpitations or shortness of breath.  She has not had any symptoms of Raynolds recently.  She will continue taking Plaquenil as prescribed.  She does not need any refills at this time.  We will check autoimmune lab work today.  She was advised to  notify us if she develops any new or worsening symptoms.  She will follow-up in the office in 5 months.  Plan: CBC with Differential/Platelet, COMPLETE METABOLIC PANEL WITH GFR, Urinalysis, Routine w reflex microscopic, Anti-DNA antibody, double-stranded, C3 and C4, Sedimentation rate  High risk medication use - Plaquenil 200 mg 1 tablet twice daily Monday through Friday.  eye exam: 09/12/17.  She will schedule an updated PLQ eye exam.  She was given a Plaquenil eye exam form today in the office CBC and CMP will be drawn today. - Plan: CBC with Differential/Platelet, COMPLETE METABOLIC PANEL WITH GFR  Primary osteoarthritis of both knees: She has good range of motion with no discomfort.  No warmth or effusion was noted.  She has no difficulty with ADLs.  Plantar fasciitis of right foot: Resolved.  Osteopenia of multiple sites: She is taking a calcium and vitamin D supplement.  Primary insomnia -She takes Ambien 10 mg PRN at bedtime for insomnia.   Other medical conditions are listed as follows:  History of hypothyroidism  History of depression  History of gastroesophageal reflux (GERD)  Orders: Orders Placed This Encounter  Procedures   CBC with Differential/Platelet   COMPLETE METABOLIC PANEL WITH GFR   Urinalysis, Routine w reflex microscopic   Anti-DNA antibody, double-stranded   C3 and C4   Sedimentation rate   No orders of the defined types were placed in this encounter.     Follow-Up Instructions: Return in about 5 months (around 01/30/2019) for Autoimmune Disease, Osteoarthritis.   Ofilia Neas, PA-C  Note - This record has been created using Dragon software.  Chart creation errors have been sought, but may not always  have been located. Such creation errors do not reflect on  the standard of medical care.

## 2018-08-30 ENCOUNTER — Other Ambulatory Visit: Payer: Self-pay

## 2018-08-30 ENCOUNTER — Ambulatory Visit: Payer: 59 | Admitting: Physician Assistant

## 2018-08-30 ENCOUNTER — Encounter: Payer: Self-pay | Admitting: Physician Assistant

## 2018-08-30 VITALS — BP 113/82 | HR 74 | Resp 13 | Ht 61.0 in | Wt 192.8 lb

## 2018-08-30 DIAGNOSIS — M722 Plantar fascial fibromatosis: Secondary | ICD-10-CM

## 2018-08-30 DIAGNOSIS — M170 Bilateral primary osteoarthritis of knee: Secondary | ICD-10-CM | POA: Diagnosis not present

## 2018-08-30 DIAGNOSIS — Z8659 Personal history of other mental and behavioral disorders: Secondary | ICD-10-CM

## 2018-08-30 DIAGNOSIS — Z8719 Personal history of other diseases of the digestive system: Secondary | ICD-10-CM

## 2018-08-30 DIAGNOSIS — Z79899 Other long term (current) drug therapy: Secondary | ICD-10-CM | POA: Diagnosis not present

## 2018-08-30 DIAGNOSIS — M359 Systemic involvement of connective tissue, unspecified: Secondary | ICD-10-CM | POA: Diagnosis not present

## 2018-08-30 DIAGNOSIS — M8589 Other specified disorders of bone density and structure, multiple sites: Secondary | ICD-10-CM

## 2018-08-30 DIAGNOSIS — F5101 Primary insomnia: Secondary | ICD-10-CM

## 2018-08-30 DIAGNOSIS — Z8639 Personal history of other endocrine, nutritional and metabolic disease: Secondary | ICD-10-CM

## 2018-08-31 LAB — COMPLETE METABOLIC PANEL WITH GFR
AG Ratio: 1.5 (calc) (ref 1.0–2.5)
ALT: 6 U/L (ref 6–29)
AST: 12 U/L (ref 10–35)
Albumin: 4.1 g/dL (ref 3.6–5.1)
Alkaline phosphatase (APISO): 60 U/L (ref 31–125)
BUN: 20 mg/dL (ref 7–25)
CO2: 29 mmol/L (ref 20–32)
Calcium: 9 mg/dL (ref 8.6–10.2)
Chloride: 105 mmol/L (ref 98–110)
Creat: 0.73 mg/dL (ref 0.50–1.10)
GFR, Est African American: 115 mL/min/{1.73_m2} (ref >=60)
GFR, Est Non African American: 99 mL/min/{1.73_m2} (ref >=60)
Globulin: 2.7 g/dL (calc) (ref 1.9–3.7)
Glucose, Bld: 87 mg/dL (ref 65–99)
Potassium: 4 mmol/L (ref 3.5–5.3)
Sodium: 140 mmol/L (ref 135–146)
Total Bilirubin: 0.2 mg/dL (ref 0.2–1.2)
Total Protein: 6.8 g/dL (ref 6.1–8.1)

## 2018-08-31 LAB — SEDIMENTATION RATE: Sed Rate: 17 mm/h (ref 0–20)

## 2018-08-31 LAB — URINALYSIS, ROUTINE W REFLEX MICROSCOPIC
Bilirubin Urine: NEGATIVE
Glucose, UA: NEGATIVE
Hgb urine dipstick: NEGATIVE
Ketones, ur: NEGATIVE
Leukocytes,Ua: NEGATIVE
Nitrite: NEGATIVE
Protein, ur: NEGATIVE
Specific Gravity, Urine: 1.036 — ABNORMAL HIGH (ref 1.001–1.03)
pH: 5.5 (ref 5.0–8.0)

## 2018-08-31 LAB — CBC WITH DIFFERENTIAL/PLATELET
Absolute Monocytes: 360 cells/uL (ref 200–950)
Basophils Absolute: 28 cells/uL (ref 0–200)
Basophils Relative: 0.7 %
Eosinophils Absolute: 180 cells/uL (ref 15–500)
Eosinophils Relative: 4.5 %
HCT: 34.4 % — ABNORMAL LOW (ref 35.0–45.0)
Hemoglobin: 11.3 g/dL — ABNORMAL LOW (ref 11.7–15.5)
Lymphs Abs: 1404 cells/uL (ref 850–3900)
MCH: 27.1 pg (ref 27.0–33.0)
MCHC: 32.8 g/dL (ref 32.0–36.0)
MCV: 82.5 fL (ref 80.0–100.0)
MPV: 10.2 fL (ref 7.5–12.5)
Monocytes Relative: 9 %
Neutro Abs: 2028 cells/uL (ref 1500–7800)
Neutrophils Relative %: 50.7 %
Platelets: 290 10*3/uL (ref 140–400)
RBC: 4.17 10*6/uL (ref 3.80–5.10)
RDW: 13.1 % (ref 11.0–15.0)
Total Lymphocyte: 35.1 %
WBC: 4 10*3/uL (ref 3.8–10.8)

## 2018-08-31 LAB — C3 AND C4
C3 Complement: 123 mg/dL (ref 83–193)
C4 Complement: 28 mg/dL (ref 15–57)

## 2018-08-31 LAB — ANTI-DNA ANTIBODY, DOUBLE-STRANDED: ds DNA Ab: 1 IU/mL

## 2018-08-31 NOTE — Progress Notes (Signed)
Hgb and Hct are borderline low.  Please notify patient and forward labs to PCP.   CMP WNL.  Sed rate WNL.  Complements WNL.

## 2018-08-31 NOTE — Progress Notes (Signed)
DsDNA negative.  Labs do not reveal signs of a flare.

## 2018-10-16 ENCOUNTER — Other Ambulatory Visit: Payer: Self-pay | Admitting: Rheumatology

## 2018-10-16 DIAGNOSIS — M359 Systemic involvement of connective tissue, unspecified: Secondary | ICD-10-CM

## 2018-10-16 NOTE — Telephone Encounter (Signed)
Last Visit: 08/30/18 Next Visit: 01/31/19 Labs: 08/30/18 Hgb and Hct are borderline low.  CMP WNL PLQ eye exam: 09/12/17 Normal.   Left message to advise patient she is due to update her PLQ eye exam.   Okay to refill per Dr. Estanislado Pandy

## 2018-10-25 ENCOUNTER — Encounter (HOSPITAL_BASED_OUTPATIENT_CLINIC_OR_DEPARTMENT_OTHER): Payer: Self-pay | Admitting: *Deleted

## 2018-10-25 ENCOUNTER — Other Ambulatory Visit: Payer: Self-pay

## 2018-10-25 ENCOUNTER — Emergency Department (HOSPITAL_BASED_OUTPATIENT_CLINIC_OR_DEPARTMENT_OTHER)
Admission: EM | Admit: 2018-10-25 | Discharge: 2018-10-26 | Disposition: A | Discharge status: Home / Self Care | Payer: Self-pay | Attending: Emergency Medicine | Admitting: Emergency Medicine

## 2018-10-25 DIAGNOSIS — Y999 Unspecified external cause status: Secondary | ICD-10-CM | POA: Insufficient documentation

## 2018-10-25 DIAGNOSIS — Y939 Activity, unspecified: Secondary | ICD-10-CM | POA: Insufficient documentation

## 2018-10-25 DIAGNOSIS — J45909 Unspecified asthma, uncomplicated: Secondary | ICD-10-CM | POA: Insufficient documentation

## 2018-10-25 DIAGNOSIS — S8002XA Contusion of left knee, initial encounter: Secondary | ICD-10-CM | POA: Insufficient documentation

## 2018-10-25 DIAGNOSIS — Z79899 Other long term (current) drug therapy: Secondary | ICD-10-CM | POA: Insufficient documentation

## 2018-10-25 DIAGNOSIS — Y929 Unspecified place or not applicable: Secondary | ICD-10-CM | POA: Insufficient documentation

## 2018-10-25 DIAGNOSIS — W1830XA Fall on same level, unspecified, initial encounter: Secondary | ICD-10-CM | POA: Insufficient documentation

## 2018-10-25 DIAGNOSIS — E039 Hypothyroidism, unspecified: Secondary | ICD-10-CM | POA: Insufficient documentation

## 2018-10-25 DIAGNOSIS — W19XXXA Unspecified fall, initial encounter: Secondary | ICD-10-CM

## 2018-10-25 NOTE — ED Triage Notes (Signed)
Pt /o fall from standing injuring left knee x 2 hrs ago

## 2018-10-26 ENCOUNTER — Emergency Department (HOSPITAL_BASED_OUTPATIENT_CLINIC_OR_DEPARTMENT_OTHER): Payer: Self-pay

## 2018-10-26 MED ORDER — HYDROCODONE-ACETAMINOPHEN 5-325 MG PO TABS: 2.0000 | ORAL_TABLET | ORAL | 0 refills | Status: DC | PRN

## 2018-10-26 MED ORDER — HYDROCODONE-ACETAMINOPHEN 5-325 MG PO TABS
1.0000 | ORAL_TABLET | Freq: Once | ORAL | Status: AC
  Administered 2018-10-26: 01:00:00 1 via ORAL
  Filled 2018-10-26: qty 1

## 2018-10-26 MED ORDER — NAPROXEN 250 MG PO TABS: 500.0000 mg | ORAL_TABLET | Freq: Once | ORAL | Status: DC

## 2018-10-26 NOTE — ED Notes (Signed)
ED Provider at bedside. 

## 2018-10-26 NOTE — ED Provider Notes (Signed)
[facility] Provider Note: Lowella Dell, MD, FACEP  CSN: [id number] MRN: [id number] ARRIVAL: 10/25/18 at 2350 ROOM: MH01/[facility]01   CHIEF COMPLAINT  Knee Injury   HISTORY OF PRESENT ILLNESS  10/26/18 12:00 AM [name] is a 43 y.o. female who had a mechanical fall about an hour ago.  She is complaining of pain in her left knee, specifically at the inferior aspect of her left patella.  She rates her pain is a 7 out of 10, worse with movement or palpation.  She states she is unable to bear weight.  She is having milder pains in her palms and right knee but the left knee was the focus of the fall.   Past Medical History:  Diagnosis Date  . Abnormal Pap smear 1996  . Asthma   . Dyspareunia 2002  . Endometriosis 2004  . H/O amenorrhea 2009  . H/O candidiasis   . H/O cervicitis 2002  . H/O dysmenorrhea 2004  . H/O tinea cruris 2002  . H/O varicella   . H/O vulvovaginitis 2002  . H/O: menorrhagia 2003  . Hx: UTI (urinary tract infection)   . Hypothyroidism   . Libido, decreased 2002  . Menses, irregular 2004  . Ovarian cyst, left 2008  . PCB (post coital bleeding) 2003  . Pelvic pain in female 2007    Past Surgical History:  Procedure Laterality Date  . BREAST SURGERY    . GASTRIC    . NOVASURE ABLATION    . TUBAL LIGATION    . WISDOM TOOTH EXTRACTION  [id number]    Family History  Problem Relation Age of Onset  . Asthma Mother   . Diabetes Mother   . Depression Mother   . Asthma Father   . Heart disease Brother        Heart Murmur  . Arthritis Maternal Grandmother   . Cancer Maternal Grandmother        Breast & liver  . Depression Maternal Grandmother   . Heart disease Maternal Grandfather        MI  . Asthma Maternal Grandfather   . Diabetes Maternal Grandfather   . Kidney disease Maternal Grandfather        Dialysis    Social History   Tobacco Use  . Smoking status: Never Smoker  . Smokeless tobacco: Never Used  Substance Use  Topics  . Alcohol use: No  . Drug use: No    Prior to Admission medications   Medication Sig Start Date End Date Taking? Authorizing Provider  albuterol (PROVENTIL HFA;VENTOLIN HFA) 108 (90 Base) MCG/ACT inhaler Inhale 2 puffs into the lungs every 6 (six) hours as needed for wheezing or shortness of breath.     [provider]  buPROPion (WELLBUTRIN XL) 300 MG 24 hr tablet Take 300 mg by mouth daily.    [provider]  Calcium Carbonate-Vitamin D (CALCIUM 500 + D) 500-125 MG-UNIT TABS take 1 tablet by mouth daily    [provider]  citalopram (CELEXA) 20 MG tablet Take 20 mg by mouth daily.  06/30/17   [provider]  hydroxychloroquine (PLAQUENIL) 200 MG tablet TAKE 1 TABLET BY MOUTH  TWICE DAILY ON MONDAY  THROUGH FRIDAY. 10/16/18   Pollyann Savoy, MD  levothyroxine (SYNTHROID, LEVOTHROID) 125 MCG tablet Take 125 mcg by mouth daily.    [provider]  Multiple Vitamin (THERA) TABS Take 1 tablet by mouth daily.     [provider]  omeprazole (PRILOSEC)  40 MG capsule Take 40 mg by mouth daily.    [provider]  ondansetron (ZOFRAN) 4 MG tablet Take 1 tablet (4 mg total) by mouth every 8 (eight) hours as needed for nausea or vomiting. 07/08/17   Couture, Cortni S, PA-C  TURMERIC PO Take 1 capsule by mouth daily.    [provider]  zolpidem (AMBIEN) 10 MG tablet Take 1 tablet by mouth at bedtime as needed for sleep    [provider]    Allergies Sulfa drugs cross reactors   REVIEW OF SYSTEMS  Negative except as noted here or in the History of Present Illness.   PHYSICAL EXAMINATION  Initial Vital Signs Blood pressure 138/77, pulse 74, temperature 98.3 F (36.8 C), temperature source Oral, resp. rate 16, height 5\' 1"  (1.549 m), weight 84.4 kg, SpO2 100 %.  Examination General: Well-developed, well-nourished female in no acute distress; appearance consistent with age of record HENT: normocephalic;  atraumatic Eyes: pupils equal, round and reactive to light; extraocular muscles intact Neck: supple; nontender Heart: regular rate and rhythm Lungs: clear to auscultation bilaterally Abdomen: soft; nondistended; nontender; bowel sounds present Extremities: No deformity; pulses normal; tenderness and ecchymosis of inferior left patella, quadriceps and patellar tendons intact:    Neurologic: Awake, alert and oriented; motor function intact in all extremities and symmetric; no facial droop Skin: Warm and dry Psychiatric: Normal mood and affect   RESULTS  Summary of this visit's results, reviewed and interpreted by myself:   EKG Interpretation  Date/Time:    Ventricular Rate:    PR Interval:    QRS Duration:   QT Interval:    QTC Calculation:   R Axis:     Text Interpretation:        Laboratory Studies: No results found for this or any previous visit (from the past 24 hour(s)). Imaging Studies: Dg Knee Complete 4 Views Left  Result Date: 10/26/2018 CLINICAL DATA:  Fall, pain EXAM: LEFT KNEE - COMPLETE 4+ VIEW COMPARISON:  None. FINDINGS: No evidence of fracture, dislocation, or joint effusion. No evidence of arthropathy or other focal bone abnormality. Soft tissues are unremarkable. IMPRESSION: No acute osseous abnormality. Electronically Signed   By: Prudencio Pair M.D.   On: 10/26/2018 00:44    ED COURSE and MDM  Nursing notes, initial and subsequent vitals signs, including pulse oximetry, reviewed and interpreted by myself.  Vitals:   10/25/18 2357  BP: 138/77  Pulse: 74  Resp: 16  Temp: 98.3 F (36.8 C)  TempSrc: Oral  SpO2: 100%  Weight: 84.4 kg  Height: 5\' 1"  (1.549 m)   Medications  HYDROcodone-acetaminophen (NORCO/VICODIN) 5-325 MG per tablet 1 tablet (has no administration in time range)    We will place patient in knee immobilizer and crutches to allow her knee to heal.  No evidence of fracture on x-ray and no evidence of tendon rupture on exam.   PROCEDURES  Procedures   ED DIAGNOSES     ICD-10-CM   1. Fall, initial encounter  W19.XXXA   2. Contusion of left patella, initial encounter  S80.02XA        Edina Winningham, Jenny Reichmann, MD 10/26/18 [phone]

## 2019-01-08 ENCOUNTER — Other Ambulatory Visit: Payer: Self-pay | Admitting: Rheumatology

## 2019-01-08 DIAGNOSIS — M359 Systemic involvement of connective tissue, unspecified: Secondary | ICD-10-CM

## 2019-01-09 MED ORDER — HYDROXYCHLOROQUINE SULFATE 200 MG PO TABS: ORAL_TABLET | ORAL | 0 refills | Status: DC

## 2019-01-09 NOTE — Telephone Encounter (Signed)
Last Visit: 08/30/18 Next Visit: 01/31/19 Labs: 08/30/18 Hgb and Hct are borderline low.  CMP WNL PLQ eye exam: 09/12/17 Normal.   Left message to advise patient she is due to update labs and PLQ eye exam.  Okay to refill 30 day supply PLQ?

## 2019-01-09 NOTE — Telephone Encounter (Signed)
ok 

## 2019-01-23 NOTE — Progress Notes (Signed)
Virtual Visit via Video Note  I connected with [name] on 01/31/19 at 10:45 AM EST by a video enabled telemedicine application and verified that I am speaking with the correct person using two identifiers.  Location: Patient: Home  Provider: Clinic  This service was conducted via virtual visit.  Both audio and visual tools were used.  The patient was located at home. I was located in my office.  Consent was obtained prior to the virtual visit and is aware of possible charges through their insurance for this visit.  The patient is an established patient.  Dr. Estanislado Pandy, MD conducted the virtual visit and Hazel Sams, PA-C acted as scribe during the service.  Office staff helped with scheduling follow up visits after the service was conducted.   I discussed the limitations of evaluation and management by telemedicine and the availability of in person appointments. The patient expressed understanding and agreed to proceed.C CC: Medication monitoring  History of Present Illness: [name] is a 44 y.o. female with history of autoimmune disease and osteoarthritis.  She is taking Plaquenil 200 mg 1 tablet by mouth twice daily Monday through Friday. She denies any signs or symptoms of a flare.  She has an occasional rash on her chest. She has not been wearing sunscreen recently. She denies any symptoms of Raynaud's. She denies any oral or nasal ulcerations.  She has not had any sicca symptoms.  She has intermittent discomfort in both knee joints, which is usually worse in the morning. She denies any joint swelling.  She denies any enlarged lymph nodes.  She has chronic fatigue which has been stable.    Review of Systems  Constitutional: Positive for malaise/fatigue. Negative for fever.  HENT: Negative for congestion.        Denies oral or nasal ulcerations   Eyes: Negative for photophobia, pain, discharge and redness.  Respiratory: Negative for cough, shortness of breath and wheezing.    Cardiovascular: Negative for chest pain, palpitations and leg swelling.  Gastrointestinal: Negative for blood in stool, constipation and diarrhea.  Genitourinary: Negative for dysuria and frequency.  Musculoskeletal: Positive for joint pain. Negative for back pain, myalgias and neck pain.  Skin: Positive for rash.  Neurological: Negative for dizziness, weakness and headaches.  Psychiatric/Behavioral: Negative for depression and memory loss. The patient is not nervous/anxious and does not have insomnia.      Observations/Objective:  Physical Exam  Constitutional: She is oriented to person, place, and time and well-developed, well-nourished, and in no distress.  HENT:  Head: Normocephalic and atraumatic.  Eyes: Conjunctivae are normal.  Pulmonary/Chest: Effort normal.  Neurological: She is alert and oriented to person, place, and time.  Psychiatric: Mood, memory, affect and judgment normal.     Patient reports morning stiffness for 10   minutes.   Patient denies nocturnal pain.  Difficulty dressing/grooming: Denies Difficulty climbing stairs: Denies Difficulty getting out of chair: Denies Difficulty using hands for taps, buttons, cutlery, and/or writing: Denies  Assessment and Plan: Visit Diagnoses: Autoimmune disease (HCC)positive ANA, positive Ro and positive La antibody. malar rash, history of oral ulcers and sicca symptoms -She has not had any signs or symptoms of a flare.  She is clinically doing well on Plaquenil 200 mg 1 tablet by mouth twice daily M-F. She had lab work on 08/30/18, which revealed complements WNL, dsDNA 1, and ESR 17.  She has occasional arthralgias in the morning but no joint inflammation. She has not had any sicca symptoms or  recent oral or nasal ulcerations.  She has not had any symptoms of Raynaud's.  She has recurrent rashes on her chest.  She has not been wearing sunscreen daily, but we stressed the importance of wearing sunscreen >SPF 50 daily. She has not  had any recent facial rashes.   She denies any enlarged lymph nodes.  She will continue taking plaquenil as prescribed. She was advised to notify us if she develops any new or worsening symptoms.  She is due to update lab work, so we will place future orders for autoimmune lab work.  She will follow up in 4 months.   High risk medication use - Plaquenil 200 mg 1 tablet twice daily Monday through Friday. eye exam: 09/12/17. She is overdue to update PLQ eye exam. CBC and CMP were drawn on 08/30/18.  She is due to update lab work.   Primary osteoarthritis of both knees: She experiences discomfort and stiffness in both knee joints.  She denies any joint swelling at this time. She does not have any difficulty climbing steps or getting up from a chair.   Plantar fasciitis of right foot: Resolved.  Osteopenia of multiple sites: She is taking a calcium and vitamin D supplement.   Primary insomnia -She takes Ambien 10 mg PRN at bedtime for insomnia.   Other medical conditions are listed as follows:  History of hypothyroidism  History of depression  History of gastroesophageal reflux (GERD)  Follow Up Instructions: She will follow up in 4 months    I discussed the assessment and treatment plan with the patient. The patient was provided an opportunity to ask questions and all were answered. The patient agreed with the plan and demonstrated an understanding of the instructions.   The patient was advised to call back or seek an in-person evaluation if the symptoms worsen or if the condition fails to improve as anticipated.  I provided 15 minutes of non-face-to-face time during this encounter.  Bo Merino, MD   Scribed by-  Hazel Sams, PA-C

## 2019-01-31 ENCOUNTER — Telehealth (INDEPENDENT_AMBULATORY_CARE_PROVIDER_SITE_OTHER): Payer: 59 | Admitting: Rheumatology

## 2019-01-31 ENCOUNTER — Encounter: Payer: Self-pay | Admitting: Rheumatology

## 2019-01-31 ENCOUNTER — Other Ambulatory Visit: Payer: Self-pay

## 2019-01-31 DIAGNOSIS — Z8719 Personal history of other diseases of the digestive system: Secondary | ICD-10-CM

## 2019-01-31 DIAGNOSIS — Z79899 Other long term (current) drug therapy: Secondary | ICD-10-CM

## 2019-01-31 DIAGNOSIS — M359 Systemic involvement of connective tissue, unspecified: Secondary | ICD-10-CM

## 2019-01-31 DIAGNOSIS — F5101 Primary insomnia: Secondary | ICD-10-CM

## 2019-01-31 DIAGNOSIS — Z8639 Personal history of other endocrine, nutritional and metabolic disease: Secondary | ICD-10-CM

## 2019-01-31 DIAGNOSIS — M8589 Other specified disorders of bone density and structure, multiple sites: Secondary | ICD-10-CM

## 2019-01-31 DIAGNOSIS — Z8659 Personal history of other mental and behavioral disorders: Secondary | ICD-10-CM

## 2019-01-31 DIAGNOSIS — M722 Plantar fascial fibromatosis: Secondary | ICD-10-CM

## 2019-01-31 DIAGNOSIS — M170 Bilateral primary osteoarthritis of knee: Secondary | ICD-10-CM

## 2019-02-05 ENCOUNTER — Other Ambulatory Visit: Payer: Self-pay

## 2019-02-05 DIAGNOSIS — M359 Systemic involvement of connective tissue, unspecified: Secondary | ICD-10-CM

## 2019-02-05 DIAGNOSIS — Z79899 Other long term (current) drug therapy: Secondary | ICD-10-CM

## 2019-02-06 ENCOUNTER — Telehealth: Payer: Self-pay | Admitting: *Deleted

## 2019-02-06 ENCOUNTER — Other Ambulatory Visit: Payer: Self-pay | Admitting: *Deleted

## 2019-02-06 DIAGNOSIS — M359 Systemic involvement of connective tissue, unspecified: Secondary | ICD-10-CM

## 2019-02-06 LAB — CBC WITH DIFFERENTIAL/PLATELET
Absolute Monocytes: 360 cells/uL (ref 200–950)
Basophils Absolute: 41 cells/uL (ref 0–200)
Basophils Relative: 0.9 %
Eosinophils Absolute: 221 cells/uL (ref 15–500)
Eosinophils Relative: 4.9 %
HCT: 32.6 % — ABNORMAL LOW (ref 35.0–45.0)
Hemoglobin: 10.1 g/dL — ABNORMAL LOW (ref 11.7–15.5)
Lymphs Abs: 797 cells/uL — ABNORMAL LOW (ref 850–3900)
MCH: 24.2 pg — ABNORMAL LOW (ref 27.0–33.0)
MCHC: 31 g/dL — ABNORMAL LOW (ref 32.0–36.0)
MCV: 78.2 fL — ABNORMAL LOW (ref 80.0–100.0)
MPV: 10 fL (ref 7.5–12.5)
Monocytes Relative: 8 %
Neutro Abs: 3083 cells/uL (ref 1500–7800)
Neutrophils Relative %: 68.5 %
Platelets: 316 10*3/uL (ref 140–400)
RBC: 4.17 10*6/uL (ref 3.80–5.10)
RDW: 13 % (ref 11.0–15.0)
Total Lymphocyte: 17.7 %
WBC: 4.5 10*3/uL (ref 3.8–10.8)

## 2019-02-06 LAB — COMPLETE METABOLIC PANEL WITH GFR
AG Ratio: 1.3 (calc) (ref 1.0–2.5)
ALT: 10 U/L (ref 6–29)
AST: 14 U/L (ref 10–35)
Albumin: 4 g/dL (ref 3.6–5.1)
Alkaline phosphatase (APISO): 58 U/L (ref 31–125)
BUN: 19 mg/dL (ref 7–25)
CO2: 25 mmol/L (ref 20–32)
Calcium: 9.2 mg/dL (ref 8.6–10.2)
Chloride: 107 mmol/L (ref 98–110)
Creat: 0.83 mg/dL (ref 0.50–1.10)
GFR, Est African American: 99 mL/min/{1.73_m2} (ref >=60)
GFR, Est Non African American: 85 mL/min/{1.73_m2} (ref >=60)
Globulin: 3 g/dL (calc) (ref 1.9–3.7)
Glucose, Bld: 92 mg/dL (ref 65–99)
Potassium: 4.1 mmol/L (ref 3.5–5.3)
Sodium: 140 mmol/L (ref 135–146)
Total Bilirubin: 0.2 mg/dL (ref 0.2–1.2)
Total Protein: 7 g/dL (ref 6.1–8.1)

## 2019-02-06 LAB — URINALYSIS, ROUTINE W REFLEX MICROSCOPIC
Bilirubin Urine: NEGATIVE
Glucose, UA: NEGATIVE
Hgb urine dipstick: NEGATIVE
Hyaline Cast: NONE SEEN /LPF
Ketones, ur: NEGATIVE
Nitrite: NEGATIVE
Specific Gravity, Urine: 1.03 (ref 1.001–1.03)
pH: 5.5 (ref 5.0–8.0)

## 2019-02-06 LAB — SEDIMENTATION RATE: Sed Rate: 25 mm/h — ABNORMAL HIGH (ref 0–20)

## 2019-02-06 LAB — C3 AND C4
C3 Complement: 121 mg/dL (ref 83–193)
C4 Complement: 25 mg/dL (ref 15–57)

## 2019-02-06 LAB — ANTI-DNA ANTIBODY, DOUBLE-STRANDED: ds DNA Ab: <1 IU/mL

## 2019-02-06 MED ORDER — HYDROXYCHLOROQUINE SULFATE 200 MG PO TABS: ORAL_TABLET | ORAL | 0 refills | Status: DC

## 2019-02-06 NOTE — Progress Notes (Signed)
Sed rate is elevated.  Rest the labs are pending.  I called patient and discussed labs with her.  She states she has no symptoms of urinary tract infection.  I have advised her to contact her PCP in case she develops symptoms.  She is also anemic and I am uncertain about the etiology.

## 2019-02-06 NOTE — Telephone Encounter (Signed)
Patient does not have vision insurance until 03/04/2019. Patient will get PLQ eye exam performed at that time.

## 2019-02-12 ENCOUNTER — Encounter: Payer: Self-pay | Admitting: Rheumatology

## 2019-02-14 ENCOUNTER — Telehealth: Payer: Self-pay | Admitting: *Deleted

## 2019-02-14 NOTE — Telephone Encounter (Signed)
Submitted a Prior Authorization request to [employer] for PLQ via Cover My Meds. Will update once we receive a response.   

## 2019-02-28 NOTE — Telephone Encounter (Signed)
Received notification from [facility] regarding a prior authorization for PLQ. Authorization has been APPROVED from  02/14/2019 through 02/12/2022

## 2019-03-08 ENCOUNTER — Other Ambulatory Visit: Payer: Self-pay | Admitting: Rheumatology

## 2019-03-08 DIAGNOSIS — M359 Systemic involvement of connective tissue, unspecified: Secondary | ICD-10-CM

## 2019-03-08 NOTE — Telephone Encounter (Signed)
Ok to refill 30 day supply of PLQ.

## 2019-03-08 NOTE — Telephone Encounter (Signed)
Last Visit: 01/31/19 Next Visit: 05/28/19 Labs: 02/05/19 Eye exam: 09/12/17 Normal.  Left message to advise patient she is due to update PLQ eye exam. (Pt was without insurance until 03/04/2019)  Okay to refill Plaquenil?

## 2019-04-18 ENCOUNTER — Encounter: Payer: Self-pay | Admitting: Rheumatology

## 2019-04-18 NOTE — Telephone Encounter (Signed)
Advised patient to see an orthopedic surgeon.

## 2019-04-24 ENCOUNTER — Encounter: Payer: Self-pay | Admitting: Rheumatology

## 2019-04-24 ENCOUNTER — Other Ambulatory Visit: Payer: Self-pay | Admitting: Rheumatology

## 2019-04-24 DIAGNOSIS — M359 Systemic involvement of connective tissue, unspecified: Secondary | ICD-10-CM

## 2019-04-24 MED ORDER — HYDROXYCHLOROQUINE SULFATE 200 MG PO TABS: ORAL_TABLET | ORAL | 0 refills | Status: DC

## 2019-04-24 NOTE — Telephone Encounter (Signed)
Last Visit: 01/31/2019 telemedicine  Next Visit: 05/28/2019  Labs: 02/05/2019 CMP WNL.  Hgb and Hct are low and trending down.  Eye exam:09/12/2017 Per patient, eye exam is scheduled for 04/30/2019  Okay to refill Plaquenil?

## 2019-04-24 NOTE — Telephone Encounter (Signed)
Ok to refill PLQ.

## 2019-05-16 NOTE — Progress Notes (Signed)
Office Visit Note  Patient: [name]             Date of Birth: May 03, 1975           MRN: [id number]             PCP: Helane Rima, MD Referring: Helane Rima, MD Visit Date: 05/28/2019 Occupation: @GUAROCC @  Subjective:  Pain in both knees   History of Present Illness: [name] is a 44 y.o. female with history of autoimmune disease and osteoarthritis.  Patient has been out of Plaquenil for the past 1 month.  According to the patient she got a new insurance and they will no longer cover Plaquenil until April 2022.  She plans on talking to her insurance company today.  She denies any new or worsening symptoms since discontinuing Plaquenil.  She continues to have chronic pain in both knee joints but denies any joint swelling.  She has more difficulty going up and down steps recently.  She denies any other joint pain or joint swelling at this time.  She denies any recent symptoms of Raynaud's, hair loss, enlarged lymph nodes, oral or nasal ulcerations, or sicca symptoms.  She has rashes on her chest intermittently.  She has not been wearing sunscreen recently. She continues to have chronic fatigue which has been stable.   Activities of Daily Living:  Patient reports morning stiffness for 2 minutes.   Patient Denies nocturnal pain.  Difficulty dressing/grooming: Denies Difficulty climbing stairs: Reports Difficulty getting out of chair: Denies Difficulty using hands for taps, buttons, cutlery, and/or writing: Denies  Review of Systems  Constitutional: Positive for fatigue.  HENT: Negative for mouth sores, mouth dryness and nose dryness.   Eyes: Negative for pain, visual disturbance and dryness.  Respiratory: Negative for cough, hemoptysis, shortness of breath and difficulty breathing.   Cardiovascular: Negative for chest pain, palpitations, hypertension and swelling in legs/feet.  Gastrointestinal: Negative for blood in stool, constipation and diarrhea.  Endocrine:  Negative for increased urination.  Genitourinary: Negative for difficulty urinating and painful urination.  Musculoskeletal: Positive for arthralgias, joint pain and morning stiffness. Negative for joint swelling, myalgias, muscle weakness, muscle tenderness and myalgias.  Skin: Positive for rash. Negative for color change, pallor, hair loss, nodules/bumps, skin tightness, ulcers and sensitivity to sunlight.  Allergic/Immunologic: Negative for susceptible to infections.  Neurological: Negative for dizziness, numbness, headaches and weakness.  Hematological: Negative for swollen glands.  Psychiatric/Behavioral: Positive for sleep disturbance. Negative for depressed mood. The patient is not nervous/anxious.     PMFS History:  Patient Active Problem List   Diagnosis Date Noted  . Primary insomnia 04/04/2017  . 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 malar rash, history of oral ulcers and sicca symptoms 07/22/2016  . High risk medication use 07/22/2016  . History of hypothyroidism 07/22/2016  . History of gastroesophageal reflux (GERD) 07/22/2016  . Osteopenia of multiple sites 07/22/2016  . History of depression 07/22/2016    Past Medical History:  Diagnosis Date  . Abnormal Pap smear 1996  . Asthma   . Dyspareunia 2002  . Endometriosis 2004  . H/O amenorrhea 2009  . H/O candidiasis   . H/O cervicitis 2002  . H/O dysmenorrhea 2004  . H/O tinea cruris 2002  . H/O varicella   . H/O vulvovaginitis 2002  . H/O: menorrhagia 2003  . Hx: UTI (urinary tract infection)   . Hypothyroidism   . Libido, decreased 2002  . Menses, irregular 2004  .  Ovarian cyst, left 2008  . PCB (post coital bleeding) 2003  . Pelvic pain in female 2007    Family History  Problem Relation Age of Onset  . Asthma Mother   . Diabetes Mother   . Depression Mother   . Asthma Father   . Heart disease Brother        Heart Murmur  . Arthritis Maternal Grandmother   .  Cancer Maternal Grandmother        Breast & liver  . Depression Maternal Grandmother   . Heart disease Maternal Grandfather        MI  . Asthma Maternal Grandfather   . Diabetes Maternal Grandfather   . Kidney disease Maternal Grandfather        Dialysis   Past Surgical History:  Procedure Laterality Date  . BREAST SURGERY    . GASTRIC    . NOVASURE ABLATION    . TUBAL LIGATION    . WISDOM TOOTH EXTRACTION  [id number]   Social History   Social History Narrative  . Not on file    There is no immunization history on file for this patient.   Objective: Vital Signs: BP 116/72 (BP Location: Left Arm, Patient Position: Sitting, Cuff Size: Normal)   Pulse 99   Resp 14   Ht 5' 1"  (1.549 m)   Wt 189 lb 9.6 oz (86 kg)   BMI 35.82 kg/m    Physical Exam Vitals and nursing note reviewed.  Constitutional:      Appearance: She is well-developed.  HENT:     Head: Normocephalic and atraumatic.  Eyes:     Conjunctiva/sclera: Conjunctivae normal.  Pulmonary:     Effort: Pulmonary effort is normal.  Abdominal:     General: Bowel sounds are normal.     Palpations: Abdomen is soft.  Musculoskeletal:     Cervical back: Normal range of motion.  Lymphadenopathy:     Cervical: No cervical adenopathy.  Skin:    General: Skin is warm and dry.     Capillary Refill: Capillary refill takes less than 2 seconds.  Neurological:     Mental Status: She is alert and oriented to person, place, and time.  Psychiatric:        Behavior: Behavior normal.      Musculoskeletal Exam: C-spine, thoracic spine, and lumbar spine good ROM. Shoulder joints, elbow joints, wrist joints, MCPs, PIPs, and DIPs good ROM with no synovitis. Complete fist formation bilaterally. Hip joints, knee joints, and ankle joints.  Bilateral knee crepitus.  No warmth or effusion of knee joints.  No tenderness or swelling of ankle joints.   CDAI Exam: CDAI Score: -- Patient Global: --; Provider Global: -- Swollen: --;  Tender: -- Joint Exam 05/28/2019   No joint exam has been documented for this visit   There is currently no information documented on the homunculus. Go to the Rheumatology activity and complete the homunculus joint exam.  Investigation: No additional findings.  Imaging: No results found.  Recent Labs: Lab Results  Component Value Date   WBC 4.5 02/05/2019   HGB 10.1 (L) 02/05/2019   PLT 316 02/05/2019   NA 140 02/05/2019   K 4.1 02/05/2019   CL 107 02/05/2019   CO2 25 02/05/2019   GLUCOSE 92 02/05/2019   BUN 19 02/05/2019   CREATININE 0.83 02/05/2019   BILITOT 0.2 02/05/2019   ALKPHOS 50 07/07/2017   AST 14 02/05/2019   ALT 10 02/05/2019   PROT 7.0 02/05/2019  ALBUMIN 3.9 07/07/2017   CALCIUM 9.2 02/05/2019   GFRAA 99 02/05/2019    Speciality Comments: PLQ eye exam: 09/12/17 Normal. [facility] Follow up in 12 months.  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 malar rash, history of oral ulcers and sicca symptoms: She has not had any signs or symptoms of a systemic autoimmune disease flare.  She ran out of her prescription for Plaquenil 1 month ago due to her new insurance not covering the prescription.  She will be calling her insurance company today to further discuss, and she was advised to let us know if we need to complete a prior authorization.  She has not developed any new or worsening symptoms since discontinuing plaquenil.  She has no synovitis on exam.  She has been experiencing increased pain in both knee joints, especially with stairs. Bilateral knee crepitus but no warmth or effusion noted.  She is not having any other joint pain or joint swelling at this time. She continues to have rashes on her chest intermittently. We discussed the importance of wearing sunscreen SPF>50 on a daily basis, avoiding  direct sun exposure, and wearing sun protective clothing/hat. She has not had any recent oral or nasal ulcerations, sicca symptoms, enlarged lymph nodes, symptoms of Raynaud's, chest pain, or shortness of breath.  Autoimmune lab work from 02/05/19 was reviewed with the patient today in the office.  ESR 25, complements WNL, and dsDNA negative.  She will notify us once she has more information about the lack of coverage for PLQ, and we will help in any way we can.  She will follow up in 5 months.   High risk medication use - Plaquenil 200 mg 1 tablet twice daily Monday through Friday-no covered by new insurance. PLQ eye exam: 09/12/17. She was given a PLQ eye exam form to take with her to her upcoming appointment.  CBC and CMP were drawn on 02/05/19.    Primary osteoarthritis of both knees: She presents today with increased pain in both knee joints over the past several weeks.  She has had increased difficulty going up and down steps.  She has not noticed any warmth or joint swelling.  She has no mechanical symptoms at this time.  According to the patient she has felt on both knees twice within the past year.  She has good range of motion of both knee joints on exam today.  No warmth or effusion was noted.  She has bilateral knee crepitus.  X-rays from 04/04/2017 were reviewed today in the office.  Updated x-rays were obtained which did not reveal much radiographic progression.  We discussed the importance of lower extremity muscle strengthening.  She was given a handout of knee joint exercises to perform.  We also discussed the importance of weight loss.  She can use Voltaren gel topically as needed for pain relief.  She was advised to notify us if she develops increased joint pain or joint swelling.  Chronic pain of both knees -X-rays from 04/04/17 were reviewed today.  Updated x-rays of both knees were obtained today for comparison.  She has been experiencing increased pain in both knee joints, especially with steps.   No warmth or effusion noted. We discussed working on lower extremity muscle strengthening, weight loss, and using voltaren gel topically as needed for pain relief. Plan: XR KNEE 3 VIEW  RIGHT, XR KNEE 3 VIEW LEFT  Plantar fasciitis of right foot - Resolved.  Osteopenia of multiple sites: She is due to update DEXA.  She is planning on reaching out to her PCP to place this order.  She is taking a calcium and vitamin D supplement daily.    Primary insomnia - She takes Ambien 10 mg PRN at bedtime for insomnia.  Other medical conditions are listed as follows:   History of depression  History of gastroesophageal reflux (GERD)  History of hypothyroidism    Orders: Orders Placed This Encounter  Procedures  . XR KNEE 3 VIEW RIGHT  . XR KNEE 3 VIEW LEFT   No orders of the defined types were placed in this encounter.   Face-to-face time spent with patient was 30 minutes. Greater than 50% of time was spent in counseling and coordination of care.  Follow-Up Instructions: Return in about 5 months (around 10/28/2019) for Autoimmune Disease, Osteoarthritis.   Ofilia Neas, PA-C  Note - This record has been created using Dragon software.  Chart creation errors have been sought, but may not always  have been located. Such creation errors do not reflect on  the standard of medical care.

## 2019-05-27 ENCOUNTER — Telehealth: Payer: Self-pay | Admitting: Rheumatology

## 2019-05-27 NOTE — Telephone Encounter (Signed)
Attempted to contact patient and left message on machine to advise patient to call her insurance company to find out who is in network.

## 2019-05-27 NOTE — Telephone Encounter (Signed)
Patient called to confirm her appointment tomorrow 05/28/19 at 2:00 pm.  Patient states she has not had her Plaquenil eye exam because her [employer] insurance has changed and has not been able to find an eye doctor in network.

## 2019-05-28 ENCOUNTER — Ambulatory Visit: Payer: Self-pay

## 2019-05-28 ENCOUNTER — Encounter: Payer: Self-pay | Admitting: Physician Assistant

## 2019-05-28 ENCOUNTER — Ambulatory Visit: Payer: Managed Care, Other (non HMO) | Admitting: Physician Assistant

## 2019-05-28 ENCOUNTER — Other Ambulatory Visit: Payer: Self-pay

## 2019-05-28 VITALS — BP 116/72 | HR 99 | Resp 14 | Ht 61.0 in | Wt 189.6 lb

## 2019-05-28 DIAGNOSIS — M25562 Pain in left knee: Secondary | ICD-10-CM | POA: Diagnosis not present

## 2019-05-28 DIAGNOSIS — M722 Plantar fascial fibromatosis: Secondary | ICD-10-CM | POA: Diagnosis not present

## 2019-05-28 DIAGNOSIS — Z8659 Personal history of other mental and behavioral disorders: Secondary | ICD-10-CM

## 2019-05-28 DIAGNOSIS — Z79899 Other long term (current) drug therapy: Secondary | ICD-10-CM

## 2019-05-28 DIAGNOSIS — F5101 Primary insomnia: Secondary | ICD-10-CM

## 2019-05-28 DIAGNOSIS — M8589 Other specified disorders of bone density and structure, multiple sites: Secondary | ICD-10-CM

## 2019-05-28 DIAGNOSIS — M359 Systemic involvement of connective tissue, unspecified: Secondary | ICD-10-CM

## 2019-05-28 DIAGNOSIS — G8929 Other chronic pain: Secondary | ICD-10-CM

## 2019-05-28 DIAGNOSIS — Z8639 Personal history of other endocrine, nutritional and metabolic disease: Secondary | ICD-10-CM

## 2019-05-28 DIAGNOSIS — M25561 Pain in right knee: Secondary | ICD-10-CM | POA: Diagnosis not present

## 2019-05-28 DIAGNOSIS — Z8719 Personal history of other diseases of the digestive system: Secondary | ICD-10-CM

## 2019-05-28 DIAGNOSIS — M170 Bilateral primary osteoarthritis of knee: Secondary | ICD-10-CM | POA: Diagnosis not present

## 2019-05-28 NOTE — Patient Instructions (Signed)
Journal for Nurse Practitioners, 15(4), 263-267. Retrieved October 09, 2017 from http://clinicalkey.com/nursing">  Knee Exercises Ask your health care provider which exercises are safe for you. Do exercises exactly as told by your health care provider and adjust them as directed. It is normal to feel mild stretching, pulling, tightness, or discomfort as you do these exercises. Stop right away if you feel sudden pain or your pain gets worse. Do not begin these exercises until told by your health care provider. Stretching and range-of-motion exercises These exercises warm up your muscles and joints and improve the movement and flexibility of your knee. These exercises also help to relieve pain and swelling. Knee extension, prone 1. Lie on your abdomen (prone position) on a bed. 2. Place your left / right knee just beyond the edge of the surface so your knee is not on the bed. You can put a towel under your left / right thigh just above your kneecap for comfort. 3. Relax your leg muscles and allow gravity to straighten your knee (extension). You should feel a stretch behind your left / right knee. 4. Hold this position for __________ seconds. 5. Scoot up so your knee is supported between repetitions. Repeat __________ times. Complete this exercise __________ times a day. Knee flexion, active  1. Lie on your back with both legs straight. If this causes back discomfort, bend your left / right knee so your foot is flat on the floor. 2. Slowly slide your left / right heel back toward your buttocks. Stop when you feel a gentle stretch in the front of your knee or thigh (flexion). 3. Hold this position for __________ seconds. 4. Slowly slide your left / right heel back to the starting position. Repeat __________ times. Complete this exercise __________ times a day. Quadriceps stretch, prone  1. Lie on your abdomen on a firm surface, such as a bed or padded floor. 2. Bend your left / right knee and hold  your ankle. If you cannot reach your ankle or pant leg, loop a belt around your foot and grab the belt instead. 3. Gently pull your heel toward your buttocks. Your knee should not slide out to the side. You should feel a stretch in the front of your thigh and knee (quadriceps). 4. Hold this position for __________ seconds. Repeat __________ times. Complete this exercise __________ times a day. Hamstring, supine 1. Lie on your back (supine position). 2. Loop a belt or towel over the ball of your left / right foot. The ball of your foot is on the walking surface, right under your toes. 3. Straighten your left / right knee and slowly pull on the belt to raise your leg until you feel a gentle stretch behind your knee (hamstring). ? Do not let your knee bend while you do this. ? Keep your other leg flat on the floor. 4. Hold this position for __________ seconds. Repeat __________ times. Complete this exercise __________ times a day. Strengthening exercises These exercises build strength and endurance in your knee. Endurance is the ability to use your muscles for a long time, even after they get tired. Quadriceps, isometric This exercise stretches the muscles in front of your thigh (quadriceps) without moving your knee joint (isometric). 1. Lie on your back with your left / right leg extended and your other knee bent. Put a rolled towel or small pillow under your knee if told by your health care provider. 2. Slowly tense the muscles in the front of your left /   right thigh. You should see your kneecap slide up toward your hip or see increased dimpling just above the knee. This motion will push the back of the knee toward the floor. 3. For __________ seconds, hold the muscle as tight as you can without increasing your pain. 4. Relax the muscles slowly and completely. Repeat __________ times. Complete this exercise __________ times a day. Straight leg raises This exercise stretches the muscles in front  of your thigh (quadriceps) and the muscles that move your hips (hip flexors). 1. Lie on your back with your left / right leg extended and your other knee bent. 2. Tense the muscles in the front of your left / right thigh. You should see your kneecap slide up or see increased dimpling just above the knee. Your thigh may even shake a bit. 3. Keep these muscles tight as you raise your leg 4-6 inches (10-15 cm) off the floor. Do not let your knee bend. 4. Hold this position for __________ seconds. 5. Keep these muscles tense as you lower your leg. 6. Relax your muscles slowly and completely after each repetition. Repeat __________ times. Complete this exercise __________ times a day. Hamstring, isometric 1. Lie on your back on a firm surface. 2. Bend your left / right knee about __________ degrees. 3. Dig your left / right heel into the surface as if you are trying to pull it toward your buttocks. Tighten the muscles in the back of your thighs (hamstring) to "dig" as hard as you can without increasing any pain. 4. Hold this position for __________ seconds. 5. Release the tension gradually and allow your muscles to relax completely for __________ seconds after each repetition. Repeat __________ times. Complete this exercise __________ times a day. Hamstring curls If told by your health care provider, do this exercise while wearing ankle weights. Begin with __________ lb weights. Then increase the weight by 1 lb (0.5 kg) increments. Do not wear ankle weights that are more than __________ lb. 1. Lie on your abdomen with your legs straight. 2. Bend your left / right knee as far as you can without feeling pain. Keep your hips flat against the floor. 3. Hold this position for __________ seconds. 4. Slowly lower your leg to the starting position. Repeat __________ times. Complete this exercise __________ times a day. Squats This exercise strengthens the muscles in front of your thigh and knee  (quadriceps). 1. Stand in front of a table, with your feet and knees pointing straight ahead. You may rest your hands on the table for balance but not for support. 2. Slowly bend your knees and lower your hips like you are going to sit in a chair. ? Keep your weight over your heels, not over your toes. ? Keep your lower legs upright so they are parallel with the table legs. ? Do not let your hips go lower than your knees. ? Do not bend lower than told by your health care provider. ? If your knee pain increases, do not bend as low. 3. Hold the squat position for __________ seconds. 4. Slowly push with your legs to return to standing. Do not use your hands to pull yourself to standing. Repeat __________ times. Complete this exercise __________ times a day. Wall slides This exercise strengthens the muscles in front of your thigh and knee (quadriceps). 1. Lean your back against a smooth wall or door, and walk your feet out 18-24 inches (46-61 cm) from it. 2. Place your feet hip-width apart. 3.   Slowly slide down the wall or door until your knees bend __________ degrees. Keep your knees over your heels, not over your toes. Keep your knees in line with your hips. 4. Hold this position for __________ seconds. Repeat __________ times. Complete this exercise __________ times a day. Straight leg raises This exercise strengthens the muscles that rotate the leg at the hip and move it away from your body (hip abductors). 1. Lie on your side with your left / right leg in the top position. Lie so your head, shoulder, knee, and hip line up. You may bend your bottom knee to help you keep your balance. 2. Roll your hips slightly forward so your hips are stacked directly over each other and your left / right knee is facing forward. 3. Leading with your heel, lift your top leg 4-6 inches (10-15 cm). You should feel the muscles in your outer hip lifting. ? Do not let your foot drift forward. ? Do not let your knee  roll toward the ceiling. 4. Hold this position for __________ seconds. 5. Slowly return your leg to the starting position. 6. Let your muscles relax completely after each repetition. Repeat __________ times. Complete this exercise __________ times a day. Straight leg raises This exercise stretches the muscles that move your hips away from the front of the pelvis (hip extensors). 1. Lie on your abdomen on a firm surface. You can put a pillow under your hips if that is more comfortable. 2. Tense the muscles in your buttocks and lift your left / right leg about 4-6 inches (10-15 cm). Keep your knee straight as you lift your leg. 3. Hold this position for __________ seconds. 4. Slowly lower your leg to the starting position. 5. Let your leg relax completely after each repetition. Repeat __________ times. Complete this exercise __________ times a day. This information is not intended to replace advice given to you by your health care provider. Make sure you discuss any questions you have with your health care provider. Document Revised: 10/10/2017 Document Reviewed: 10/10/2017 Elsevier Patient Education  2020 [employer]  

## 2019-07-04 ENCOUNTER — Other Ambulatory Visit: Payer: Self-pay | Admitting: Physician Assistant

## 2019-07-04 DIAGNOSIS — M359 Systemic involvement of connective tissue, unspecified: Secondary | ICD-10-CM

## 2019-07-05 NOTE — Telephone Encounter (Signed)
Last Visit:  05/28/2019 Next Visit: 10/29/2019 Labs: 02/05/2019 Hgb 10.1, HCT 32.6, MVC 78.2, MCH 24.2, MCHC 31.0 Lymphs Abs 797 PLQ eye exam: 06/25/2019 WNL   Current Dose per office note on 05/29/2019: Plaquenil 200 mg 1 tablet twice daily Monday through Friday DX: Autoimmune disease   Okay to refill PLQ?

## 2019-07-18 ENCOUNTER — Telehealth: Payer: Self-pay | Admitting: *Deleted

## 2019-07-18 NOTE — Telephone Encounter (Signed)
Received health and safety for HYDROXYCHLOROQUINE SULFATE and CITALOPRAM    Patient advised per Dr. Corliss Skains discuss use of another antidepressant medication than Celexa due to interaction with Plaquenil and risk of increased QTc interval.

## 2019-08-29 ENCOUNTER — Other Ambulatory Visit: Payer: Self-pay | Admitting: *Deleted

## 2019-08-29 DIAGNOSIS — M359 Systemic involvement of connective tissue, unspecified: Secondary | ICD-10-CM

## 2019-08-29 MED ORDER — HYDROXYCHLOROQUINE SULFATE 200 MG PO TABS: ORAL_TABLET | ORAL | 0 refills | Status: DC

## 2019-08-29 NOTE — Telephone Encounter (Signed)
Refill request received via fax  Last Visit:  05/28/2019 Next Visit: 10/29/2019 Labs: 02/05/2019 Hgb 10.1, HCT 32.6, MVC 78.2, MCH 24.2, MCHC 31.0 Lymphs Abs 797 PLQ eye exam: 06/25/2019 WNL   Current Dose per office note on 05/29/2019: Plaquenil 200 mg 1 tablet twice daily Monday through Friday DX: Autoimmune disease    Left message to advise patient she is due to update labs.  Okay to refill 30 day supply PLQ?

## 2019-09-03 ENCOUNTER — Other Ambulatory Visit: Payer: Self-pay | Admitting: *Deleted

## 2019-09-03 DIAGNOSIS — Z79899 Other long term (current) drug therapy: Secondary | ICD-10-CM

## 2019-09-04 LAB — COMPLETE METABOLIC PANEL WITH GFR
AG Ratio: 1.5 (calc) (ref 1.0–2.5)
ALT: 6 U/L (ref 6–29)
AST: 10 U/L (ref 10–35)
Albumin: 4.1 g/dL (ref 3.6–5.1)
Alkaline phosphatase (APISO): 59 U/L (ref 31–125)
BUN: 18 mg/dL (ref 7–25)
CO2: 24 mmol/L (ref 20–32)
Calcium: 9 mg/dL (ref 8.6–10.2)
Chloride: 108 mmol/L (ref 98–110)
Creat: 0.68 mg/dL (ref 0.50–1.10)
GFR, Est African American: 122 mL/min/{1.73_m2} (ref >=60)
GFR, Est Non African American: 105 mL/min/{1.73_m2} (ref >=60)
Globulin: 2.8 g/dL (calc) (ref 1.9–3.7)
Glucose, Bld: 89 mg/dL (ref 65–99)
Potassium: 4.2 mmol/L (ref 3.5–5.3)
Sodium: 141 mmol/L (ref 135–146)
Total Bilirubin: 0.2 mg/dL (ref 0.2–1.2)
Total Protein: 6.9 g/dL (ref 6.1–8.1)

## 2019-09-04 LAB — CBC WITH DIFFERENTIAL/PLATELET
Absolute Monocytes: 392 cells/uL (ref 200–950)
Basophils Absolute: 29 cells/uL (ref 0–200)
Basophils Relative: 0.8 %
Eosinophils Absolute: 79 cells/uL (ref 15–500)
Eosinophils Relative: 2.2 %
HCT: 31.2 % — ABNORMAL LOW (ref 35.0–45.0)
Hemoglobin: 9.4 g/dL — ABNORMAL LOW (ref 11.7–15.5)
Lymphs Abs: 742 cells/uL — ABNORMAL LOW (ref 850–3900)
MCH: 22.4 pg — ABNORMAL LOW (ref 27.0–33.0)
MCHC: 30.1 g/dL — ABNORMAL LOW (ref 32.0–36.0)
MCV: 74.5 fL — ABNORMAL LOW (ref 80.0–100.0)
MPV: 10.6 fL (ref 7.5–12.5)
Monocytes Relative: 10.9 %
Neutro Abs: 2358 cells/uL (ref 1500–7800)
Neutrophils Relative %: 65.5 %
Platelets: 267 10*3/uL (ref 140–400)
RBC: 4.19 10*6/uL (ref 3.80–5.10)
RDW: 16.4 % — ABNORMAL HIGH (ref 11.0–15.0)
Total Lymphocyte: 20.6 %
WBC: 3.6 10*3/uL — ABNORMAL LOW (ref 3.8–10.8)

## 2019-09-04 NOTE — Progress Notes (Signed)
Drop in hemoglobin was noted.  WBC is also decreased.  She is only on Plaquenil.  Please refer her to hematology for evaluation.

## 2019-09-17 ENCOUNTER — Other Ambulatory Visit: Payer: Self-pay | Admitting: *Deleted

## 2019-09-17 DIAGNOSIS — R899 Unspecified abnormal finding in specimens from other organs, systems and tissues: Secondary | ICD-10-CM

## 2019-09-18 ENCOUNTER — Encounter: Payer: Self-pay | Admitting: Orthopaedic Surgery

## 2019-09-18 ENCOUNTER — Ambulatory Visit: Payer: Managed Care, Other (non HMO) | Admitting: Orthopaedic Surgery

## 2019-09-18 VITALS — Ht 61.0 in | Wt 179.6 lb

## 2019-09-18 DIAGNOSIS — R6884 Jaw pain: Secondary | ICD-10-CM

## 2019-09-18 NOTE — Progress Notes (Signed)
Office Visit Note   Patient: [name]           Date of Birth: 24-Jun-1975           MRN: [id number] Visit Date: 09/18/2019              Requested by: Devra Dopp, MD [address] [place],  [place] [address] PCP: Devra Dopp, MD   Assessment & Plan: Visit Diagnoses:  1. Jaw pain     Plan: The patient is actually here for symptoms consistent with TMJ so I have suggested that she reach out to an ENT specialist.    Follow-Up Instructions: Return if symptoms worsen or fail to improve.   Orders:  No orders of the defined types were placed in this encounter.  No orders of the defined types were placed in this encounter.     Procedures: No procedures performed   Clinical Data: No additional findings.   Subjective: Chief Complaint  Patient presents with  . Jaw - Pain    HPI patient is a pleasant 44 year old female who comes in today after being referred by her dentist, Dr. Jason Fila in [facility].  She is here for right-sided jaw pain for which her dentist told her was an orthopedic issue.  She notes clicking and occasional sticking of the right side of her jaw.  No numbness, tingling or burning to either upper extremity.  No neck pain or discomfort.  Review of Systems as detailed in HPI.  All others reviewed and are negative.   Objective: Vital Signs: Ht 5\' 1"  (1.549 m)   Wt 179 lb 9.6 oz (81.5 kg)   BMI 33.94 kg/m   Physical Exam well-developed well-nourished female no acute distress.  Alert oriented x3.    Specialty Comments:  No specialty comments available.  Imaging: No new imaging   PMFS History: Patient Active Problem List   Diagnosis Date Noted  . Primary insomnia 04/04/2017  . 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 malar rash, history of oral ulcers and sicca symptoms 07/22/2016  . High risk medication use 07/22/2016  . History of hypothyroidism 07/22/2016  . History of gastroesophageal reflux (GERD)  07/22/2016  . Osteopenia of multiple sites 07/22/2016  . History of depression 07/22/2016   Past Medical History:  Diagnosis Date  . Abnormal Pap smear 1996  . Asthma   . Dyspareunia 2002  . Endometriosis 2004  . H/O amenorrhea 2009  . H/O candidiasis   . H/O cervicitis 2002  . H/O dysmenorrhea 2004  . H/O tinea cruris 2002  . H/O varicella   . H/O vulvovaginitis 2002  . H/O: menorrhagia 2003  . Hx: UTI (urinary tract infection)   . Hypothyroidism   . Libido, decreased 2002  . Menses, irregular 2004  . Ovarian cyst, left 2008  . PCB (post coital bleeding) 2003  . Pelvic pain in female 2007    Family History  Problem Relation Age of Onset  . Asthma Mother   . Diabetes Mother   . Depression Mother   . Asthma Father   . Heart disease Brother        Heart Murmur  . Arthritis Maternal Grandmother   . Cancer Maternal Grandmother        Breast & liver  . Depression Maternal Grandmother   . Heart disease Maternal Grandfather        MI  . Asthma Maternal Grandfather   . Diabetes Maternal Grandfather   .  Kidney disease Maternal Grandfather        Dialysis    Past Surgical History:  Procedure Laterality Date  . BREAST SURGERY    . GASTRIC    . NOVASURE ABLATION    . TUBAL LIGATION    . WISDOM TOOTH EXTRACTION  [id number]   Social History   Occupational History  . Not on file  Tobacco Use  . Smoking status: Never Smoker  . Smokeless tobacco: Never Used  Vaping Use  . Vaping Use: Never used  Substance and Sexual Activity  . Alcohol use: No  . Drug use: No  . Sexual activity: Yes    Birth control/protection: Surgical    Comment: BTL

## 2019-10-01 ENCOUNTER — Other Ambulatory Visit: Payer: Self-pay | Admitting: Physician Assistant

## 2019-10-01 DIAGNOSIS — M359 Systemic involvement of connective tissue, unspecified: Secondary | ICD-10-CM

## 2019-10-01 NOTE — Telephone Encounter (Signed)
Please clarify if the patient has had her consultation with hematology yet.    Ok to refill PLQ.

## 2019-10-01 NOTE — Telephone Encounter (Signed)
Patient sees hematology at [facility]. Notes are in Care Everywhere. Last seen 09/24/2019.

## 2019-10-01 NOTE — Telephone Encounter (Signed)
Last Visit: 05/28/2019 Next Visit: 10/29/2019 Labs:8/31/82021 Drop in hemoglobin was noted. WBC is also decreased. PLQ eye exam: 06/25/2019 WNL  Current Dose per office note on 05/29/2019:Plaquenil 200 mg 1 tablet twice daily Monday through Friday VC:[id number] disease   Okay to refill PLQ?

## 2019-10-16 NOTE — Progress Notes (Signed)
Office Visit Note  Patient: [name]             Date of Birth: 07/17/1975           MRN: [id number]             PCP: Helane Rima, MD Referring: Helane Rima, MD Visit Date: 10/29/2019 Occupation: @GUAROCC @  Subjective:  Fatigue   History of Present Illness: [name] is a 44 y.o. female with history of autoimmune disease and osteoarthritis.  She is taking plaquenil 200 mg 1 tablet by mouth twice daily M-F.  She is tolerating Plaquenil without any side effects.  She denies any signs or symptoms of a flare recently.  She states that she continues to have chronic fatigue but has noticed some improvement since having to iron infusions recently.  Patient reports that she is scheduled for a colonoscopy and endoscopy in December 2021 for further evaluation of chronic anemia.  She reports that she had a recent DEXA which revealed worsening bone density.  She received her first reclast infusion on 09/11/19.  She is taking a calcium and vitamin D supplement.  She experiences intermittent arthralgias but denies any joint swelling.  She denies any recent rashes but has ongoing photosensitivity and avoids direct sun exposure.  She denies any nasal or oral ulcerations.  She has not had any sicca symptoms recently.  She denies any enlarged lymph nodes.  She has not had any symptoms of Raynaud's and denies any digital ulcerations.     Activities of Daily Living:  Patient reports morning stiffness for 5 minutes.   Patient Denies nocturnal pain.  Difficulty dressing/grooming: Denies Difficulty climbing stairs: Reports Difficulty getting out of chair: Denies Difficulty using hands for taps, buttons, cutlery, and/or writing: Denies  Review of Systems  Constitutional: Positive for fatigue.  HENT: Negative for mouth sores, mouth dryness and nose dryness.   Eyes: Negative for pain, visual disturbance and dryness.  Respiratory: Negative for cough, hemoptysis, shortness of breath and  difficulty breathing.   Cardiovascular: Negative for chest pain, palpitations, hypertension and swelling in legs/feet.  Gastrointestinal: Negative for blood in stool, constipation and diarrhea.  Endocrine: Negative for increased urination.  Genitourinary: Negative for painful urination.  Musculoskeletal: Positive for arthralgias, joint pain, myalgias, morning stiffness and myalgias. Negative for joint swelling, muscle weakness and muscle tenderness.  Skin: Positive for hair loss. Negative for color change, pallor, rash, nodules/bumps, skin tightness, ulcers and sensitivity to sunlight.  Allergic/Immunologic: Negative for susceptible to infections.  Neurological: Positive for weakness. Negative for dizziness, numbness and headaches.  Hematological: Negative for swollen glands.  Psychiatric/Behavioral: Positive for depressed mood and sleep disturbance. The patient is nervous/anxious.     PMFS History:  Patient Active Problem List   Diagnosis Date Noted  . Primary insomnia 04/04/2017  . 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 malar rash, history of oral ulcers and sicca symptoms 07/22/2016  . High risk medication use 07/22/2016  . History of hypothyroidism 07/22/2016  . History of gastroesophageal reflux (GERD) 07/22/2016  . Osteopenia of multiple sites 07/22/2016  . History of depression 07/22/2016    Past Medical History:  Diagnosis Date  . Abnormal Pap smear 1996  . Asthma   . Dyspareunia 2002  . Endometriosis 2004  . H/O amenorrhea 2009  . H/O candidiasis   . H/O cervicitis 2002  . H/O dysmenorrhea 2004  . H/O tinea cruris 2002  . H/O varicella   . H/O vulvovaginitis  2002  . H/O: menorrhagia 2003  . Hx: UTI (urinary tract infection)   . Hypothyroidism   . Libido, decreased 2002  . Menses, irregular 2004  . Ovarian cyst, left 2008  . PCB (post coital bleeding) 2003  . Pelvic pain in female 2007    Family History  Problem Relation Age  of Onset  . Asthma Mother   . Diabetes Mother   . Depression Mother   . Asthma Father   . Heart disease Brother        Heart Murmur  . Arthritis Maternal Grandmother   . Cancer Maternal Grandmother        Breast & liver  . Depression Maternal Grandmother   . Heart disease Maternal Grandfather        MI  . Asthma Maternal Grandfather   . Diabetes Maternal Grandfather   . Kidney disease Maternal Grandfather        Dialysis   Past Surgical History:  Procedure Laterality Date  . BREAST SURGERY    . GASTRIC    . NOVASURE ABLATION    . TUBAL LIGATION    . WISDOM TOOTH EXTRACTION  [id number]   Social History   Social History Narrative  . Not on file   Immunization History  Administered Date(s) Administered  . Moderna SARS-COVID-2 Vaccination 09/17/2019, 10/15/2019     Objective: Vital Signs: BP 112/80 (BP Location: Left Arm, Patient Position: Sitting, Cuff Size: Small)   Pulse 81   Ht _0  (1.549 m)   Wt 179 lb 3.2 oz (81.3 kg)   BMI 33.86 kg/m    Physical Exam Vitals and nursing note reviewed.  Constitutional:      Appearance: She is well-developed.  HENT:     Head: Normocephalic and atraumatic.  Eyes:     Conjunctiva/sclera: Conjunctivae normal.  Pulmonary:     Effort: Pulmonary effort is normal.  Abdominal:     Palpations: Abdomen is soft.  Musculoskeletal:     Cervical back: Normal range of motion.  Skin:    General: Skin is warm and dry.     Capillary Refill: Capillary refill takes less than 2 seconds.  Neurological:     Mental Status: She is alert and oriented to person, place, and time.  Psychiatric:        Behavior: Behavior normal.      Musculoskeletal Exam: C-spine, thoracic spine, lumbar spine have good range of motion.  Shoulder joints, elbow joints, wrist joints, MCPs, PIPs, DIPs have good range of motion with no synovitis.  She is able to make a complete fist bilaterally.  Hip joints have good range of motion with no discomfort.  No  tenderness over trochanteric bursa bilaterally.  Knee joints have good range of motion with no warmth or effusion.  Ankle joints have good range of motion with no tenderness or inflammation.  No tenderness of MTP joints.  No tenderness along the achilles tendons or plantar fascia.   CDAI Exam: CDAI Score: -- Patient Global: --; Provider Global: -- Swollen: --; Tender: -- Joint Exam 10/29/2019   No joint exam has been documented for this visit   There is currently no information documented on the homunculus. Go to the Rheumatology activity and complete the homunculus joint exam.  Investigation: No additional findings.  Imaging: No results found.  Recent Labs: Lab Results  Component Value Date   WBC 3.6 (L) 09/03/2019   HGB 9.4 (L) 09/03/2019   PLT 267 09/03/2019   NA 141 09/03/2019  K 4.2 09/03/2019   CL 108 09/03/2019   CO2 24 09/03/2019   GLUCOSE 89 09/03/2019   BUN 18 09/03/2019   CREATININE 0.68 09/03/2019   BILITOT 0.2 09/03/2019   ALKPHOS 50 07/07/2017   AST 10 09/03/2019   ALT 6 09/03/2019   PROT 6.9 09/03/2019   ALBUMIN 3.9 07/07/2017   CALCIUM 9.0 09/03/2019   GFRAA 122 09/03/2019    Speciality Comments: PLQ eye exam: 06/25/2019 WNL  [facility] Follow up in 12 months.  Procedures:  No procedures performed Allergies: Sulfa drugs cross reactors       Assessment / Plan:     Visit Diagnoses: Autoimmune disease (HCC)positive ANA, positive Ro and positive La antibody. malar rash, history of oral ulcers and sicca symptoms: She has tried any signs or symptoms of a systemic autoimmune disease flare recently.  She is clinically doing well on Plaquenil 200 mg 1 tablet by mouth twice daily Monday through Friday.  She continues to tolerate Plaquenil without any side effects.  She has not had any recent rashes, oral or nasal ulcerations, sicca symptoms, enlarged lymph nodes, or symptoms of Raynaud's.  She experiences photosensitivity and tries to avoid direct sun  exposure and was encouraged to wear sunscreen SPF greater than 50 on a daily basis.  She continues to have chronic fatigue which she attributes to iron deficiency anemia.  She has had 2 iron infusions recently and has noticed some improvement in her energy level.  She experiences occasional arthralgias but does not have any joint tenderness or synovitis on examination today.  Lab work from 2-21 was reviewed today in the office: ESR 25, complements within normal limits, and double-stranded DNA was negative.  She is due to update the following lab work today.  She will continue taking Plaquenil 200 mg 1 tablet by mouth twice daily Monday through Friday.  She does not need any refills at this time.  She was advised to notify us if she develops signs or symptoms of a flare.  She will follow-up in the office in 5 months.- Plan: Anti-DNA antibody, double-stranded, C3 and C4, Sedimentation rate  High risk medication use - Plaquenil 200 mg 1 tablet twice daily Monday through Friday. PLQ eye exam: 06/25/2019. CMP reviwed in care everywhere from 08/1919.  CBC and iron panel reviewed in care everywhere. She has not had any recent infections.  She has received both moderna vaccinations.  She was advised to notify us or her PCP if she develops the covid-19 infection in order to receive the monoclonal antibody infusion.  She voiced understanding.   Primary osteoarthritis of both knees - Updated x-rays were obtained which did not reveal much radiographic progression on 05/28/19.  She has moderate OA and moderate chondromalacia patella bilaterally.  She has good ROM of both knee joints on exam today.  No warmth or effusion noted.  She has bilateral knee crepitus.   Plantar fasciitis of right foot - Resolved.  Osteopenia of multiple sites - DEXA updated on 08/07/2019: left femoral neck BMD 0.735 with T-score -2.2.  History of gastric gastrectomy.   Patient received her first Reclast IV infusion on 09/11/2019.  She experienced  myalgias and arthralgias the day after receiving the infusion.  She continues to take a calcium and vitamin D supplement on a daily basis as recommended.  She has not had any recent falls or fractures.  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 Iron was 20 and ferritin was 9 on  09/24/19.  She has been followed by hematology. She has received injectafer x2 (10/01/19 and 10/08/19).  Her energy level has gradually been improving.  She will be undergoing a colonoscopy and endoscopy in December 2021 for further workup.   Other medical conditions are listed as follows:   Primary insomnia - She takes Ambien 10 mg PRN at bedtime for insomnia.  History of depression  History of hypothyroidism  History of gastroesophageal reflux (GERD): Endoscopy and colonscopy scheduled in December 2021.   Orders: Orders Placed This Encounter  Procedures  . Anti-DNA antibody, double-stranded  . C3 and C4  . Sedimentation rate   No orders of the defined types were placed in this encounter.   Follow-Up Instructions: Return in about 5 months (around 03/28/2020) for Autoimmune Disease, Osteoarthritis.   Ofilia Neas, PA-C  Note - This record has been created using Dragon software.  Chart creation errors have been sought, but may not always  have been located. Such creation errors do not reflect on  the standard of medical care.

## 2019-10-29 ENCOUNTER — Encounter: Payer: Self-pay | Admitting: Physician Assistant

## 2019-10-29 ENCOUNTER — Other Ambulatory Visit: Payer: Self-pay

## 2019-10-29 ENCOUNTER — Ambulatory Visit (INDEPENDENT_AMBULATORY_CARE_PROVIDER_SITE_OTHER): Payer: Managed Care, Other (non HMO) | Admitting: Physician Assistant

## 2019-10-29 VITALS — BP 112/80 | HR 81 | Ht 61.0 in | Wt 179.2 lb

## 2019-10-29 DIAGNOSIS — Z8719 Personal history of other diseases of the digestive system: Secondary | ICD-10-CM

## 2019-10-29 DIAGNOSIS — Z8659 Personal history of other mental and behavioral disorders: Secondary | ICD-10-CM

## 2019-10-29 DIAGNOSIS — K9589 Other complications of other bariatric procedure: Secondary | ICD-10-CM

## 2019-10-29 DIAGNOSIS — Z79899 Other long term (current) drug therapy: Secondary | ICD-10-CM | POA: Diagnosis not present

## 2019-10-29 DIAGNOSIS — M359 Systemic involvement of connective tissue, unspecified: Secondary | ICD-10-CM | POA: Diagnosis not present

## 2019-10-29 DIAGNOSIS — M722 Plantar fascial fibromatosis: Secondary | ICD-10-CM

## 2019-10-29 DIAGNOSIS — M170 Bilateral primary osteoarthritis of knee: Secondary | ICD-10-CM | POA: Diagnosis not present

## 2019-10-29 DIAGNOSIS — M8589 Other specified disorders of bone density and structure, multiple sites: Secondary | ICD-10-CM

## 2019-10-29 DIAGNOSIS — F5101 Primary insomnia: Secondary | ICD-10-CM

## 2019-10-29 DIAGNOSIS — Z8639 Personal history of other endocrine, nutritional and metabolic disease: Secondary | ICD-10-CM

## 2019-10-29 DIAGNOSIS — D508 Other iron deficiency anemias: Secondary | ICD-10-CM

## 2019-10-29 NOTE — Patient Instructions (Signed)
Standing Labs We placed an order today for your standing lab work.   Please have your standing labs drawn in 5 months  If possible, please have your labs drawn 2 weeks prior to your appointment so that the provider can discuss your results at your appointment.  We have open lab daily Monday through Thursday from 8:30-12:30 PM and 1:30-4:30 PM and Friday from 8:30-12:30 PM and 1:30-4:00 PM at the office of Dr. Pollyann Savoy, [facility].   Please be advised, patients with office appointments requiring lab work will take precedents over walk-in lab work.  If possible, please come for your lab work on Monday and Friday afternoons, as you may experience shorter wait times. The office is located at [address], [address], [place], [place] [address] No appointment is necessary.   Labs are drawn by [facility]. Please bring your co-pay at the time of your lab draw.  You may receive a bill from [facility] for your lab work.  If you wish to have your labs drawn at another location, please call the office 24 hours in advance to send orders.  If you have any questions regarding directions or hours of operation,  please call [phone].   As a reminder, please drink plenty of water prior to coming for your lab work. Thanks!  COVID-19 vaccine recommendations:   COVID-19 vaccine is recommended for everyone (unless you are allergic to a vaccine component), even if you are on a medication that suppresses your immune system.   If you are on Methotrexate, Cellcept (mycophenolate), Rinvoq, Harriette Ohara, and Olumiant- hold the medication for 1 week after each vaccine. Hold Methotrexate for 2 weeks after the single dose COVID-19 vaccine.   If you are on Orencia subcutaneous injection - hold medication one week prior to and one week after the first COVID-19 vaccine dose (only).   If you are on Orencia IV infusions- time vaccination administration so that the first COVID-19 vaccination will occur four  weeks after the infusion and postpone the subsequent infusion by one week.   If you are on Cyclophosphamide or Rituxan infusions please contact your doctor prior to receiving the COVID-19 vaccine.   Do not take Tylenol or any anti-inflammatory medications (NSAIDs) 24 hours prior to the COVID-19 vaccination.   There is no direct evidence about the efficacy of the COVID-19 vaccine in individuals who are on medications that suppress the immune system.   Even if you are fully vaccinated, and you are on any medications that suppress your immune system, please continue to wear a mask, maintain at least six feet social distance and practice hand hygiene.   If you develop a COVID-19 infection, please contact your PCP or our office to determine if you need antibody infusion.  The booster vaccine is now available for immunocompromised patients. It is advised that if you had Pfizer vaccine you should get [employer] booster.  If you had a Moderna vaccine then you should get a Moderna booster. Johnson and [employer]ural [employer] does not have a booster vaccine at this time.  Please see the following web sites for updated information.   https://[email or web address]/[email or web address]/0/Files/[email or web address]-19-Vaccination-Patient-Resources.[email or web address]  https://[email or web address].org/About-Us/Newsroom/Press-Releases/ID/1159

## 2019-10-30 LAB — C3 AND C4
C3 Complement: 112 mg/dL (ref 83–193)
C4 Complement: 24 mg/dL (ref 15–57)

## 2019-10-30 LAB — SEDIMENTATION RATE: Sed Rate: 9 mm/h (ref 0–20)

## 2019-10-30 LAB — ANTI-DNA ANTIBODY, DOUBLE-STRANDED: ds DNA Ab: 1 IU/mL

## 2019-10-30 NOTE — Progress Notes (Signed)
ESR and complements WNL.  DsDNA is negative.

## 2019-12-04 HISTORY — PX: COLONOSCOPY: SHX174

## 2019-12-04 HISTORY — PX: UPPER GI ENDOSCOPY: SHX6162

## 2019-12-31 ENCOUNTER — Other Ambulatory Visit: Payer: Self-pay

## 2019-12-31 DIAGNOSIS — M359 Systemic involvement of connective tissue, unspecified: Secondary | ICD-10-CM

## 2020-01-01 MED ORDER — HYDROXYCHLOROQUINE SULFATE 200 MG PO TABS: ORAL_TABLET | ORAL | 0 refills | Status: DC

## 2020-01-01 NOTE — Telephone Encounter (Signed)
Last Visit: 10/29/2019  Next Visit: 03/26/2020  Labs: 09/03/2019 Drop in hemoglobin was noted. WBC is also decreased Eye exam:  06/25/2019 WNL   Current Dose per office note 10/29/2019: Plaquenil 200 mg 1 tablet by mouth twice daily Monday through Friday. DX: Autoimmune disease (HCC)positive ANA, positive Ro and positive La antibody. malar rash, history of oral ulcers and sicca symptoms  Okay to refill Plaquenil?

## 2020-02-27 ENCOUNTER — Telehealth: Payer: Self-pay | Admitting: *Deleted

## 2020-02-27 NOTE — Telephone Encounter (Signed)
Health and Safety Notification  [facility] works with your patients' plan sponsors to provide you with the enclosed [employer] safety and health considerations for patients in your practice. Please review the health information provided and make any changes in therapy that you believe are appropriate. [facility] understands that the information may not be applicable to every patient's therapy and therefore presents it as informational only.  Adverse Drug Interaction: HYDROXYCHLOROQUINE SULFATE and CITALOPRAM HBR Your patient is receiving HYDROXYCHLOROQUINE SULFATE and CITALOPRAM HBR based on claims records. Hydroxychloroquine should not be administered with other drugs that have the potential to prolong the QT interval. Patients may be at increased risk of ventricular arrhythmias, including torsades de pointes.     Gearldine Bienenstock, PA-C  Henriette Combs, LPN Please make the patient aware of the risk for QT prolongation while on PLQ and celexa. Please clarify if she has had a baseline EKG since starting on PLQ.   Attempted to contact the patient and left message for patient to call the office.

## 2020-02-28 NOTE — Telephone Encounter (Signed)
Patient states she had a baseline EKG this past year which was normal.

## 2020-03-13 NOTE — Progress Notes (Deleted)
Office Visit Note  Patient: [name]             Date of Birth: August 02, 1975           MRN: [id number]             PCP: Devra Dopp, MD Referring: Devra Dopp, MD Visit Date: 03/26/2020 Occupation: @GUAROCC @  Subjective:  No chief complaint on file.   History of Present Illness: [name] is a 45 y.o. female ***   Activities of Daily Living:  Patient reports morning stiffness for *** {minute/hour:19697}.   Patient {ACTIONS;DENIES/REPORTS:21021675::"Denies"} nocturnal pain.  Difficulty dressing/grooming: {ACTIONS;DENIES/REPORTS:21021675::"Denies"} Difficulty climbing stairs: {ACTIONS;DENIES/REPORTS:21021675::"Denies"} Difficulty getting out of chair: {ACTIONS;DENIES/REPORTS:21021675::"Denies"} Difficulty using hands for taps, buttons, cutlery, and/or writing: {ACTIONS;DENIES/REPORTS:21021675::"Denies"}  No Rheumatology ROS completed.   PMFS History:  Patient Active Problem List   Diagnosis Date Noted  . Primary insomnia 04/04/2017  . 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 malar rash, history of oral ulcers and sicca symptoms 07/22/2016  . High risk medication use 07/22/2016  . History of hypothyroidism 07/22/2016  . History of gastroesophageal reflux (GERD) 07/22/2016  . Osteopenia of multiple sites 07/22/2016  . History of depression 07/22/2016    Past Medical History:  Diagnosis Date  . Abnormal Pap smear 1996  . Asthma   . Dyspareunia 2002  . Endometriosis 2004  . H/O amenorrhea 2009  . H/O candidiasis   . H/O cervicitis 2002  . H/O dysmenorrhea 2004  . H/O tinea cruris 2002  . H/O varicella   . H/O vulvovaginitis 2002  . H/O: menorrhagia 2003  . Hx: UTI (urinary tract infection)   . Hypothyroidism   . Libido, decreased 2002  . Menses, irregular 2004  . Ovarian cyst, left 2008  . PCB (post coital bleeding) 2003  . Pelvic pain in female 2007    Family History  Problem Relation Age of Onset  .  Asthma Mother   . Diabetes Mother   . Depression Mother   . Asthma Father   . Heart disease Brother        Heart Murmur  . Arthritis Maternal Grandmother   . Cancer Maternal Grandmother        Breast & liver  . Depression Maternal Grandmother   . Heart disease Maternal Grandfather        MI  . Asthma Maternal Grandfather   . Diabetes Maternal Grandfather   . Kidney disease Maternal Grandfather        Dialysis   Past Surgical History:  Procedure Laterality Date  . BREAST SURGERY    . GASTRIC    . NOVASURE ABLATION    . TUBAL LIGATION    . WISDOM TOOTH EXTRACTION  2008   Social History   Social History Narrative  . Not on file   Immunization History  Administered Date(s) Administered  . Moderna Sars-Covid-2 Vaccination 09/17/2019, 10/15/2019     Objective: Vital Signs: There were no vitals taken for this visit.   Physical Exam   Musculoskeletal Exam: ***  CDAI Exam: CDAI Score: -- Patient Global: --; Provider Global: -- Swollen: --; Tender: -- Joint Exam 03/26/2020   No joint exam has been documented for this visit   There is currently no information documented on the homunculus. Go to the Rheumatology activity and complete the homunculus joint exam.  Investigation: No additional findings.  Imaging: No results found.  Recent Labs: Lab Results  Component Value Date   WBC 3.6 (L)  09/03/2019   HGB 9.4 (L) 09/03/2019   PLT 267 09/03/2019   NA 141 09/03/2019   K 4.2 09/03/2019   CL 108 09/03/2019   CO2 24 09/03/2019   GLUCOSE 89 09/03/2019   BUN 18 09/03/2019   CREATININE 0.68 09/03/2019   BILITOT 0.2 09/03/2019   ALKPHOS 50 07/07/2017   AST 10 09/03/2019   ALT 6 09/03/2019   PROT 6.9 09/03/2019   ALBUMIN 3.9 07/07/2017   CALCIUM 9.0 09/03/2019   GFRAA 122 09/03/2019    Speciality Comments: PLQ eye exam: 06/25/2019 WNL  [facility] Follow up in 12 months.  Procedures:  No procedures performed Allergies: Sulfa drugs cross reactors    Assessment / Plan:     Visit Diagnoses: No diagnosis found.  Orders: No orders of the defined types were placed in this encounter.  No orders of the defined types were placed in this encounter.   Face-to-face time spent with patient was *** minutes. Greater than 50% of time was spent in counseling and coordination of care.  Follow-Up Instructions: No follow-ups on file.   Ellen Henri, CMA  Note - This record has been created using Animal nutritionist.  Chart creation errors have been sought, but may not always  have been located. Such creation errors do not reflect on  the standard of medical care.

## 2020-03-26 ENCOUNTER — Ambulatory Visit: Payer: Managed Care, Other (non HMO) | Admitting: Rheumatology

## 2020-03-26 DIAGNOSIS — Z8639 Personal history of other endocrine, nutritional and metabolic disease: Secondary | ICD-10-CM

## 2020-03-26 DIAGNOSIS — M8589 Other specified disorders of bone density and structure, multiple sites: Secondary | ICD-10-CM

## 2020-03-26 DIAGNOSIS — K9589 Other complications of other bariatric procedure: Secondary | ICD-10-CM

## 2020-03-26 DIAGNOSIS — Z8659 Personal history of other mental and behavioral disorders: Secondary | ICD-10-CM

## 2020-03-26 DIAGNOSIS — M722 Plantar fascial fibromatosis: Secondary | ICD-10-CM

## 2020-03-26 DIAGNOSIS — M359 Systemic involvement of connective tissue, unspecified: Secondary | ICD-10-CM

## 2020-03-26 DIAGNOSIS — F5101 Primary insomnia: Secondary | ICD-10-CM

## 2020-03-26 DIAGNOSIS — M170 Bilateral primary osteoarthritis of knee: Secondary | ICD-10-CM

## 2020-03-26 DIAGNOSIS — Z8719 Personal history of other diseases of the digestive system: Secondary | ICD-10-CM

## 2020-03-26 DIAGNOSIS — Z79899 Other long term (current) drug therapy: Secondary | ICD-10-CM

## 2020-03-27 NOTE — Progress Notes (Signed)
Office Visit Note  Patient: [name]             Date of Birth: Jan 07, 1975           MRN: [id number]             PCP: Helane Rima, MD Referring: Helane Rima, MD Visit Date: 03/31/2020 Occupation: @GUAROCC @  Subjective:  Medication monitoring    History of Present Illness: [name] is a 45 y.o. female with history of autoimmune disease, osteoarthritis, and osteopenia.  She is taking plaquenil 200 mg 1 tablet by mouth twice daily Monday through Friday.  She continues to tolerate PLQ without any side effects.  She denies any signs or symptoms of a flare.  She has not had any joint pain or joint swelling.  She denies any morning stiffness.  Patient reports that her energy level has been stable overall.  She states and since her last office visit she has had 2 iron infusions which initially improved her energy significantly.  She reports that she has been sleeping about 6 hours per night and denies any nocturnal pain.  She continues to have intermittent rashes on her chest about once a month and typically uses fragrance free lotion topically and the rashes are self resolving.  She notices occasional facial flushing but denies any other rashes currently.  She has not had any symptoms of Raynaud's.  She denies any oral or nasal ulcerations.  She denies any sicca symptoms.  She has not noticed any swollen lymph nodes.  She denies any infections.  She has not had any shortness of breath, pleuritic chest pain, or palpitations. She denies any falls or fractures.  She will be due for her next Reclast infusion in September 2022.  She has not been as compliant taking calcium and vitamin D supplements on a daily basis as recommended.     Activities of Daily Living:  Patient reports morning stiffness for 5 minutes.   Patient Denies nocturnal pain.  Difficulty dressing/grooming: Denies Difficulty climbing stairs: Reports Difficulty getting out of chair: Denies Difficulty using hands  for taps, buttons, cutlery, and/or writing: Denies  Review of Systems  Constitutional: Positive for fatigue.  HENT: Negative for mouth sores, mouth dryness and nose dryness.   Eyes: Negative for pain, itching and dryness.  Respiratory: Negative for shortness of breath and difficulty breathing.   Cardiovascular: Negative for chest pain and palpitations.  Gastrointestinal: Negative for blood in stool, constipation and diarrhea.  Endocrine: Negative for increased urination.  Genitourinary: Negative for difficulty urinating.  Musculoskeletal: Positive for morning stiffness. Negative for arthralgias, joint pain, joint swelling, myalgias, muscle tenderness and myalgias.  Skin: Negative for color change, rash and redness.  Allergic/Immunologic: Negative for susceptible to infections.  Neurological: Positive for headaches. Negative for dizziness, numbness, memory loss and weakness.  Hematological: Positive for bruising/bleeding tendency.  Psychiatric/Behavioral: Negative for confusion.    PMFS History:  Patient Active Problem List   Diagnosis Date Noted  . Primary insomnia 04/04/2017  . 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 malar rash, history of oral ulcers and sicca symptoms 07/22/2016  . High risk medication use 07/22/2016  . History of hypothyroidism 07/22/2016  . History of gastroesophageal reflux (GERD) 07/22/2016  . Osteopenia of multiple sites 07/22/2016  . History of depression 07/22/2016    Past Medical History:  Diagnosis Date  . Abnormal Pap smear 1996  . Asthma   . Dyspareunia 2002  . Endometriosis 2004  .  H/O amenorrhea 2009  . H/O candidiasis   . H/O cervicitis 2002  . H/O dysmenorrhea 2004  . H/O tinea cruris 2002  . H/O varicella   . H/O vulvovaginitis 2002  . H/O: menorrhagia 2003  . Hx: UTI (urinary tract infection)   . Hypothyroidism   . Libido, decreased 2002  . Menses, irregular 2004  . Ovarian cyst, left 2008  . PCB  (post coital bleeding) 2003  . Pelvic pain in female 2007    Family History  Problem Relation Age of Onset  . Asthma Mother   . Diabetes Mother   . Depression Mother   . Asthma Father   . Heart disease Brother        Heart Murmur  . Arthritis Maternal Grandmother   . Cancer Maternal Grandmother        Breast & liver  . Depression Maternal Grandmother   . Heart disease Maternal Grandfather        MI  . Asthma Maternal Grandfather   . Diabetes Maternal Grandfather   . Kidney disease Maternal Grandfather        Dialysis   Past Surgical History:  Procedure Laterality Date  . BREAST SURGERY    . COLONOSCOPY  12/2019  . GASTRIC    . NOVASURE ABLATION    . TUBAL LIGATION    . UPPER GI ENDOSCOPY  12/2019  . WISDOM TOOTH EXTRACTION  [id number]   Social History   Social History Narrative  . Not on file   Immunization History  Administered Date(s) Administered  . Moderna Sars-Covid-2 Vaccination 09/17/2019, 10/15/2019     Objective: Vital Signs: BP 107/76 (BP Location: Left Arm, Patient Position: Sitting, Cuff Size: Normal)   Pulse 76   Resp 13   Ht 5' 1"  (1.549 m)   Wt 184 lb 6.4 oz (83.6 kg)   BMI 34.84 kg/m    Physical Exam Vitals and nursing note reviewed.  Constitutional:      Appearance: She is well-developed.  HENT:     Head: Normocephalic and atraumatic.  Eyes:     Conjunctiva/sclera: Conjunctivae normal.  Pulmonary:     Effort: Pulmonary effort is normal.  Abdominal:     Palpations: Abdomen is soft.  Musculoskeletal:     Cervical back: Normal range of motion.  Skin:    General: Skin is warm and dry.     Capillary Refill: Capillary refill takes less than 2 seconds.  Neurological:     Mental Status: She is alert and oriented to person, place, and time.  Psychiatric:        Behavior: Behavior normal.      Musculoskeletal Exam: C-spine, thoracic spine, and lumbar spine good ROM.  No midline spinal tenderness or SI joint tenderness.  Shoulder joints,  elbow joints, wrist joints, MCPs, PIPs, and DIPs good ROM with no synovitis.  Complete fist formation bilaterally.  Hip joints, knee joints, and ankle joints good ROM with no discomfort.  No warmth or effusion of knee joints.  No tenderness or swelling of ankle joints.   CDAI Exam: CDAI Score: -- Patient Global: --; Provider Global: -- Swollen: --; Tender: -- Joint Exam 03/31/2020   No joint exam has been documented for this visit   There is currently no information documented on the homunculus. Go to the Rheumatology activity and complete the homunculus joint exam.  Investigation: No additional findings.  Imaging: No results found.  Recent Labs: Lab Results  Component Value Date   WBC 3.6 (  L) 09/03/2019   HGB 9.4 (L) 09/03/2019   PLT 267 09/03/2019   NA 141 09/03/2019   K 4.2 09/03/2019   CL 108 09/03/2019   CO2 24 09/03/2019   GLUCOSE 89 09/03/2019   BUN 18 09/03/2019   CREATININE 0.68 09/03/2019   BILITOT 0.2 09/03/2019   ALKPHOS 50 07/07/2017   AST 10 09/03/2019   ALT 6 09/03/2019   PROT 6.9 09/03/2019   ALBUMIN 3.9 07/07/2017   CALCIUM 9.0 09/03/2019   GFRAA 122 09/03/2019    Speciality Comments: PLQ eye exam: 06/25/2019 WNL  [facility] Follow up in 12 months.  Procedures:  No procedures performed Allergies: Sulfa drugs cross reactors    Assessment / Plan:     Visit Diagnoses: Autoimmune disease (HCC)positive ANA, positive Ro and positive La antibody. malar rash, history of oral ulcers and sicca symptoms -She has not had any signs or symptoms of a systemic autoimmune disease flare recently.  She is clinically doing well taking Plaquenil 200 mg 1 tablet by mouth twice daily Monday through Friday.  She continues to tolerate Plaquenil without any side effects.  She is not experiencing any joint pain, morning stiffness, or nocturnal pain at this time.  She has no synovitis on examination today.  She continues to experience intermittent rashes on her chest which  are typically self resolving.  She has not noticed any increased photosensitivity.  We discussed the importance of wearing sunscreen SPF greater than 50 on a daily basis and reapplying every 2 hours.  We discussed the importance of avoiding direct sun exposure due to underlying autoimmune disease as well as increased photosensitivity while taking Plaquenil.  She has not had any symptoms of Raynaud's and no signs of sclerodactyly were noted on exam today.  She has not had any oral or nasal ulcerations or sicca symptoms recently.  She denies any shortness of breath, pleuritic chest pain, or palpitations. Lab work from 10/29/19 was reviewed today in the office: dsDNA negative, ESR WNL, and complements WNL.  She is due to update lab work.  Orders were released.  She will continue on Plaquenil as prescribed.  Refill of Plaquenil sent to the pharmacy today.  She was advised to notify us if she develops any new or worsening symptoms.  She will follow-up in the office in 5 months.  Plan: COMPLETE METABOLIC PANEL WITH GFR, CBC with Differential/Platelet, Urinalysis, Routine w reflex microscopic, C3 and C4, Anti-DNA antibody, double-stranded, Sedimentation rate, ANA, hydroxychloroquine (PLAQUENIL) 200 MG tablet  High risk medication use - Plaquenil 200 mg 1 tablet twice daily Monday through Friday.  CBC and CMP drawn on 09/03/19.  She is overdue to update CBC and CMP.  Orders were released. PLQ eye exam: 06/25/2019 WNL [place] Follow up in 12 months.  She was given a Plaquenil eye exam form to take with her to her upcoming appointment.- Plan: COMPLETE METABOLIC PANEL WITH GFR, CBC with Differential/Platelet She has not had any recent infections.  Primary osteoarthritis of both knees: She has good range of motion of both knee joints on exam.  No warmth or effusion was noted.  We discussed the importance of lower extremity muscle strengthening and regular exercise.  Plantar fasciitis of right foot: Resolved.    Osteopenia of multiple sites - DEXA updated on 08/07/2019: LFN BMD 0.735 with T-score -2.2.  History of gastric gastrectomy.  She has not had any falls or fractures recently.  First Reclast IV infusion on 09/11/19.  She was  encouraged to restart taking a calcium and vitamin D supplement on a daily basis.  Iron deficiency anemia following bariatric surgery: According to the patient she has established care with a hematologist and has had 2 iron infusions since her last office visit.  She notices significant improvement in her energy level after the first 2 infusions.  We will check CBC today and forward results to her hematologist.  Primary insomnia: She has been sleeping about 6 hours per night.  No nocturnal pain.  She takes ambien 10 mg at bedtime as needed for insomnia.   Other medical conditions are listed as follows:   History of depression  History of hypothyroidism  History of gastroesophageal reflux (GERD)  Orders: Orders Placed This Encounter  Procedures  . COMPLETE METABOLIC PANEL WITH GFR  . CBC with Differential/Platelet  . Urinalysis, Routine w reflex microscopic  . C3 and C4  . Anti-DNA antibody, double-stranded  . Sedimentation rate  . ANA   Meds ordered this encounter  Medications  . hydroxychloroquine (PLAQUENIL) 200 MG tablet    Sig: Take one tablet by mouth twice daily Monday-Friday only.    Dispense:  120 tablet    Refill:  0     Follow-Up Instructions: Return in about 5 months (around 08/31/2020) for Autoimmune Disease, Osteoarthritis.   Ofilia Neas, PA-C  Note - This record has been created using Dragon software.  Chart creation errors have been sought, but may not always  have been located. Such creation errors do not reflect on  the standard of medical care.

## 2020-03-31 ENCOUNTER — Other Ambulatory Visit: Payer: Self-pay

## 2020-03-31 ENCOUNTER — Encounter: Payer: Self-pay | Admitting: Physician Assistant

## 2020-03-31 ENCOUNTER — Ambulatory Visit: Payer: Managed Care, Other (non HMO) | Admitting: Physician Assistant

## 2020-03-31 VITALS — BP 107/76 | HR 76 | Resp 13 | Ht 61.0 in | Wt 184.4 lb

## 2020-03-31 DIAGNOSIS — M359 Systemic involvement of connective tissue, unspecified: Secondary | ICD-10-CM | POA: Diagnosis not present

## 2020-03-31 DIAGNOSIS — Z79899 Other long term (current) drug therapy: Secondary | ICD-10-CM | POA: Diagnosis not present

## 2020-03-31 DIAGNOSIS — M170 Bilateral primary osteoarthritis of knee: Secondary | ICD-10-CM

## 2020-03-31 DIAGNOSIS — Z8639 Personal history of other endocrine, nutritional and metabolic disease: Secondary | ICD-10-CM

## 2020-03-31 DIAGNOSIS — M722 Plantar fascial fibromatosis: Secondary | ICD-10-CM

## 2020-03-31 DIAGNOSIS — Z8719 Personal history of other diseases of the digestive system: Secondary | ICD-10-CM

## 2020-03-31 DIAGNOSIS — M8589 Other specified disorders of bone density and structure, multiple sites: Secondary | ICD-10-CM

## 2020-03-31 DIAGNOSIS — D508 Other iron deficiency anemias: Secondary | ICD-10-CM

## 2020-03-31 DIAGNOSIS — K9589 Other complications of other bariatric procedure: Secondary | ICD-10-CM

## 2020-03-31 DIAGNOSIS — F5101 Primary insomnia: Secondary | ICD-10-CM

## 2020-03-31 DIAGNOSIS — Z8659 Personal history of other mental and behavioral disorders: Secondary | ICD-10-CM

## 2020-03-31 MED ORDER — HYDROXYCHLOROQUINE SULFATE 200 MG PO TABS
ORAL_TABLET | ORAL | 0 refills | Status: DC
Start: 2020-03-31 — End: 2020-07-21

## 2020-04-01 LAB — CBC WITH DIFFERENTIAL/PLATELET
Absolute Monocytes: 442 cells/uL (ref 200–950)
Basophils Absolute: 52 cells/uL (ref 0–200)
Basophils Relative: 1 %
Eosinophils Absolute: 203 cells/uL (ref 15–500)
Eosinophils Relative: 3.9 %
HCT: 41.1 % (ref 35.0–45.0)
Hemoglobin: 13.3 g/dL (ref 11.7–15.5)
Lymphs Abs: 1076 cells/uL (ref 850–3900)
MCH: 29.1 pg (ref 27.0–33.0)
MCHC: 32.4 g/dL (ref 32.0–36.0)
MCV: 89.9 fL (ref 80.0–100.0)
MPV: 9.8 fL (ref 7.5–12.5)
Monocytes Relative: 8.5 %
Neutro Abs: 3427 cells/uL (ref 1500–7800)
Neutrophils Relative %: 65.9 %
Platelets: 291 10*3/uL (ref 140–400)
RBC: 4.57 10*6/uL (ref 3.80–5.10)
RDW: 11.9 % (ref 11.0–15.0)
Total Lymphocyte: 20.7 %
WBC: 5.2 10*3/uL (ref 3.8–10.8)

## 2020-04-01 LAB — ANTI-DNA ANTIBODY, DOUBLE-STRANDED: ds DNA Ab: 1 IU/mL

## 2020-04-01 LAB — COMPLETE METABOLIC PANEL WITH GFR
AG Ratio: 1.6 (calc) (ref 1.0–2.5)
ALT: 11 U/L (ref 6–29)
AST: 11 U/L (ref 10–35)
Albumin: 4.4 g/dL (ref 3.6–5.1)
Alkaline phosphatase (APISO): 45 U/L (ref 31–125)
BUN: 17 mg/dL (ref 7–25)
CO2: 28 mmol/L (ref 20–32)
Calcium: 9.3 mg/dL (ref 8.6–10.2)
Chloride: 106 mmol/L (ref 98–110)
Creat: 0.71 mg/dL (ref 0.50–1.10)
GFR, Est African American: 118 mL/min/{1.73_m2} (ref >=60)
GFR, Est Non African American: 102 mL/min/{1.73_m2} (ref >=60)
Globulin: 2.8 g/dL (calc) (ref 1.9–3.7)
Glucose, Bld: 90 mg/dL (ref 65–99)
Potassium: 4.8 mmol/L (ref 3.5–5.3)
Sodium: 140 mmol/L (ref 135–146)
Total Bilirubin: 0.3 mg/dL (ref 0.2–1.2)
Total Protein: 7.2 g/dL (ref 6.1–8.1)

## 2020-04-01 LAB — URINALYSIS, ROUTINE W REFLEX MICROSCOPIC
Bilirubin Urine: NEGATIVE
Glucose, UA: NEGATIVE
Hgb urine dipstick: NEGATIVE
Ketones, ur: NEGATIVE
Nitrite: NEGATIVE
Protein, ur: NEGATIVE
Specific Gravity, Urine: 1.016 (ref 1.001–1.03)
pH: 8 (ref 5.0–8.0)

## 2020-04-01 LAB — ANA: Anti Nuclear Antibody (ANA): NEGATIVE

## 2020-04-01 LAB — C3 AND C4
C3 Complement: 120 mg/dL (ref 83–193)
C4 Complement: 28 mg/dL (ref 15–57)

## 2020-04-01 LAB — SEDIMENTATION RATE: Sed Rate: 9 mm/h (ref 0–20)

## 2020-04-01 LAB — MICROSCOPIC MESSAGE

## 2020-04-01 NOTE — Progress Notes (Signed)
CBC and CMP WNL.  ESR WNL.  Complements WNL.  UA revealed 2+ leukocytes.  Negative for bacteria and nitrites.  If she develops symptoms of a UTI she should reach out to PCP for further evaluation.   ANA and dsDNA are pending.

## 2020-04-02 NOTE — Progress Notes (Signed)
ANA negative and dsDNA is negative.   Labs are not consistent with a flare.  Continue on current dose of PLQ.  No further recommendations at this time.

## 2020-06-15 IMAGING — DX DG KNEE COMPLETE 4+V*L*
4 series · 4 of 4 positions shown · non-contrast
Comparison: None.

CLINICAL DATA: Fall, pain

EXAM:
LEFT KNEE - COMPLETE 4+ VIEW

[knee ap]
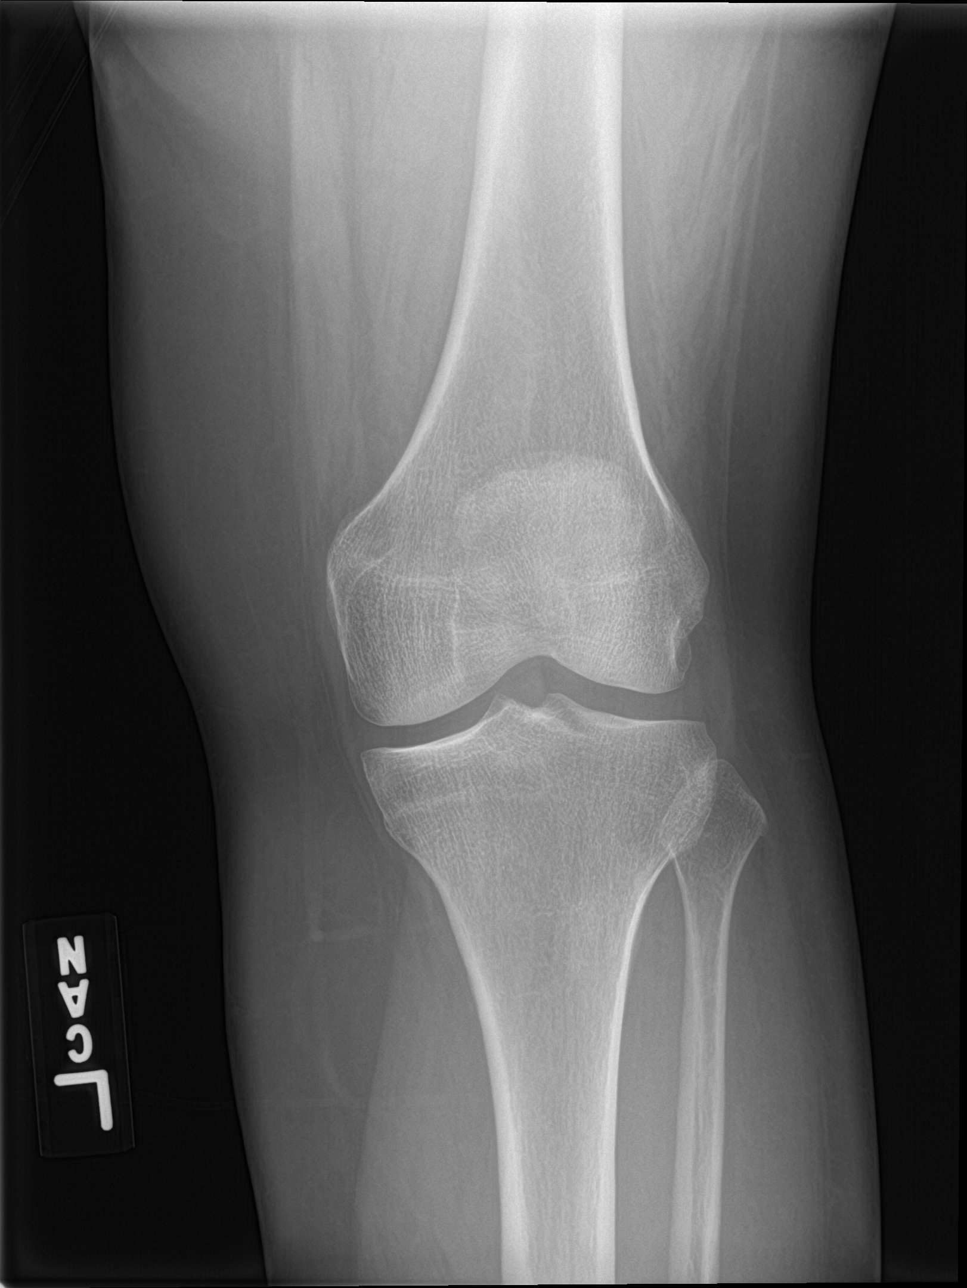

[tunnel]
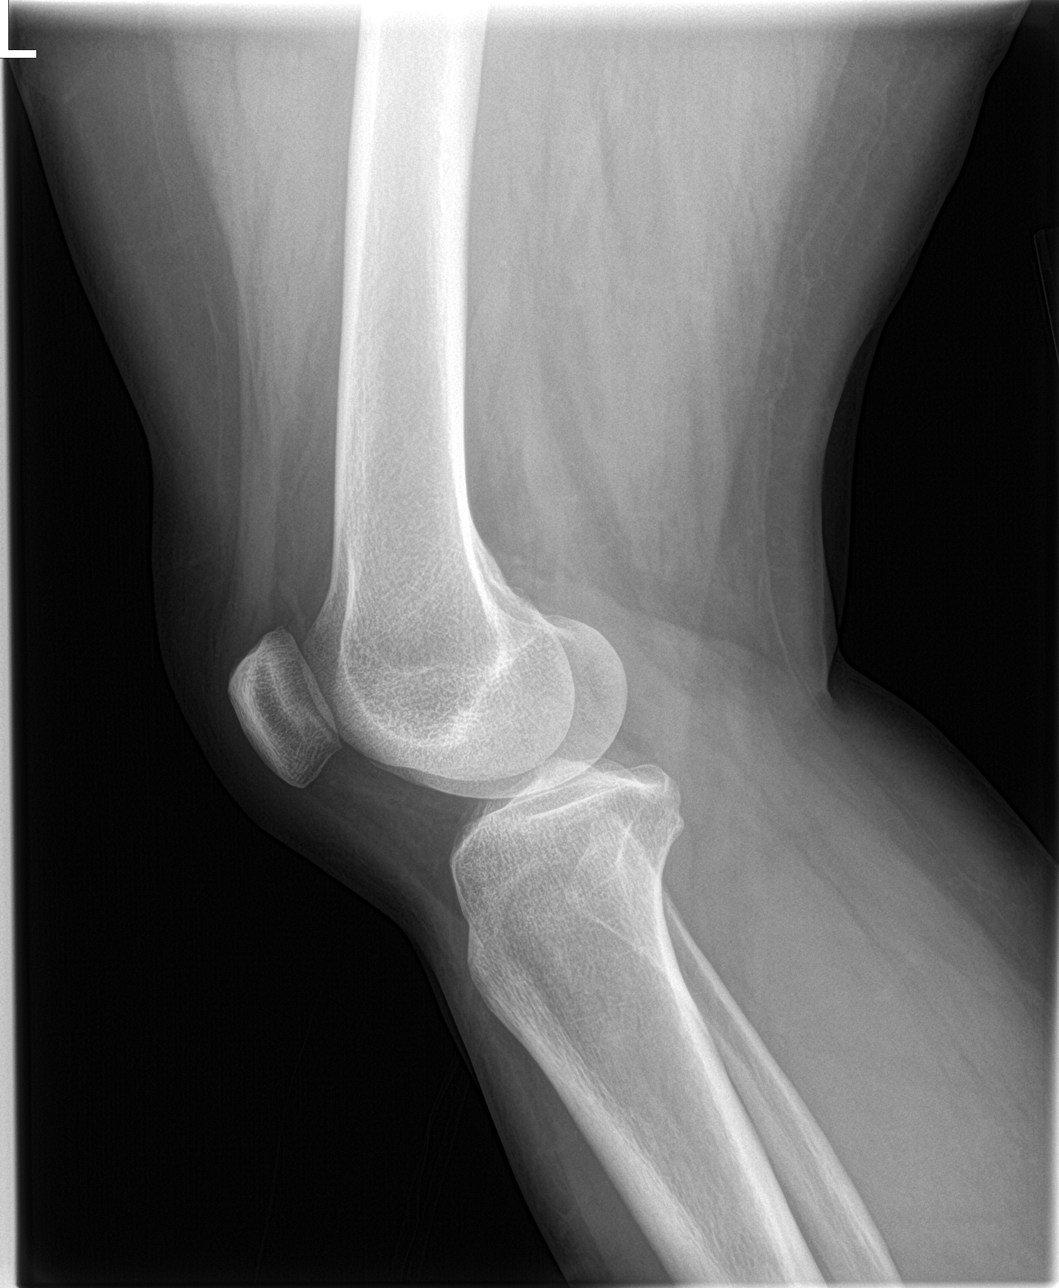

[knee obl (1 of 2)]
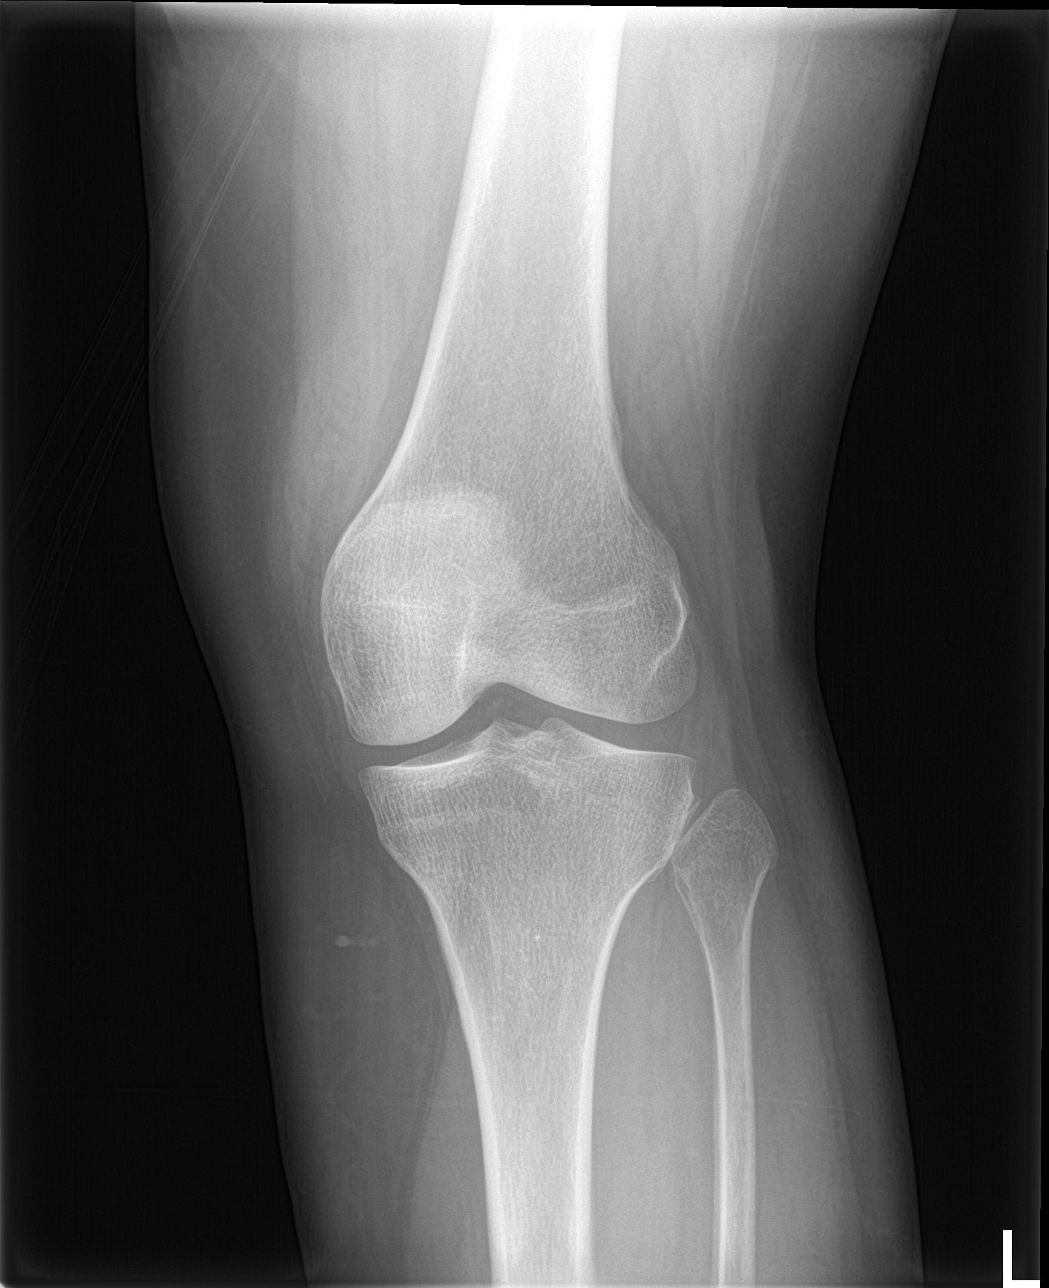

[knee obl (2 of 2)]
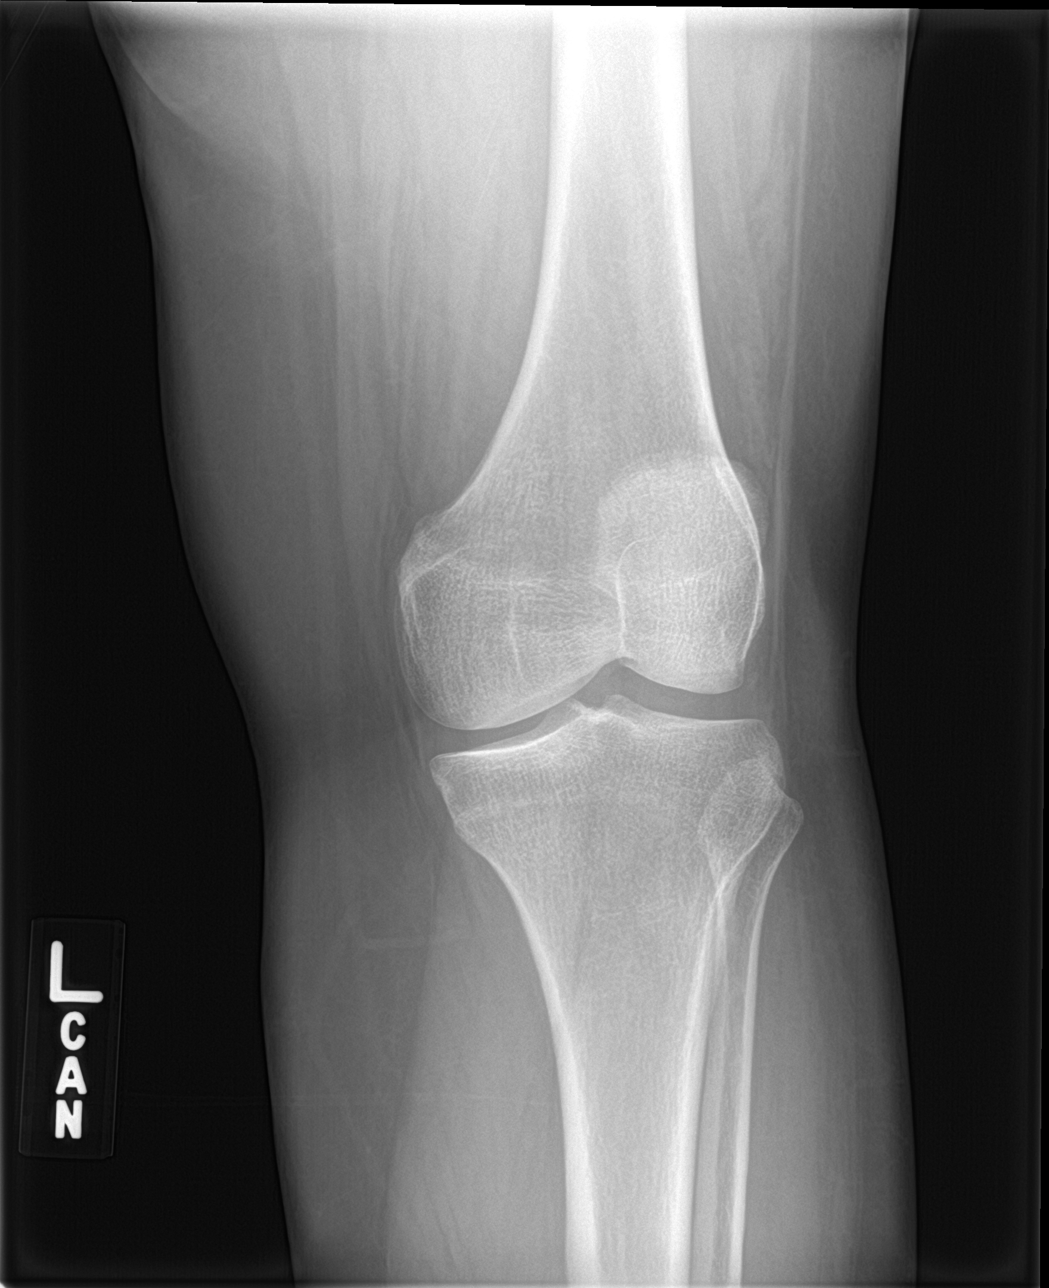

[4 of 4 positions shown; findings below may reference images not displayed]

FINDINGS: No evidence of fracture, dislocation, or joint effusion. No evidence
of arthropathy or other focal bone abnormality. Soft tissues are
unremarkable.
IMPRESSION: No acute osseous abnormality.

## 2020-07-21 ENCOUNTER — Other Ambulatory Visit: Payer: Self-pay

## 2020-07-21 DIAGNOSIS — M359 Systemic involvement of connective tissue, unspecified: Secondary | ICD-10-CM

## 2020-07-21 MED ORDER — HYDROXYCHLOROQUINE SULFATE 200 MG PO TABS
ORAL_TABLET | ORAL | 0 refills | Status: DC
Start: 2020-07-21 — End: 2020-10-26

## 2020-07-21 NOTE — Telephone Encounter (Signed)
Last Visit: 03/31/2020 Next Visit: 09/01/2020 Labs: 03/31/2020, CBC and CMP WNL.  ESR WNL.  Complements WNL.  UA revealed 2+ leukocytes.  Negative for bacteria and nitrites.  If she develops symptoms of a UTI sheshould reach out to PCP for further evaluation.  ANA negative and dsDNA is negative.   Labs are not consistent with a flare.  Continue on current dose of PLQ.  Nofurther recommendations at this time.   Eye exam: 06/25/2019, appt 08/27/2020  Current Dose per office note 03/31/2020: Plaquenil 200 mg 1 tablet twice daily Monday through Friday  CH:VGWGYFEETO disease (HCC)positive ANA, positive Ro and positive La antibody. malar rash, history of oral ulcers and sicca symptoms  Last Fill: 03/31/2020  Okay to refill Plaquenil?

## 2020-08-18 NOTE — Progress Notes (Signed)
Office Visit Note  Patient: [name]             Date of Birth: [birth year]           MRN: [id number]             PCP: Devra Dopp, MD Referring: Devra Dopp, MD Visit Date: 09/01/2020 Occupation: @GUAROCC @  Subjective:  Medication management.   History of Present Illness: [name] is a 45 y.o. female with a history of autoimmune disease.  She denies any joint pain or joint swelling.  She has been tolerating hydroxychloroquine well.  She denies any history of oral ulcers, nasal ulcers, malar rash, photosensitivity, Raynaud's phenomenon, lymphadenopathy or inflammatory arthritis.  Activities of Daily Living:  Patient reports morning stiffness for 5 minutes.   Patient Denies nocturnal pain.  Difficulty dressing/grooming: Denies Difficulty climbing stairs: Reports Difficulty getting out of chair: Denies Difficulty using hands for taps, buttons, cutlery, and/or writing: Denies  Review of Systems  Constitutional:  Positive for fatigue.  HENT:  Negative for mouth sores, mouth dryness and nose dryness.   Eyes:  Negative for pain, itching and dryness.  Respiratory:  Negative for shortness of breath and difficulty breathing.   Cardiovascular:  Negative for chest pain and palpitations.  Gastrointestinal:  Negative for blood in stool, constipation and diarrhea.  Endocrine: Negative for increased urination.  Genitourinary:  Negative for difficulty urinating.  Musculoskeletal:  Positive for morning stiffness. Negative for joint pain, joint pain, joint swelling, myalgias, muscle tenderness and myalgias.  Skin:  Negative for color change, rash and redness.  Allergic/Immunologic: Negative for susceptible to infections.  Neurological:  Positive for headaches. Negative for dizziness, numbness, memory loss and weakness.  Hematological:  Negative for bruising/bleeding tendency.  Psychiatric/Behavioral:  Negative for confusion.    PMFS History:  Patient Active Problem List    Diagnosis Date Noted   Primary insomnia 04/04/2017   Autoimmune disease (HCC)positive ANA, positive Ro and positive La antibody. malar rash, history of oral ulcers and sicca symptoms 07/22/2016   High risk medication use 07/22/2016   History of hypothyroidism 07/22/2016   History of gastroesophageal reflux (GERD) 07/22/2016   Osteopenia of multiple sites 07/22/2016   History of depression 07/22/2016    Past Medical History:  Diagnosis Date   Abnormal Pap smear 1996   Asthma    Dyspareunia 2002   Endometriosis 2004   H/O amenorrhea 2009   H/O candidiasis    H/O cervicitis 2002   H/O dysmenorrhea 2004   H/O tinea cruris 2002   H/O varicella    H/O vulvovaginitis 2002   H/O: menorrhagia 2003   Hx: UTI (urinary tract infection)    Hypothyroidism    Libido, decreased 2002   Menses, irregular 2004   Ovarian cyst, left 2008   PCB (post coital bleeding) 2003   Pelvic pain in female 2007    Family History  Problem Relation Age of Onset   Asthma Mother    Diabetes Mother    Depression Mother    Asthma Father    Heart disease Brother        Heart Murmur   Arthritis Maternal Grandmother    Cancer Maternal Grandmother        Breast & liver   Depression Maternal Grandmother    Heart disease Maternal Grandfather        MI   Asthma Maternal Grandfather    Diabetes Maternal Grandfather    Kidney disease Maternal Grandfather  Dialysis   Past Surgical History:  Procedure Laterality Date   BREAST SURGERY     COLONOSCOPY  12/2019   GASTRIC     NOVASURE ABLATION     TUBAL LIGATION     UPPER GI ENDOSCOPY  12/2019   WISDOM TOOTH EXTRACTION  [id number]   Social History   Social History Narrative   Not on file   Immunization History  Administered Date(s) Administered   [employer] Vaccination 09/17/2019, 10/15/2019     Objective: Vital Signs: BP 113/79 (BP Location: Left Arm, Patient Position: Sitting, Cuff Size: Normal)   Pulse 73   Ht 5\' 1"  (1.549 m)    Wt 191 lb 9.6 oz (86.9 kg)   BMI 36.20 kg/m    Physical Exam Vitals and nursing note reviewed.  Constitutional:      Appearance: She is well-developed.  HENT:     Head: Normocephalic and atraumatic.  Eyes:     Conjunctiva/sclera: Conjunctivae normal.  Cardiovascular:     Rate and Rhythm: Normal rate and regular rhythm.     Heart sounds: Normal heart sounds.  Pulmonary:     Effort: Pulmonary effort is normal.     Breath sounds: Normal breath sounds.  Abdominal:     General: Bowel sounds are normal.     Palpations: Abdomen is soft.  Musculoskeletal:     Cervical back: Normal range of motion.  Lymphadenopathy:     Cervical: No cervical adenopathy.  Skin:    General: Skin is warm and dry.     Capillary Refill: Capillary refill takes less than 2 seconds.  Neurological:     Mental Status: She is alert and oriented to person, place, and time.  Psychiatric:        Behavior: Behavior normal.     Musculoskeletal Exam: C-spine was in good range of motion.  Shoulder joints, elbow joints, wrist joints, MCPs PIPs and DIPs with good range of motion with no synovitis.  Hip joints, knee joints, ankles, MTPs and PIPs with good range of motion with no synovitis.  CDAI Exam: CDAI Score: -- Patient Global: --; Provider Global: -- Swollen: --; Tender: -- Joint Exam 09/01/2020   No joint exam has been documented for this visit   There is currently no information documented on the homunculus. Go to the Rheumatology activity and complete the homunculus joint exam.  Investigation: No additional findings.  Imaging: No results found.  Recent Labs: Lab Results  Component Value Date   WBC 5.2 03/31/2020   HGB 13.3 03/31/2020   PLT 291 03/31/2020   NA 140 03/31/2020   K 4.8 03/31/2020   CL 106 03/31/2020   CO2 28 03/31/2020   GLUCOSE 90 03/31/2020   BUN 17 03/31/2020   CREATININE 0.71 03/31/2020   BILITOT 0.3 03/31/2020   ALKPHOS 50 07/07/2017   AST 11 03/31/2020   ALT 11  03/31/2020   PROT 7.2 03/31/2020   ALBUMIN 3.9 07/07/2017   CALCIUM 9.3 03/31/2020   GFRAA 118 03/31/2020    Speciality Comments: PLQ eye exam: 06/25/2019 WNL  [facility] Follow up in 12 months.  Appt 08/27/2020 for PLQ eye exam  Procedures:  No procedures performed Allergies: Sulfa drugs cross reactors   Assessment / Plan:     Visit Diagnoses: Autoimmune disease (HCC)positive ANA, positive Ro and positive La antibody. malar rash, history of oral ulcers and sicca symptoms -patient is clinically doing well.  She denies any sicca symptoms, malar rash, oral ulcers, photosensitivity or inflammatory  arthritis.  Plan: Urinalysis, Routine w reflex microscopic, Anti-DNA antibody, double-stranded, C3 and C4, Sedimentation rate.  Her labs have been stable.  We may consider tapering Plaquenil at the follow-up visit if she continues to do well.  High risk medication use - Plaquenil 200 mg 1 tablet twice daily Monday through Friday. PLQ eye exam: 06/25/2019  - Plan: CBC with Differential/Platelet, COMPLETE METABOLIC PANEL WITH GFR today and then every 5 months.  She has been advised to get eye examination.  Recommendations regarding realization were placed in the AVS.  Increased risk of heart disease with autoimmune disease was discussed.  A handout on dietary modifications and exercise was placed in the AVS.  Primary osteoarthritis of both knees-she is off-and-on discomfort in her knees.  No warmth swelling or effusion was noted.  Osteopenia of multiple sites - DEXA updated on 08/07/2019: LFN BMD 0.735 with T-score -2.2.  History of gastric gastrectomy.   Iron deficiency anemia following bariatric surgery  Primary insomnia - ambien 10 mg at bedtime as needed for insomnia.   History of hypothyroidism  History of gastroesophageal reflux (GERD)  History of depression  Orders: Orders Placed This Encounter  Procedures   CBC with Differential/Platelet   COMPLETE METABOLIC PANEL WITH GFR    Urinalysis, Routine w reflex microscopic   Anti-DNA antibody, double-stranded   C3 and C4   Sedimentation rate    No orders of the defined types were placed in this encounter.    Follow-Up Instructions: Return in about 5 months (around 02/01/2021) for Autoimmune disease.   Pollyann Savoy, MD  Note - This record has been created using Animal nutritionist.  Chart creation errors have been sought, but may not always  have been located. Such creation errors do not reflect on  the standard of medical care.

## 2020-09-01 ENCOUNTER — Other Ambulatory Visit: Payer: Self-pay

## 2020-09-01 ENCOUNTER — Ambulatory Visit: Payer: Managed Care, Other (non HMO) | Admitting: Rheumatology

## 2020-09-01 ENCOUNTER — Encounter: Payer: Self-pay | Admitting: Rheumatology

## 2020-09-01 VITALS — BP 113/79 | HR 73 | Ht 61.0 in | Wt 191.6 lb

## 2020-09-01 DIAGNOSIS — F5101 Primary insomnia: Secondary | ICD-10-CM

## 2020-09-01 DIAGNOSIS — Z79899 Other long term (current) drug therapy: Secondary | ICD-10-CM | POA: Diagnosis not present

## 2020-09-01 DIAGNOSIS — D508 Other iron deficiency anemias: Secondary | ICD-10-CM

## 2020-09-01 DIAGNOSIS — M359 Systemic involvement of connective tissue, unspecified: Secondary | ICD-10-CM | POA: Diagnosis not present

## 2020-09-01 DIAGNOSIS — M170 Bilateral primary osteoarthritis of knee: Secondary | ICD-10-CM | POA: Diagnosis not present

## 2020-09-01 DIAGNOSIS — M8589 Other specified disorders of bone density and structure, multiple sites: Secondary | ICD-10-CM

## 2020-09-01 DIAGNOSIS — M722 Plantar fascial fibromatosis: Secondary | ICD-10-CM

## 2020-09-01 DIAGNOSIS — Z8719 Personal history of other diseases of the digestive system: Secondary | ICD-10-CM

## 2020-09-01 DIAGNOSIS — Z8659 Personal history of other mental and behavioral disorders: Secondary | ICD-10-CM

## 2020-09-01 DIAGNOSIS — K9589 Other complications of other bariatric procedure: Secondary | ICD-10-CM

## 2020-09-01 DIAGNOSIS — Z8639 Personal history of other endocrine, nutritional and metabolic disease: Secondary | ICD-10-CM

## 2020-09-01 NOTE — Patient Instructions (Signed)
Vaccines You are taking a medication(s) that can suppress your immune system.  The following immunizations are recommended: Flu annually Covid-19  Td/Tdap (tetanus, diphtheria, pertussis) every 10 years Pneumonia (Prevnar 15 then Pneumovax 23 at least 1 year apart.  Alternatively, can take Prevnar 20 without needing additional dose) Shingrix: 2 doses from 4 weeks to 6 months apart  Please check with your PCP to make sure you are up to date.   Heart Disease Prevention   Your inflammatory disease increases your risk of heart disease which includes heart attack, stroke, atrial fibrillation (irregular heartbeats), high blood pressure, heart failure and atherosclerosis (plaque in the arteries).  It is important to reduce your risk by:   Keep blood pressure, cholesterol, and blood sugar at healthy levels   Smoking Cessation   Maintain a healthy weight  BMI 20-25   Eat a healthy diet  Plenty of fresh fruit, vegetables, and whole grains  Limit saturated fats, foods high in sodium, and added sugars  DASH and Mediterranean diet   Increase physical activity  Recommend moderate physically activity for 150 minutes per week/ 30 minutes a day for five days a week These can be broken up into three separate ten-minute sessions during the day.   Reduce Stress  Meditation, slow breathing exercises, yoga, coloring books  Dental visits twice a year   

## 2020-09-02 LAB — CBC WITH DIFFERENTIAL/PLATELET
Absolute Monocytes: 390 cells/uL (ref 200–950)
Basophils Absolute: 43 cells/uL (ref 0–200)
Basophils Relative: 0.7 %
Eosinophils Absolute: 140 cells/uL (ref 15–500)
Eosinophils Relative: 2.3 %
HCT: 41.6 % (ref 35.0–45.0)
Hemoglobin: 13.4 g/dL (ref 11.7–15.5)
Lymphs Abs: 1275 cells/uL (ref 850–3900)
MCH: 29 pg (ref 27.0–33.0)
MCHC: 32.2 g/dL (ref 32.0–36.0)
MCV: 90 fL (ref 80.0–100.0)
MPV: 10.2 fL (ref 7.5–12.5)
Monocytes Relative: 6.4 %
Neutro Abs: 4252 cells/uL (ref 1500–7800)
Neutrophils Relative %: 69.7 %
Platelets: 230 10*3/uL (ref 140–400)
RBC: 4.62 10*6/uL (ref 3.80–5.10)
RDW: 12 % (ref 11.0–15.0)
Total Lymphocyte: 20.9 %
WBC: 6.1 10*3/uL (ref 3.8–10.8)

## 2020-09-02 LAB — COMPLETE METABOLIC PANEL WITH GFR
AG Ratio: 1.8 (calc) (ref 1.0–2.5)
ALT: 12 U/L (ref 6–29)
AST: 11 U/L (ref 10–35)
Albumin: 4.5 g/dL (ref 3.6–5.1)
Alkaline phosphatase (APISO): 49 U/L (ref 31–125)
BUN: 20 mg/dL (ref 7–25)
CO2: 29 mmol/L (ref 20–32)
Calcium: 8.9 mg/dL (ref 8.6–10.2)
Chloride: 106 mmol/L (ref 98–110)
Creat: 0.63 mg/dL (ref 0.50–0.99)
Globulin: 2.5 g/dL (calc) (ref 1.9–3.7)
Glucose, Bld: 117 mg/dL — ABNORMAL HIGH (ref 65–99)
Potassium: 4 mmol/L (ref 3.5–5.3)
Sodium: 141 mmol/L (ref 135–146)
Total Bilirubin: 0.2 mg/dL (ref 0.2–1.2)
Total Protein: 7 g/dL (ref 6.1–8.1)
eGFR: 110 mL/min/{1.73_m2} (ref >=60)

## 2020-09-02 LAB — URINALYSIS, ROUTINE W REFLEX MICROSCOPIC
Bilirubin Urine: NEGATIVE
Glucose, UA: NEGATIVE
Hgb urine dipstick: NEGATIVE
Ketones, ur: NEGATIVE
Leukocytes,Ua: NEGATIVE
Nitrite: NEGATIVE
Protein, ur: NEGATIVE
Specific Gravity, Urine: 1.023 (ref 1.001–1.035)
pH: <=5 (ref 5.0–8.0)

## 2020-09-02 LAB — C3 AND C4
C3 Complement: 114 mg/dL (ref 83–193)
C4 Complement: 24 mg/dL (ref 15–57)

## 2020-09-02 LAB — ANTI-DNA ANTIBODY, DOUBLE-STRANDED: ds DNA Ab: 1 IU/mL

## 2020-09-02 LAB — SEDIMENTATION RATE: Sed Rate: 6 mm/h (ref 0–20)

## 2020-09-14 ENCOUNTER — Encounter: Payer: Self-pay | Admitting: Rheumatology

## 2020-09-15 NOTE — Telephone Encounter (Signed)
I am unsure if she is referring to cortisone injections or visco gel injections.  I would recommend updating x-rays and then we can further discuss cortisone vs. Visco at that appointment.

## 2020-10-26 ENCOUNTER — Other Ambulatory Visit: Payer: Self-pay | Admitting: Physician Assistant

## 2020-10-26 ENCOUNTER — Other Ambulatory Visit: Payer: Self-pay

## 2020-10-26 DIAGNOSIS — M359 Systemic involvement of connective tissue, unspecified: Secondary | ICD-10-CM

## 2020-10-26 NOTE — Telephone Encounter (Signed)
Next Visit: 02/02/2021  Last Visit: 09/01/2020  Labs: 09/01/2020 CBC WNL. Glucose is 117. Rest of CMP WNL.   Eye exam: 06/25/2019 WNL     Current Dose per office note 09/01/2020: Plaquenil 200 mg 1 tablet twice daily Monday through Friday  DP:TELMRAJHHI disease   Last Fill: 07/21/2020  Left message to advise patient she needs to update PLQ eye exam ASAP.   Okay to refill Plaquenil?

## 2020-11-02 ENCOUNTER — Other Ambulatory Visit: Payer: Self-pay

## 2020-11-02 ENCOUNTER — Emergency Department (HOSPITAL_BASED_OUTPATIENT_CLINIC_OR_DEPARTMENT_OTHER): Payer: Managed Care, Other (non HMO)

## 2020-11-02 ENCOUNTER — Encounter (HOSPITAL_BASED_OUTPATIENT_CLINIC_OR_DEPARTMENT_OTHER): Payer: Self-pay | Admitting: Emergency Medicine

## 2020-11-02 ENCOUNTER — Emergency Department (HOSPITAL_BASED_OUTPATIENT_CLINIC_OR_DEPARTMENT_OTHER)
Admission: EM | Admit: 2020-11-02 | Discharge: 2020-11-02 | Disposition: A | Discharge status: Home / Self Care | Payer: Managed Care, Other (non HMO) | Attending: Student | Admitting: Student

## 2020-11-02 DIAGNOSIS — E039 Hypothyroidism, unspecified: Secondary | ICD-10-CM | POA: Insufficient documentation

## 2020-11-02 DIAGNOSIS — J45909 Unspecified asthma, uncomplicated: Secondary | ICD-10-CM | POA: Insufficient documentation

## 2020-11-02 DIAGNOSIS — R109 Unspecified abdominal pain: Secondary | ICD-10-CM | POA: Diagnosis present

## 2020-11-02 DIAGNOSIS — R112 Nausea with vomiting, unspecified: Secondary | ICD-10-CM | POA: Diagnosis not present

## 2020-11-02 DIAGNOSIS — R197 Diarrhea, unspecified: Secondary | ICD-10-CM | POA: Insufficient documentation

## 2020-11-02 DIAGNOSIS — Z20822 Contact with and (suspected) exposure to covid-19: Secondary | ICD-10-CM | POA: Diagnosis not present

## 2020-11-02 LAB — LIPASE, BLOOD: Lipase: 33 U/L (ref 11–51)

## 2020-11-02 LAB — COMPREHENSIVE METABOLIC PANEL
ALT: 13 U/L (ref 0–44)
AST: 19 U/L (ref 15–41)
Albumin: 4 g/dL (ref 3.5–5.0)
Alkaline Phosphatase: 43 U/L (ref 38–126)
Anion gap: 8 (ref 5–15)
BUN: 21 mg/dL — ABNORMAL HIGH (ref 6–20)
CO2: 24 mmol/L (ref 22–32)
Calcium: 8.3 mg/dL — ABNORMAL LOW (ref 8.9–10.3)
Chloride: 105 mmol/L (ref 98–111)
Creatinine, Ser: 0.63 mg/dL (ref 0.44–1.00)
GFR, Estimated: >60 mL/min (ref >60)
Glucose, Bld: 122 mg/dL — ABNORMAL HIGH (ref 70–99)
Potassium: 3.7 mmol/L (ref 3.5–5.1)
Sodium: 137 mmol/L (ref 135–145)
Total Bilirubin: 0.4 mg/dL (ref 0.3–1.2)
Total Protein: 7.2 g/dL (ref 6.5–8.1)

## 2020-11-02 LAB — CBC
HCT: 41.1 % (ref 36.0–46.0)
Hemoglobin: 13.4 g/dL (ref 12.0–15.0)
MCH: 28.8 pg (ref 26.0–34.0)
MCHC: 32.6 g/dL (ref 30.0–36.0)
MCV: 88.4 fL (ref 80.0–100.0)
Platelets: 242 10*3/uL (ref 150–400)
RBC: 4.65 MIL/uL (ref 3.87–5.11)
RDW: 13 % (ref 11.5–15.5)
WBC: 6.5 10*3/uL (ref 4.0–10.5)
nRBC: 0 % (ref 0.0–0.2)

## 2020-11-02 LAB — URINALYSIS, MICROSCOPIC (REFLEX): WBC, UA: NONE SEEN WBC/hpf (ref 0–5)

## 2020-11-02 LAB — URINALYSIS, ROUTINE W REFLEX MICROSCOPIC
Bilirubin Urine: NEGATIVE
Glucose, UA: NEGATIVE mg/dL
Ketones, ur: NEGATIVE mg/dL
Leukocytes,Ua: NEGATIVE
Nitrite: NEGATIVE
Protein, ur: NEGATIVE mg/dL
Specific Gravity, Urine: 1.025 (ref 1.005–1.030)
pH: 6.5 (ref 5.0–8.0)

## 2020-11-02 LAB — PREGNANCY, URINE: Preg Test, Ur: NEGATIVE

## 2020-11-02 MED ORDER — ACETAMINOPHEN 500 MG PO TABS
1000.0000 mg | ORAL_TABLET | Freq: Once | ORAL | Status: AC
  Administered 2020-11-02: 1000 mg via ORAL

## 2020-11-02 MED ORDER — ONDANSETRON 4 MG PO TBDP
8.0000 mg | ORAL_TABLET | Freq: Once | ORAL | Status: AC
  Administered 2020-11-02: 8 mg via ORAL

## 2020-11-02 MED ORDER — ONDANSETRON 4 MG PO TBDP
ORAL_TABLET | ORAL | Status: AC
  Administered 2020-11-02: 4 mg
  Filled 2020-11-02: qty 2

## 2020-11-02 MED ORDER — DICYCLOMINE HCL 20 MG PO TABS: 20.0000 mg | ORAL_TABLET | Freq: Two times a day (BID) | ORAL | 0 refills | Status: AC

## 2020-11-02 MED ORDER — KETOROLAC TROMETHAMINE 15 MG/ML IJ SOLN
15.0000 mg | Freq: Once | INTRAMUSCULAR | Status: AC
  Administered 2020-11-02: 15 mg via INTRAVENOUS
  Filled 2020-11-02: qty 1

## 2020-11-02 MED ORDER — ONDANSETRON 4 MG PO TBDP: 4.0000 mg | ORAL_TABLET | Freq: Three times a day (TID) | ORAL | 0 refills | Status: DC | PRN

## 2020-11-02 MED ORDER — ACETAMINOPHEN 500 MG PO TABS
ORAL_TABLET | ORAL | Status: AC
  Filled 2020-11-02: qty 2

## 2020-11-02 MED ORDER — SODIUM CHLORIDE 0.9 % IV BOLUS
1000.0000 mL | Freq: Once | INTRAVENOUS | Status: AC
  Administered 2020-11-02: 1000 mL via INTRAVENOUS

## 2020-11-02 MED ORDER — ONDANSETRON HCL 4 MG/2ML IJ SOLN
4.0000 mg | Freq: Once | INTRAMUSCULAR | Status: AC
  Administered 2020-11-02: 4 mg via INTRAVENOUS
  Filled 2020-11-02: qty 2

## 2020-11-02 MED ORDER — ONDANSETRON HCL 8 MG PO TABS: 8.0000 mg | ORAL_TABLET | Freq: Once | ORAL | Status: DC

## 2020-11-02 MED ORDER — IOHEXOL 300 MG/ML  SOLN
100.0000 mL | Freq: Once | INTRAMUSCULAR | Status: AC | PRN
  Administered 2020-11-02: 100 mL via INTRAVENOUS

## 2020-11-02 NOTE — ED Provider Notes (Signed)
[facility] EMERGENCY DEPARTMENT Provider Note   CSN: [id number] Arrival date & time: 11/02/20  1648     History Chief Complaint  Patient presents with   Emesis    [name] is a 45 y.o. female with a past medical history of asthma, nephrolithiasis and gastric sleeve performed 6 years ago presenting today with complaint of abdominal pain, nausea, vomiting and diarrhea.  She reports that all of the symptoms began at the same time earlier this morning.  Denies known sick contacts.  No recent changes to her diet and says she has diarrhea with every bowel movement.  Reports the pain is for the most part localized to the right lower quadrant and groin.  No urinary symptoms.  No hematemesis or hematochezia or melena.  Denies any fevers.  Reports that the Zofran given to her in the waiting room did help her nausea.  Complaining of headache with photophobia.    Past Medical History:  Diagnosis Date   Abnormal Pap smear 1996   Asthma    Dyspareunia 2002   Endometriosis 2004   H/O amenorrhea 2009   H/O candidiasis    H/O cervicitis 2002   H/O dysmenorrhea 2004   H/O tinea cruris 2002   H/O varicella    H/O vulvovaginitis 2002   H/O: menorrhagia 2003   Hx: UTI (urinary tract infection)    Hypothyroidism    Libido, decreased 2002   Menses, irregular 2004   Ovarian cyst, left 2008   PCB (post coital bleeding) 2003   Pelvic pain in female 2007    Patient Active Problem List   Diagnosis Date Noted   Primary insomnia 04/04/2017   Autoimmune disease (HCC)positive ANA, positive Ro and positive La antibody. malar rash, history of oral ulcers and sicca symptoms 07/22/2016   High risk medication use 07/22/2016   History of hypothyroidism 07/22/2016   History of gastroesophageal reflux (GERD) 07/22/2016   Osteopenia of multiple sites 07/22/2016   History of depression 07/22/2016    Past Surgical History:  Procedure Laterality Date   BREAST SURGERY     COLONOSCOPY   12/2019   GASTRIC     NOVASURE ABLATION     TUBAL LIGATION     UPPER GI ENDOSCOPY  12/2019   WISDOM TOOTH EXTRACTION  [id number]     OB History     Gravida  3   Para  3   Term  3   Preterm      AB      Living  3      SAB      IAB      Ectopic      Multiple      Live Births  3           Family History  Problem Relation Age of Onset   Asthma Mother    Diabetes Mother    Depression Mother    Asthma Father    Heart disease Brother        Heart Murmur   Arthritis Maternal Grandmother    Cancer Maternal Grandmother        Breast & liver   Depression Maternal Grandmother    Heart disease Maternal Grandfather        MI   Asthma Maternal Grandfather    Diabetes Maternal Grandfather    Kidney disease Maternal Grandfather        Dialysis    Social History   Tobacco Use   Smoking  status: Never   Smokeless tobacco: Never  Vaping Use   Vaping Use: Never used  Substance Use Topics   Alcohol use: Yes   Drug use: No    Home Medications Prior to Admission medications   Medication Sig Start Date End Date Taking? Authorizing Provider  albuterol (PROVENTIL HFA;VENTOLIN HFA) 108 (90 Base) MCG/ACT inhaler Inhale 2 puffs into the lungs every 6 (six) hours as needed for wheezing or shortness of breath.     [provider]  buPROPion (WELLBUTRIN XL) 300 MG 24 hr tablet Take 300 mg by mouth daily.    [provider]  busPIRone (BUSPAR) 10 MG tablet Take 10 mg by mouth daily. 09/04/19   [provider]  Calcium Carbonate-Vitamin D 500-125 MG-UNIT TABS take 1 tablet by mouth daily    [provider]  CHOLECALCIFEROL PO Take 1 tablet by mouth daily.    [provider]  citalopram (CELEXA) 40 MG tablet Take 40 mg by mouth daily. 09/04/19 09/01/20  [provider]  hydroxychloroquine (PLAQUENIL) 200 MG tablet TAKE ONE TABLET BY MOUTH TWICE A DAY MONDAY TO FRIDAY ONLY 10/26/20   Ofilia Neas, PA-C  levothyroxine  (SYNTHROID, LEVOTHROID) 125 MCG tablet Take 125 mcg by mouth daily.    [provider]  Multiple Vitamin (THERA) TABS Take 1 tablet by mouth daily.     [provider]  omeprazole (PRILOSEC) 40 MG capsule Take 40 mg by mouth daily.    [provider]  TURMERIC PO Take 1 capsule by mouth as needed.    [provider]  zolpidem (AMBIEN) 10 MG tablet Take 1 tablet by mouth at bedtime as needed for sleep    [provider]    Allergies    Sulfa drugs cross reactors  Review of Systems   Review of Systems  Constitutional:  Positive for chills. Negative for fever.  HENT:  Negative for congestion and sore throat.   Respiratory:  Negative for shortness of breath.   Cardiovascular:  Negative for chest pain and palpitations.  Gastrointestinal:  Positive for diarrhea, nausea and vomiting. Negative for blood in stool.  Genitourinary:  Negative for dysuria, hematuria, urgency, vaginal bleeding and vaginal discharge.  Musculoskeletal:  Positive for myalgias. Negative for back pain.  Neurological:  Positive for dizziness and headaches. Negative for syncope and numbness.   Physical Exam Updated Vital Signs BP (!) 105/57 (BP Location: Left Arm)   Pulse (!) 102   Temp 99.8 F (37.7 C) (Oral)   Resp 20   Ht 5\' 1"  (1.549 m)   Wt 85.7 kg   SpO2 99%   BMI 35.71 kg/m   Physical Exam Vitals and nursing note reviewed.  Constitutional:      Appearance: Normal appearance. She is ill-appearing. She is not toxic-appearing or diaphoretic.  HENT:     Head: Normocephalic and atraumatic.     Right Ear: Tympanic membrane and ear canal normal.     Left Ear: Tympanic membrane and ear canal normal.     Nose: Nose normal.     Mouth/Throat:     Mouth: Mucous membranes are moist.     Pharynx: Oropharynx is clear. No posterior oropharyngeal erythema.  Eyes:     General: No scleral icterus.    Conjunctiva/sclera: Conjunctivae normal.  Cardiovascular:     Rate and  Rhythm: Normal rate and regular rhythm.  Pulmonary:     Effort: Pulmonary effort is normal. No respiratory distress.     Breath  sounds: No wheezing or rales.  Abdominal:     General: Abdomen is flat.     Palpations: Abdomen is soft.     Tenderness: There is abdominal tenderness. There is no guarding.  Lymphadenopathy:     Cervical: No cervical adenopathy.  Skin:    General: Skin is warm and dry.     Findings: No rash.  Neurological:     General: No focal deficit present.     Mental Status: She is alert.     Cranial Nerves: No cranial nerve deficit.     Motor: No weakness.  Psychiatric:        Mood and Affect: Mood normal.        Behavior: Behavior normal.    ED Results / Procedures / Treatments   Labs (all labs ordered are listed, but only abnormal results are displayed) Labs Reviewed  COMPREHENSIVE METABOLIC PANEL - Abnormal; Notable for the following components:      Result Value   Glucose, Bld 122 (*)    BUN 21 (*)    Calcium 8.3 (*)    All other components within normal limits  URINALYSIS, ROUTINE W REFLEX MICROSCOPIC - Abnormal; Notable for the following components:   Hgb urine dipstick SMALL (*)    All other components within normal limits  URINALYSIS, MICROSCOPIC (REFLEX) - Abnormal; Notable for the following components:   Bacteria, UA RARE (*)    All other components within normal limits  RESP PANEL BY RT-PCR (FLU A&B, COVID) ARPGX2  LIPASE, BLOOD  CBC  PREGNANCY, URINE    EKG None  Radiology No results found.  Procedures Procedures   Medications Ordered in ED Medications  ondansetron (ZOFRAN-ODT) 4 MG disintegrating tablet (4 mg  Given 11/02/20 1730)  ondansetron (ZOFRAN-ODT) disintegrating tablet 8 mg (8 mg Oral Given 11/02/20 1736)  sodium chloride 0.9 % bolus 1,000 mL (1,000 mLs Intravenous New Bag/Given 11/02/20 1945)  ketorolac (TORADOL) 15 MG/ML injection 15 mg (15 mg Intravenous Given 11/02/20 1947)  ondansetron (ZOFRAN) injection 4 mg (4 mg  Intravenous Given 11/02/20 1946)  iohexol (OMNIPAQUE) 300 MG/ML solution 100 mL (100 mLs Intravenous Contrast Given 11/02/20 1954)    ED Course  I have reviewed the triage vital signs and the nursing notes.  Pertinent labs & imaging results that were available during my care of the patient were reviewed by me and considered in my medical decision making (see chart for details).    MDM Rules/Calculators/A&P The differential diagnosis for generalized abdominal pain includes, but is not limited to AAA, gastroenteritis, appendicitis, Bowel obstruction, Bowel perforation. Gastroparesis, DKA, Hernia, Inflammatory bowel disease, mesenteric ischemia, pancreatitis, peritonitis SBP, volvulus.  All of these were considered throughout the evaluation of the patient.  Low suspicion for bowel obstruction, still passing gas and having bowel movements.  No signs of DKA, patient is without diabetes.  Presentation inconsistent with AAA or dissection.  No hernias felt on physical exam.  Lipase within normal limits, low suspicion for pancreatitis.  White count normal and patient is currently afebrile-low concern for appendicitis or other acute abdominal infection.  I evaluated the patient in the presence of her husband.  She appeared uncomfortable and in a prone position on the bed.  She was able to provide her own history, but endorsed nausea during my physical exam. Said that Zofran made her feel better-another dose of Zofran given.  Also given Toradol and a bolus of fluids.  Blood work unremarkable.  Urine positive for hematuria, patient denying symptoms of  urinary tract infection or kidney stone.  Patient reports a history of kidney stones however does not feel like this is the same pain.  Reports that the last time she felt this bad she had the flu.  Because she has right lower quadrant and groin pain I will obtain a CT abdomen pelvis.  This is pending at the time of shift change.  Patient signed out to [facility].  See  her note for further work-up and ultimate disposition.  I suspect discharge home if CT is normal and patient passes a PO challenge.  Final Clinical Impression(s) / ED Diagnoses Final diagnoses:  None    Rx / DC Orders Patient signed out to PA [facility] at shift change.    Darliss Ridgel 11/02/20 2010    Teressa Lower, MD 11/02/20 [phone]

## 2020-11-02 NOTE — ED Notes (Signed)
Patient transported to CT 

## 2020-11-02 NOTE — ED Notes (Signed)
Sprite given per po challenge

## 2020-11-02 NOTE — ED Triage Notes (Signed)
N/V/D since Sunday feels weak

## 2020-11-02 NOTE — ED Provider Notes (Signed)
Care assumed from [facility], [place], at shift change, please see their notes for full documentation of patient's complaint/HPI. Briefly, pt here with abdominal pain, nausea, vomiting.  Awaiting CT A/P. Plan is to dispo accordingly.   Physical Exam  BP (!) 108/55 (BP Location: Left Arm)   Pulse 83   Temp 99.8 F (37.7 C) (Oral)   Resp 14   Ht 5\' 1"  (1.549 m)   Wt 85.7 kg   SpO2 100%   BMI 35.71 kg/m   Physical Exam Vitals and nursing note reviewed.  Constitutional:      Appearance: She is not ill-appearing.  HENT:     Head: Normocephalic and atraumatic.  Eyes:     Conjunctiva/sclera: Conjunctivae normal.  Cardiovascular:     Rate and Rhythm: Normal rate and regular rhythm.  Pulmonary:     Effort: Pulmonary effort is normal.     Breath sounds: Normal breath sounds.  Skin:    General: Skin is warm and dry.     Coloration: Skin is not jaundiced.  Neurological:     Mental Status: She is alert.    ED Course/Procedures   Clinical Course as of 11/02/20 2249  Mon Nov 02, 2020  2138 CT ABDOMEN PELVIS W CONTRAST IMPRESSION: No acute process identified. Chronic findings noted previously and unchanged.   [MV]    Clinical Course User Index [MV] 2139, PA-C    Procedures  MDM  CT negative. Pt able to tolerate PO here without difficulty. Requesting something for her headache - tylenol provided with improvement. Pt discharged home at this time with PCP follow up. Recommended following up on COVID/flu testing. She is in agreement with plan and stable for discharge.   This note was prepared using Dragon voice recognition software and may include unintentional dictation errors due to the inherent limitations of voice recognition software.        Tanda Rockers, PA-C 11/02/20 2254    11/04/20, MD 11/02/20 [phone]

## 2020-11-02 NOTE — Discharge Instructions (Signed)
Please follow up with your PCP regarding ED visit today  Pick up medications and take as prescribed. Drink plenty of fluids to stay hydrated.   You can log into MyChart and check the results of your COVID and flu testing  Return to the ED for any new/worsening symptoms

## 2020-11-03 LAB — RESP PANEL BY RT-PCR (FLU A&B, COVID) ARPGX2
Influenza A by PCR: NEGATIVE
Influenza B by PCR: NEGATIVE
SARS Coronavirus 2 by RT PCR: NEGATIVE

## 2020-12-02 ENCOUNTER — Other Ambulatory Visit: Payer: Self-pay | Admitting: *Deleted

## 2020-12-02 DIAGNOSIS — M359 Systemic involvement of connective tissue, unspecified: Secondary | ICD-10-CM

## 2020-12-02 MED ORDER — HYDROXYCHLOROQUINE SULFATE 200 MG PO TABS: ORAL_TABLET | ORAL | 0 refills | Status: DC

## 2020-12-02 NOTE — Telephone Encounter (Signed)
Next Visit: 02/02/2021   Last Visit: 09/01/2020   Labs: 09/01/2020 CBC WNL. Glucose is 117. Rest of CMP WNL.    Eye exam: 06/25/2019 WNL      Current Dose per office note 09/01/2020: Plaquenil 200 mg 1 tablet twice daily Monday through Friday   BJ:SEGBTDVVOH disease    Last Fill: 10/26/2020 (30 day supply)   Patient states she update her PLQ eye exam a couple of weeks ago and will have eye doctor fax results.    Okay to refill Plaquenil?

## 2020-12-15 MED ORDER — HYDROXYCHLOROQUINE SULFATE 200 MG PO TABS: ORAL_TABLET | ORAL | 0 refills | Status: DC

## 2020-12-15 NOTE — Addendum Note (Signed)
Addended by: Henriette Combs on: 12/15/2020 02:01 PM   Modules accepted: Orders

## 2021-01-07 ENCOUNTER — Telehealth: Payer: Self-pay | Admitting: *Deleted

## 2021-01-07 NOTE — Telephone Encounter (Signed)
Received a potential drug to drug interaction from [facility].   Possible interaction between: HYDROXYCHLOROQUINE SULFATE and CITALOPRAM   Possible Risks: the potential to prolong the QT intervals.  Reviewed by: Dr. Pollyann Savoy   Recommendations: Please advise patient to discuss interaction between hydroxychloroquine and Celexa. Please forward the note to her PCP.   Notified patient of possible interactions between HYDROXYCHLOROQUINE SULFATE and CITALOPRAM.  Advised risk include: the potential to prolong the QT intervals. Patient advised of the following recommendations: to discuss with PCP the interaction between medications. Patient expressed understanding and states her PCP is aware as he received the same notification. Patient states he has sent her for an EKG to ensure that everything was normal.

## 2021-01-19 NOTE — Progress Notes (Signed)
Office Visit Note  Patient: [name]             Date of Birth: 1975-07-15           MRN: [id number]             PCP: Helane Rima, MD Referring: Helane Rima, MD Visit Date: 02/02/2021 Occupation: _0 @  Subjective:  Intermittent pain in both knees  History of Present Illness: [name] is a 46 y.o. female with history of autoimmune disease and osteoarthritis. She is taking plaquenil 200 mg 1 tablet by mouth twice daily Monday through Friday.  She is tolerating plaquenil without any side effects.  She denies any signs or symptoms of an autoimmune disease flare since her last office visit.  She has not had any symptoms of Raynaud's, recent rashes, oral or nasal ulcerations, sicca symptoms, swollen lymph nodes, fevers, shortness of breath, pleuritic chest pain, or palpitations.  She continues to have some hair thinning.  Overall her energy level has been stable and she has been sleeping well at night.  She experiences intermittent pain in both knees which is typically exacerbated by going up and down steps.  She has not had any joint swelling.  She denies any other joint pain recently.  She denies any nocturnal pain.  She denies any other new medical conditions or medication changes since her last office visit.     Activities of Daily Living:  Patient reports morning stiffness for 5-10 minutes.   Patient Denies nocturnal pain.  Difficulty dressing/grooming: Denies Difficulty climbing stairs: Reports Difficulty getting out of chair: Denies Difficulty using hands for taps, buttons, cutlery, and/or writing: Denies  Review of Systems  Constitutional:  Positive for fatigue.  HENT:  Negative for mouth sores, mouth dryness and nose dryness.   Eyes:  Negative for pain, itching and dryness.  Respiratory:  Negative for shortness of breath and difficulty breathing.   Cardiovascular:  Negative for chest pain and palpitations.  Gastrointestinal:  Negative for blood in  stool, constipation and diarrhea.  Endocrine: Negative for increased urination.  Genitourinary:  Negative for difficulty urinating.  Musculoskeletal:  Positive for morning stiffness. Negative for joint pain, joint pain, joint swelling, myalgias, muscle tenderness and myalgias.  Skin:  Negative for color change, rash and redness.  Allergic/Immunologic: Negative for susceptible to infections.  Neurological:  Negative for dizziness, numbness, headaches, memory loss and weakness.  Hematological:  Negative for bruising/bleeding tendency.  Psychiatric/Behavioral:  Negative for confusion.    PMFS History:  Patient Active Problem List   Diagnosis Date Noted   Primary insomnia 04/04/2017   Autoimmune disease (HCC)positive ANA, positive Ro and positive La antibody. malar rash, history of oral ulcers and sicca symptoms 07/22/2016   High risk medication use 07/22/2016   History of hypothyroidism 07/22/2016   History of gastroesophageal reflux (GERD) 07/22/2016   Osteopenia of multiple sites 07/22/2016   History of depression 07/22/2016    Past Medical History:  Diagnosis Date   Abnormal Pap smear 1996   Asthma    Dyspareunia 2002   Endometriosis 2004   H/O amenorrhea 2009   H/O candidiasis    H/O cervicitis 2002   H/O dysmenorrhea 2004   H/O tinea cruris 2002   H/O varicella    H/O vulvovaginitis 2002   H/O: menorrhagia 2003   Hx: UTI (urinary tract infection)    Hypothyroidism    Libido, decreased 2002   Menses, irregular 2004   Ovarian cyst, left 2008  PCB (post coital bleeding) 2003   Pelvic pain in female 2007    Family History  Problem Relation Age of Onset   Asthma Mother    Diabetes Mother    Depression Mother    Asthma Father    Heart disease Brother        Heart Murmur   Arthritis Maternal Grandmother    Cancer Maternal Grandmother        Breast & liver   Depression Maternal Grandmother    Heart disease Maternal Grandfather        MI   Asthma Maternal  Grandfather    Diabetes Maternal Grandfather    Kidney disease Maternal Grandfather        Dialysis   Past Surgical History:  Procedure Laterality Date   BREAST SURGERY     COLONOSCOPY  12/2019   GASTRIC     NOVASURE ABLATION     TUBAL LIGATION     UPPER GI ENDOSCOPY  12/2019   WISDOM TOOTH EXTRACTION  [id number]   Social History   Social History Narrative   Not on file   Immunization History  Administered Date(s) Administered   [employer] Vaccination 09/17/2019, 10/15/2019     Objective: Vital Signs: BP 112/78 (BP Location: Left Arm, Patient Position: Sitting, Cuff Size: Normal)    Pulse 82    Ht _0  (1.549 m)    Wt 195 lb (88.5 kg)    BMI 36.84 kg/m    Physical Exam Vitals and nursing note reviewed.  Constitutional:      Appearance: She is well-developed.  HENT:     Head: Normocephalic and atraumatic.  Eyes:     Conjunctiva/sclera: Conjunctivae normal.  Cardiovascular:     Rate and Rhythm: Normal rate and regular rhythm.     Heart sounds: Normal heart sounds.  Pulmonary:     Effort: Pulmonary effort is normal.     Breath sounds: Normal breath sounds.  Abdominal:     General: Bowel sounds are normal.     Palpations: Abdomen is soft.  Musculoskeletal:     Cervical back: Normal range of motion.  Lymphadenopathy:     Cervical: No cervical adenopathy.  Skin:    General: Skin is warm and dry.     Capillary Refill: Capillary refill takes less than 2 seconds.  Neurological:     Mental Status: She is alert and oriented to person, place, and time.  Psychiatric:        Behavior: Behavior normal.     Musculoskeletal Exam: C-spine, thoracic spine, and lumbar spine good ROM.  Shoulder joints, elbow joints, wrist joints, MCPs, PIPs, and DIPs good ROM with no synovitis.  Complete fist formation bilaterally.  Hip joints, knee joints, and ankle joints have good ROM with no discomfort.  No warmth or effusion of knee joints.  No tenderness or swelling of ankle  joints.   CDAI Exam: CDAI Score: -- Patient Global: --; Provider Global: -- Swollen: --; Tender: -- Joint Exam 02/02/2021   No joint exam has been documented for this visit   There is currently no information documented on the homunculus. Go to the Rheumatology activity and complete the homunculus joint exam.  Investigation: No additional findings.  Imaging: No results found.  Recent Labs: Lab Results  Component Value Date   WBC 6.5 11/02/2020   HGB 13.4 11/02/2020   PLT 242 11/02/2020   NA 137 11/02/2020   K 3.7 11/02/2020   CL 105 11/02/2020   CO2  24 11/02/2020   GLUCOSE 122 (H) 11/02/2020   BUN 21 (H) 11/02/2020   CREATININE 0.63 11/02/2020   BILITOT 0.4 11/02/2020   ALKPHOS 43 11/02/2020   AST 19 11/02/2020   ALT 13 11/02/2020   PROT 7.2 11/02/2020   ALBUMIN 4.0 11/02/2020   CALCIUM 8.3 (L) 11/02/2020   GFRAA 118 03/31/2020    Speciality Comments: PLQ eye exam: 06/25/2019 WNL  [place] Follow up in 12 months.  Appt 11/05/2020 for PLQ eye exam  Procedures:  No procedures performed Allergies: Sulfa drugs cross reactors     Assessment / Plan:     Visit Diagnoses: Autoimmune disease (HCC)positive ANA, positive Ro and positive La antibody. malar rash, history of oral ulcers and sicca symptoms: She has not had any signs or symptoms of an autoimmune disease flare since her last office visit.  She has clinically been doing well taking Plaquenil 200 mg 1 tablet by mouth twice daily Monday through Friday.  She continues to tolerate Plaquenil without any side effects and has not missed any doses recently.  She has no signs of inflammatory arthritis on examination.  No synovitis was noted.  She has occasional discomfort in both knee joints when climbing up and down steps.  She is not experiencing any other joint pain at this time.  She has not had any recent rashes, Raynaud's phenomenon, oral or nasal ulcerations, sicca symptoms, pleuritic chest pain, palpitations,  shortness of breath, cervical lymphadenopathy, or increased fatigue.  She continues to have some ongoing hair thinning but no signs of alopecia were noted. Lab work from 09/01/20 was reviewed today in the office: dsdNA is negative, complements WNL, and ESR.  The following lab work will be updated today.  She will remain on Plaquenil as prescribed.  She was advised to notify us if she develops signs or symptoms of a flare.  She will follow-up in the office in 5 months. - Plan: CBC with Differential/Platelet, COMPLETE METABOLIC PANEL WITH GFR, Urinalysis, Routine w reflex microscopic, ANA, Anti-DNA antibody, double-stranded, C3 and C4, Sedimentation rate  High risk medication use - Plaquenil 200 mg 1 tablet twice daily Monday through Friday.  PLQ eye exam: 06/25/2019 WNL [place] Follow up in 12 months. She had an updated plaquenil eye exam, so we will call to obtain these records. CBC within normal limits on 11/02/2020.  CMP updated on 11/02/2020. CBC and CMP will be updated today to monitor for drug toxicity.- Plan: CBC with Differential/Platelet, COMPLETE METABOLIC PANEL WITH GFR  Primary osteoarthritis of both knees: X-rays of both knee joints on 05/28/19 revealed findings consistent with moderate OA and moderate chondromalacia patella.  She experiences intermittent discomfort in both knee joints, typically exacerbated by climbing up and down steps.  She has not had any mechanical symptoms, nocturnal pain or joint swelling. She has good ROM of both knee joints on examination today.  No warmth or effusion noted. Discussed the importance of lower extremity muscle strengthening.  She was given a handout of knee joint exercises to perform. She was advised to notify us if she develops any new or worsening symptoms.   Osteopenia of multiple sites - DEXA updated on 08/07/2019: LFN BMD 0.735 with T-score -2.2. History of gastric gastrectomy. DEXA due in August 2023. She is taking a calcium and vitamin D  supplement daily.  No recent falls or fractures.   Other medical conditions are listed as follows:   Iron deficiency anemia following bariatric surgery  Primary insomnia -  She is taking ambien 10 mg at bedtime as needed for insomnia.   History of hypothyroidism  History of gastroesophageal reflux (GERD)  History of depression  Orders: Orders Placed This Encounter  Procedures   CBC with Differential/Platelet   COMPLETE METABOLIC PANEL WITH GFR   Urinalysis, Routine w reflex microscopic   ANA   Anti-DNA antibody, double-stranded   C3 and C4   Sedimentation rate   No orders of the defined types were placed in this encounter.    Follow-Up Instructions: Return in 5 months (on 07/02/2021) for Autoimmune Disease, Osteoarthritis.   Ofilia Neas, PA-C  Note - This record has been created using Dragon software.  Chart creation errors have been sought, but may not always  have been located. Such creation errors do not reflect on  the standard of medical care.

## 2021-02-02 ENCOUNTER — Other Ambulatory Visit: Payer: Self-pay

## 2021-02-02 ENCOUNTER — Encounter: Payer: Self-pay | Admitting: Physician Assistant

## 2021-02-02 ENCOUNTER — Ambulatory Visit: Payer: Managed Care, Other (non HMO) | Admitting: Physician Assistant

## 2021-02-02 VITALS — BP 112/78 | HR 82 | Ht 61.0 in | Wt 195.0 lb

## 2021-02-02 DIAGNOSIS — Z8719 Personal history of other diseases of the digestive system: Secondary | ICD-10-CM

## 2021-02-02 DIAGNOSIS — F5101 Primary insomnia: Secondary | ICD-10-CM

## 2021-02-02 DIAGNOSIS — M8589 Other specified disorders of bone density and structure, multiple sites: Secondary | ICD-10-CM

## 2021-02-02 DIAGNOSIS — M170 Bilateral primary osteoarthritis of knee: Secondary | ICD-10-CM | POA: Diagnosis not present

## 2021-02-02 DIAGNOSIS — D508 Other iron deficiency anemias: Secondary | ICD-10-CM

## 2021-02-02 DIAGNOSIS — K9589 Other complications of other bariatric procedure: Secondary | ICD-10-CM

## 2021-02-02 DIAGNOSIS — Z8639 Personal history of other endocrine, nutritional and metabolic disease: Secondary | ICD-10-CM

## 2021-02-02 DIAGNOSIS — M359 Systemic involvement of connective tissue, unspecified: Secondary | ICD-10-CM | POA: Diagnosis not present

## 2021-02-02 DIAGNOSIS — Z8659 Personal history of other mental and behavioral disorders: Secondary | ICD-10-CM

## 2021-02-02 DIAGNOSIS — Z79899 Other long term (current) drug therapy: Secondary | ICD-10-CM

## 2021-02-02 NOTE — Patient Instructions (Signed)
Knee Exercises Ask your health care provider which exercises are safe for you. Do exercises exactly as told by your health care provider and adjust them as directed. It is normal to feel mild stretching, pulling, tightness, or discomfort as you do these exercises. Stop right away if you feel sudden pain or your pain gets worse. Do not begin these exercises until told by your health care provider. Stretching and range-of-motion exercises These exercises warm up your muscles and joints and improve the movement and flexibility of your knee. These exercises also help to relieve pain and swelling. Knee extension, prone  Lie on your abdomen (prone position) on a bed. Place your left / right knee just beyond the edge of the surface so your knee is not on the bed. You can put a towel under your left / right thigh just above your kneecap for comfort. Relax your leg muscles and allow gravity to straighten your knee (extension). You should feel a stretch behind your left / right knee. Hold this position for __________ seconds. Scoot up so your knee is supported between repetitions. Repeat __________ times. Complete this exercise __________ times a day. Knee flexion, active  Lie on your back with both legs straight. If this causes back discomfort, bend your left / right knee so your foot is flat on the floor. Slowly slide your left / right heel back toward your buttocks. Stop when you feel a gentle stretch in the front of your knee or thigh (flexion). Hold this position for __________ seconds. Slowly slide your left / right heel back to the starting position. Repeat __________ times. Complete this exercise __________ times a day. Quadriceps stretch, prone  Lie on your abdomen on a firm surface, such as a bed or padded floor. Bend your left / right knee and hold your ankle. If you cannot reach your ankle or pant leg, loop a belt around your foot and grab the belt instead. Gently pull your heel toward your  buttocks. Your knee should not slide out to the side. You should feel a stretch in the front of your thigh and knee (quadriceps). Hold this position for __________ seconds. Repeat __________ times. Complete this exercise __________ times a day. Hamstring, supine  Lie on your back (supine position). Loop a belt or towel over the ball of your left / right foot. The ball of your foot is on the walking surface, right under your toes. Straighten your left / right knee and slowly pull on the belt to raise your leg until you feel a gentle stretch behind your knee (hamstring). Do not let your knee bend while you do this. Keep your other leg flat on the floor. Hold this position for __________ seconds. Repeat __________ times. Complete this exercise __________ times a day. Strengthening exercises These exercises build strength and endurance in your knee. Endurance is the ability to use your muscles for a long time, even after they get tired. Quadriceps, isometric This exercise strengthens the muscles in front of your thigh (quadriceps) without moving your knee joint (isometric). Lie on your back with your left / right leg extended and your other knee bent. Put a rolled towel or small pillow under your knee if told by your health care provider. Slowly tense the muscles in the front of your left / right thigh. You should see your kneecap slide up toward your hip or see increased dimpling just above the knee. This motion will push the back of the knee toward the floor.   For __________ seconds, hold the muscle as tight as you can without increasing your pain. Relax the muscles slowly and completely. Repeat __________ times. Complete this exercise __________ times a day. Straight leg raises This exercise strengthens the muscles in front of your thigh (quadriceps) and the muscles that move your hips (hip flexors). Lie on your back with your left / right leg extended and your other knee bent. Tense the  muscles in the front of your left / right thigh. You should see your kneecap slide up or see increased dimpling just above the knee. Your thigh may even shake a bit. Keep these muscles tight as you raise your leg 4-6 inches (10-15 cm) off the floor. Do not let your knee bend. Hold this position for __________ seconds. Keep these muscles tense as you lower your leg. Relax your muscles slowly and completely after each repetition. Repeat __________ times. Complete this exercise __________ times a day. Hamstring, isometric  Lie on your back on a firm surface. Bend your left / right knee about __________ degrees. Dig your left / right heel into the surface as if you are trying to pull it toward your buttocks. Tighten the muscles in the back of your thighs (hamstring) to "dig" as hard as you can without increasing any pain. Hold this position for __________ seconds. Release the tension gradually and allow your muscles to relax completely for __________ seconds after each repetition. Repeat __________ times. Complete this exercise __________ times a day. Hamstring curls If told by your health care provider, do this exercise while wearing ankle weights. Begin with __________lb / kg weights. Then increase the weight by 1 lb (0.5 kg) increments. Do not wear ankle weights that are more than __________lb / kg. Lie on your abdomen with your legs straight. Bend your left / right knee as far as you can without feeling pain. Keep your hips flat against the floor. Hold this position for __________ seconds. Slowly lower your leg to the starting position. Repeat __________ times. Complete this exercise __________ times a day. Squats This exercise strengthens the muscles in front of your thigh and knee (quadriceps). Stand in front of a table, with your feet and knees pointing straight ahead. You may rest your hands on the table for balance but not for support. Slowly bend your knees and lower your hips like you  are going to sit in a chair. Keep your weight over your heels, not over your toes. Keep your lower legs upright so they are parallel with the table legs. Do not let your hips go lower than your knees. Do not bend lower than told by your health care provider. If your knee pain increases, do not bend as low. Hold the squat position for __________ seconds. Slowly push with your legs to return to standing. Do not use your hands to pull yourself to standing. Repeat __________ times. Complete this exercise __________ times a day. Wall slides This exercise strengthens the muscles in front of your thigh and knee (quadriceps). Lean your back against a smooth wall or door, and walk your feet out 18-24 inches (46-61 cm) from it. Place your feet hip-width apart. Slowly slide down the wall or door until your knees bend __________ degrees. Keep your knees over your heels, not over your toes. Keep your knees in line with your hips. Hold this position for __________ seconds. Repeat __________ times. Complete this exercise __________ times a day. Straight leg raises, side-lying This exercise strengthens the muscles that rotate   the leg at the hip and move it away from your body (hip abductors). Lie on your side with your left / right leg in the top position. Lie so your head, shoulder, knee, and hip line up. You may bend your bottom knee to help you keep your balance. Roll your hips slightly forward so your hips are stacked directly over each other and your left / right knee is facing forward. Leading with your heel, lift your top leg 4-6 inches (10-15 cm). You should feel the muscles in your outer hip lifting. Do not let your foot drift forward. Do not let your knee roll toward the ceiling. Hold this position for __________ seconds. Slowly return your leg to the starting position. Let your muscles relax completely after each repetition. Repeat __________ times. Complete this exercise __________ times a  day. Straight leg raises, prone This exercise stretches the muscles that move your hips away from the front of the pelvis (hip extensors). Lie on your abdomen on a firm surface. You can put a pillow under your hips if that is more comfortable. Tense the muscles in your buttocks and lift your left / right leg about 4-6 inches (10-15 cm). Keep your knee straight as you lift your leg. Hold this position for __________ seconds. Slowly lower your leg to the starting position. Let your leg relax completely after each repetition. Repeat __________ times. Complete this exercise __________ times a day. This information is not intended to replace advice given to you by your health care provider. Make sure you discuss any questions you have with your health care provider. Document Revised: 09/01/2020 Document Reviewed: 09/01/2020 Elsevier Patient Education  2022 [employer]  

## 2021-02-03 NOTE — Progress Notes (Signed)
Please advise the patient to follow up with PCP regarding UA results.

## 2021-02-03 NOTE — Progress Notes (Signed)
CBC and CMP within normal limits.  ESR and complements within normal limits.  UA revealed trace protein.  Please see a protein creatinine ratio can be added.  UA also revealed 2+ leukocytes and 3-10 RBCs.  Please clarify if she is currently on her menstrual cycle.

## 2021-02-04 NOTE — Addendum Note (Signed)
Addended by: Gearldine Bienenstock on: 02/04/2021 04:12 PM   Modules accepted: Level of Service

## 2021-02-05 LAB — URINALYSIS, ROUTINE W REFLEX MICROSCOPIC
Bacteria, UA: NONE SEEN /HPF
Bilirubin Urine: NEGATIVE
Glucose, UA: NEGATIVE
Hgb urine dipstick: NEGATIVE
Hyaline Cast: NONE SEEN /LPF
Ketones, ur: NEGATIVE
Nitrite: NEGATIVE
Specific Gravity, Urine: 1.028 (ref 1.001–1.035)
pH: 5.5 (ref 5.0–8.0)

## 2021-02-05 LAB — CBC WITH DIFFERENTIAL/PLATELET
Absolute Monocytes: 524 cells/uL (ref 200–950)
Basophils Absolute: 51 cells/uL (ref 0–200)
Basophils Relative: 0.9 %
Eosinophils Absolute: 291 cells/uL (ref 15–500)
Eosinophils Relative: 5.1 %
HCT: 37.1 % (ref 35.0–45.0)
Hemoglobin: 12.2 g/dL (ref 11.7–15.5)
Lymphs Abs: 1556 cells/uL (ref 850–3900)
MCH: 28.8 pg (ref 27.0–33.0)
MCHC: 32.9 g/dL (ref 32.0–36.0)
MCV: 87.7 fL (ref 80.0–100.0)
MPV: 10.3 fL (ref 7.5–12.5)
Monocytes Relative: 9.2 %
Neutro Abs: 3278 cells/uL (ref 1500–7800)
Neutrophils Relative %: 57.5 %
Platelets: 267 10*3/uL (ref 140–400)
RBC: 4.23 10*6/uL (ref 3.80–5.10)
RDW: 12.5 % (ref 11.0–15.0)
Total Lymphocyte: 27.3 %
WBC: 5.7 10*3/uL (ref 3.8–10.8)

## 2021-02-05 LAB — COMPLETE METABOLIC PANEL WITH GFR
AG Ratio: 1.5 (calc) (ref 1.0–2.5)
ALT: 8 U/L (ref 6–29)
AST: 11 U/L (ref 10–35)
Albumin: 4 g/dL (ref 3.6–5.1)
Alkaline phosphatase (APISO): 46 U/L (ref 31–125)
BUN: 23 mg/dL (ref 7–25)
CO2: 29 mmol/L (ref 20–32)
Calcium: 9.1 mg/dL (ref 8.6–10.2)
Chloride: 110 mmol/L (ref 98–110)
Creat: 0.77 mg/dL (ref 0.50–0.99)
Globulin: 2.6 g/dL (calc) (ref 1.9–3.7)
Glucose, Bld: 88 mg/dL (ref 65–99)
Potassium: 4.3 mmol/L (ref 3.5–5.3)
Sodium: 145 mmol/L (ref 135–146)
Total Bilirubin: 0.2 mg/dL (ref 0.2–1.2)
Total Protein: 6.6 g/dL (ref 6.1–8.1)
eGFR: 96 mL/min/{1.73_m2} (ref >=60)

## 2021-02-05 LAB — MICROSCOPIC MESSAGE

## 2021-02-05 LAB — C3 AND C4
C3 Complement: 117 mg/dL (ref 83–193)
C4 Complement: 24 mg/dL (ref 15–57)

## 2021-02-05 LAB — ANTI-NUCLEAR AB-TITER (ANA TITER): ANA Titer 1: 1:40 {titer} — ABNORMAL HIGH

## 2021-02-05 LAB — SEDIMENTATION RATE: Sed Rate: 9 mm/h (ref 0–20)

## 2021-02-05 LAB — ANTI-DNA ANTIBODY, DOUBLE-STRANDED: ds DNA Ab: 1 IU/mL

## 2021-02-05 LAB — ANA: Anti Nuclear Antibody (ANA): POSITIVE — AB

## 2021-02-05 NOTE — Progress Notes (Signed)
dsDNA is negative.  ANA remains borderline positive but is a low titer, nonspecific titer.  No medication changes recommended at this time.

## 2021-03-15 ENCOUNTER — Other Ambulatory Visit: Payer: Self-pay | Admitting: Rheumatology

## 2021-03-15 DIAGNOSIS — M359 Systemic involvement of connective tissue, unspecified: Secondary | ICD-10-CM

## 2021-03-15 NOTE — Telephone Encounter (Signed)
Next Visit: 06/30/2021 ? ?Last Visit: 02/02/2021 ? ?Labs: 02/02/2021 dsDNA is negative.  ANA remains borderline positive but is a low titer, nonspecific titer.  ?No medication changes recommended at this time. CBC and CMP within normal limits.  ESR and complements within normal limits.  UA revealed trace protein. UA also revealed 2+ leukocytes and 3-10 RBCs.  Please clarify if she is currently on her menstrual cycle. ? ?Eye exam: 11/05/2020  ? ?Current Dose per office note on 02/02/2021: Plaquenil 200 mg 1 tablet twice daily Monday through Friday.  ? ?DX: Autoimmune disease  ? ?Last Fill: 12/15/2020 ? ?Okay to refill Plaquenil?  ?

## 2021-04-07 NOTE — Progress Notes (Deleted)
Office Visit Note  Patient: [name]             Date of Birth: Oct 28, 1975           MRN: [id number]             PCP: Devra Dopp, MD Referring: Devra Dopp, MD Visit Date: 04/20/2021 Occupation: @GUAROCC @  Subjective:  No chief complaint on file.   History of Present Illness: [name] is a 46 y.o. female ***   Activities of Daily Living:  Patient reports morning stiffness for *** {minute/hour:19697}.   Patient {ACTIONS;DENIES/REPORTS:21021675::"Denies"} nocturnal pain.  Difficulty dressing/grooming: {ACTIONS;DENIES/REPORTS:21021675::"Denies"} Difficulty climbing stairs: {ACTIONS;DENIES/REPORTS:21021675::"Denies"} Difficulty getting out of chair: {ACTIONS;DENIES/REPORTS:21021675::"Denies"} Difficulty using hands for taps, buttons, cutlery, and/or writing: {ACTIONS;DENIES/REPORTS:21021675::"Denies"}  No Rheumatology ROS completed.   PMFS History:  Patient Active Problem List   Diagnosis Date Noted   Primary insomnia 04/04/2017   Autoimmune disease (HCC)positive ANA, positive Ro and positive La antibody. malar rash, history of oral ulcers and sicca symptoms 07/22/2016   High risk medication use 07/22/2016   History of hypothyroidism 07/22/2016   History of gastroesophageal reflux (GERD) 07/22/2016   Osteopenia of multiple sites 07/22/2016   History of depression 07/22/2016    Past Medical History:  Diagnosis Date   Abnormal Pap smear 1996   Asthma    Dyspareunia 2002   Endometriosis 2004   H/O amenorrhea 2009   H/O candidiasis    H/O cervicitis 2002   H/O dysmenorrhea 2004   H/O tinea cruris 2002   H/O varicella    H/O vulvovaginitis 2002   H/O: menorrhagia 2003   Hx: UTI (urinary tract infection)    Hypothyroidism    Libido, decreased 2002   Menses, irregular 2004   Ovarian cyst, left 2008   PCB (post coital bleeding) 2003   Pelvic pain in female 2007    Family History  Problem Relation Age of Onset   Asthma Mother    Diabetes  Mother    Depression Mother    Asthma Father    Heart disease Brother        Heart Murmur   Arthritis Maternal Grandmother    Cancer Maternal Grandmother        Breast & liver   Depression Maternal Grandmother    Heart disease Maternal Grandfather        MI   Asthma Maternal Grandfather    Diabetes Maternal Grandfather    Kidney disease Maternal Grandfather        Dialysis   Past Surgical History:  Procedure Laterality Date   BREAST SURGERY     COLONOSCOPY  12/2019   GASTRIC     NOVASURE ABLATION     TUBAL LIGATION     UPPER GI ENDOSCOPY  12/2019   WISDOM TOOTH EXTRACTION  01/2020   Social History   Social History Narrative   Not on file   Immunization History  Administered Date(s) Administered   Moderna Sars-Covid-2 Vaccination 09/17/2019, 10/15/2019     Objective: Vital Signs: There were no vitals taken for this visit.   Physical Exam   Musculoskeletal Exam: ***  CDAI Exam: CDAI Score: -- Patient Global: --; Provider Global: -- Swollen: --; Tender: -- Joint Exam 04/20/2021   No joint exam has been documented for this visit   There is currently no information documented on the homunculus. Go to the Rheumatology activity and complete the homunculus joint exam.  Investigation: No additional findings.  Imaging: No results found.  Recent  Labs: Lab Results  Component Value Date   WBC 5.7 02/02/2021   HGB 12.2 02/02/2021   PLT 267 02/02/2021   NA 145 02/02/2021   K 4.3 02/02/2021   CL 110 02/02/2021   CO2 29 02/02/2021   GLUCOSE 88 02/02/2021   BUN 23 02/02/2021   CREATININE 0.77 02/02/2021   BILITOT 0.2 02/02/2021   ALKPHOS 43 11/02/2020   AST 11 02/02/2021   ALT 8 02/02/2021   PROT 6.6 02/02/2021   ALBUMIN 4.0 11/02/2020   CALCIUM 9.1 02/02/2021   GFRAA 118 03/31/2020    Speciality Comments: PLQ eye exam: 11/05/2020 [facility]  Procedures:  No procedures performed Allergies: Sulfa drugs cross reactors   Assessment /  Plan:     Visit Diagnoses: No diagnosis found.  Orders: No orders of the defined types were placed in this encounter.  No orders of the defined types were placed in this encounter.   Face-to-face time spent with patient was *** minutes. Greater than 50% of time was spent in counseling and coordination of care.  Follow-Up Instructions: No follow-ups on file.   Ellen Henri, CMA  Note - This record has been created using Animal nutritionist.  Chart creation errors have been sought, but may not always  have been located. Such creation errors do not reflect on  the standard of medical care.

## 2021-04-14 NOTE — Progress Notes (Signed)
? ?Office Visit Note ? ?Patient: [name]             ?Date of Birth: 1975-12-20           ?MRN: [id number]             ?PCP: Helane Rima, MD ?Referring: Helane Rima, MD ?Visit Date: 04/23/2021 ?Occupation: @GUAROCC @ ? ?Subjective:  ?Right hip pain ? ?History of Present Illness: [name] is a 46 y.o. female with history of autoimmune disease. She is taking plaquenil 200 mg 1 tablet by mouth twice daily Monday through Friday.  She continues to take Plaquenil as prescribed and has not missed any doses recently.  She states that in February 2023 she was diagnosed with COVID-19.  She states that during the infection she experienced increased generalized pain and had to stay in bed for 2 to 3 days due to the body aches and joint pain.  She states that she continues to have residual discomfort in her right thumb, right hip, right knee, and right ankle joint.  She denies any obvious joint swelling at this time.  She states that her pain has been most severe in the right hip and she has tried massage, using lidocaine, and a heating pad with minimal relief.  She states the pain is most severe on the lateral aspect of her right hip especially when walking for prolonged distances. ?She denies any signs or symptoms of an autoimmune disease flare.  She has not had any recent rashes, photosensitivity, Raynaud's phenomenon, oral or nasal ulcerations, sicca symptoms, shortness of breath, or pleuritic chest pain. ? ? ? ?Activities of Daily Living:  ?Patient reports morning stiffness for 5 minutes.   ?Patient Reports nocturnal pain.  ?Difficulty dressing/grooming: Denies ?Difficulty climbing stairs: Reports ?Difficulty getting out of chair: Reports ?Difficulty using hands for taps, buttons, cutlery, and/or writing: Reports ? ?Review of Systems  ?Constitutional:  Positive for fatigue.  ?HENT:  Negative for mouth sores, mouth dryness and nose dryness.   ?Eyes:  Negative for pain, itching and dryness.  ?Respiratory:   Negative for shortness of breath and difficulty breathing.   ?Cardiovascular:  Negative for chest pain and palpitations.  ?Gastrointestinal:  Negative for blood in stool, constipation and diarrhea.  ?Endocrine: Negative for increased urination.  ?Genitourinary:  Negative for difficulty urinating.  ?Musculoskeletal:  Positive for joint pain, joint pain, myalgias, morning stiffness, muscle tenderness and myalgias. Negative for joint swelling.  ?Skin:  Negative for color change, rash and redness.  ?Allergic/Immunologic: Negative for susceptible to infections.  ?Neurological:  Positive for headaches. Negative for dizziness, numbness, memory loss and weakness.  ?Hematological:  Positive for bruising/bleeding tendency.  ?Psychiatric/Behavioral:  Negative for confusion.   ? ?PMFS History:  ?Patient Active Problem List  ? Diagnosis Date Noted  ? Primary insomnia 04/04/2017  ? 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 malar rash, history of oral ulcers and sicca symptoms 07/22/2016  ? High risk medication use 07/22/2016  ? History of hypothyroidism 07/22/2016  ? History of gastroesophageal reflux (GERD) 07/22/2016  ? Osteopenia of multiple sites 07/22/2016  ? History of depression 07/22/2016  ?  ?Past Medical History:  ?Diagnosis Date  ? Abnormal Pap smear 1996  ? Asthma   ? Dyspareunia 2002  ? Endometriosis 2004  ? H/O amenorrhea 2009  ? H/O candidiasis   ? H/O cervicitis 2002  ? H/O dysmenorrhea 2004  ? H/O tinea cruris 2002  ? H/O varicella   ? H/O  vulvovaginitis 2002  ? H/O: menorrhagia 2003  ? Hx: UTI (urinary tract infection)   ? Hypothyroidism   ? Libido, decreased 2002  ? Menses, irregular 2004  ? Ovarian cyst, left 2008  ? PCB (post coital bleeding) 2003  ? Pelvic pain in female 2007  ?  ?Family History  ?Problem Relation Age of Onset  ? Asthma Mother   ? Diabetes Mother   ? Depression Mother   ? Asthma Father   ? Heart disease Brother   ?     Heart Murmur  ? Arthritis Maternal  Grandmother   ? Cancer Maternal Grandmother   ?     Breast & liver  ? Depression Maternal Grandmother   ? Heart disease Maternal Grandfather   ?     MI  ? Asthma Maternal Grandfather   ? Diabetes Maternal Grandfather   ? Kidney disease Maternal Grandfather   ?     Dialysis  ? ?Past Surgical History:  ?Procedure Laterality Date  ? BREAST SURGERY    ? COLONOSCOPY  12/2019  ? GASTRIC    ? NOVASURE ABLATION    ? TUBAL LIGATION    ? UPPER GI ENDOSCOPY  12/2019  ? WISDOM TOOTH EXTRACTION  [id number]  ? ?Social History  ? ?Social History Narrative  ? Not on file  ? ?Immunization History  ?Administered Date(s) Administered  ? Moderna Sars-Covid-2 Vaccination 09/17/2019, 10/15/2019  ?  ? ?Objective: ?Vital Signs: BP 115/80 (BP Location: Left Arm, Patient Position: Sitting, Cuff Size: Normal)   Pulse 78   Ht 5\' 1"  (1.549 m)   Wt 199 lb 6.4 oz (90.4 kg)   BMI 37.68 kg/m?   ? ?Physical Exam ?Vitals and nursing note reviewed.  ?Constitutional:   ?   Appearance: She is well-developed.  ?HENT:  ?   Head: Normocephalic and atraumatic.  ?Eyes:  ?   Conjunctiva/sclera: Conjunctivae normal.  ?Cardiovascular:  ?   Rate and Rhythm: Normal rate and regular rhythm.  ?   Heart sounds: Normal heart sounds.  ?Pulmonary:  ?   Effort: Pulmonary effort is normal.  ?   Breath sounds: Normal breath sounds.  ?Abdominal:  ?   General: Bowel sounds are normal.  ?   Palpations: Abdomen is soft.  ?Musculoskeletal:  ?   Cervical back: Normal range of motion.  ?Skin: ?   General: Skin is warm and dry.  ?   Capillary Refill: Capillary refill takes less than 2 seconds.  ?Neurological:  ?   Mental Status: She is alert and oriented to person, place, and time.  ?Psychiatric:     ?   Behavior: Behavior normal.  ?  ? ?Musculoskeletal Exam: C-spine, thoracic spine, lumbar spine have good range of motion.  No midline spinal tenderness or SI joint tenderness upon palpation.  Shoulder joints, elbow joints, wrist joints, MCPs, PIPs, DIPs have good range of  motion with no synovitis.  Tenderness palpation over the right CMC joint noted.  Complete fist formation bilaterally.  Hip joints have good range of motion with no groin pain.  She has tenderness palpation over the right trochanteric bursa.  Knee joints have good range of motion with no warmth or effusion.  Ankle joints have good range of motion with no joint tenderness or synovitis.  No tenderness over MTP joints. ? ?CDAI Exam: ?CDAI Score: -- ?Patient Global: --; Provider Global: -- ?Swollen: --; Tender: -- ?Joint Exam 04/23/2021  ? ?No joint exam has been documented for this visit  ? ?  There is currently no information documented on the homunculus. Go to the Rheumatology activity and complete the homunculus joint exam. ? ?Investigation: ?No additional findings. ? ?Imaging: ?No results found. ? ?Recent Labs: ?Lab Results  ?Component Value Date  ? WBC 5.7 02/02/2021  ? HGB 12.2 02/02/2021  ? PLT 267 02/02/2021  ? NA 145 02/02/2021  ? K 4.3 02/02/2021  ? CL 110 02/02/2021  ? CO2 29 02/02/2021  ? GLUCOSE 88 02/02/2021  ? BUN 23 02/02/2021  ? CREATININE 0.77 02/02/2021  ? BILITOT 0.2 02/02/2021  ? ALKPHOS 43 11/02/2020  ? AST 11 02/02/2021  ? ALT 8 02/02/2021  ? PROT 6.6 02/02/2021  ? ALBUMIN 4.0 11/02/2020  ? CALCIUM 9.1 02/02/2021  ? GFRAA 118 03/31/2020  ? ? ?Speciality Comments: PLQ eye exam: 11/05/2020 [facility] ? ?Procedures:  ?Large Joint Inj: R greater trochanter on 04/23/2021 9:29 AM ?Indications: pain ?Details: 27 G 1.5 in needle, lateral approach ? ?Arthrogram: No ? ?Medications: 1.5 mL lidocaine 1 %; 40 mg triamcinolone acetonide 40 MG/ML ?Aspirate: 0 mL ?Outcome: tolerated well, no immediate complications ?Procedure, treatment alternatives, risks and benefits explained, specific risks discussed. Consent was given by the patient. Immediately prior to procedure a time out was called to verify the correct patient, procedure, equipment, support staff and site/side marked as required. Patient was  prepped and draped in the usual sterile fashion.  ? ? ?Allergies: Sulfa drugs cross reactors  ? ? ? ?Assessment / Plan:     ?Visit Diagnoses: Autoimmune disease (HCC)positive ANA, positive Ro and positive La antib

## 2021-04-20 ENCOUNTER — Ambulatory Visit: Payer: Managed Care, Other (non HMO) | Admitting: Physician Assistant

## 2021-04-20 DIAGNOSIS — Z79899 Other long term (current) drug therapy: Secondary | ICD-10-CM

## 2021-04-20 DIAGNOSIS — M359 Systemic involvement of connective tissue, unspecified: Secondary | ICD-10-CM

## 2021-04-20 DIAGNOSIS — M170 Bilateral primary osteoarthritis of knee: Secondary | ICD-10-CM

## 2021-04-20 DIAGNOSIS — Z8659 Personal history of other mental and behavioral disorders: Secondary | ICD-10-CM

## 2021-04-20 DIAGNOSIS — Z8639 Personal history of other endocrine, nutritional and metabolic disease: Secondary | ICD-10-CM

## 2021-04-20 DIAGNOSIS — M8589 Other specified disorders of bone density and structure, multiple sites: Secondary | ICD-10-CM

## 2021-04-20 DIAGNOSIS — Z8719 Personal history of other diseases of the digestive system: Secondary | ICD-10-CM

## 2021-04-20 DIAGNOSIS — F5101 Primary insomnia: Secondary | ICD-10-CM

## 2021-04-20 DIAGNOSIS — D508 Other iron deficiency anemias: Secondary | ICD-10-CM

## 2021-04-23 ENCOUNTER — Encounter: Payer: Self-pay | Admitting: Physician Assistant

## 2021-04-23 ENCOUNTER — Ambulatory Visit: Payer: Managed Care, Other (non HMO) | Admitting: Physician Assistant

## 2021-04-23 VITALS — BP 115/80 | HR 78 | Ht 61.0 in | Wt 199.4 lb

## 2021-04-23 DIAGNOSIS — Z79899 Other long term (current) drug therapy: Secondary | ICD-10-CM

## 2021-04-23 DIAGNOSIS — M8589 Other specified disorders of bone density and structure, multiple sites: Secondary | ICD-10-CM

## 2021-04-23 DIAGNOSIS — M7061 Trochanteric bursitis, right hip: Secondary | ICD-10-CM

## 2021-04-23 DIAGNOSIS — Z8659 Personal history of other mental and behavioral disorders: Secondary | ICD-10-CM

## 2021-04-23 DIAGNOSIS — Z8719 Personal history of other diseases of the digestive system: Secondary | ICD-10-CM

## 2021-04-23 DIAGNOSIS — M359 Systemic involvement of connective tissue, unspecified: Secondary | ICD-10-CM

## 2021-04-23 DIAGNOSIS — F5101 Primary insomnia: Secondary | ICD-10-CM

## 2021-04-23 DIAGNOSIS — M170 Bilateral primary osteoarthritis of knee: Secondary | ICD-10-CM

## 2021-04-23 DIAGNOSIS — Z8639 Personal history of other endocrine, nutritional and metabolic disease: Secondary | ICD-10-CM

## 2021-04-23 DIAGNOSIS — D508 Other iron deficiency anemias: Secondary | ICD-10-CM

## 2021-04-23 DIAGNOSIS — K9589 Other complications of other bariatric procedure: Secondary | ICD-10-CM

## 2021-04-23 MED ORDER — TRIAMCINOLONE ACETONIDE 40 MG/ML IJ SUSP
40.0000 mg | INTRAMUSCULAR | Status: AC | PRN
Start: 2021-04-23 — End: 2021-04-23
  Administered 2021-04-23: 40 mg via INTRA_ARTICULAR

## 2021-04-23 MED ORDER — LIDOCAINE HCL 1 % IJ SOLN
1.5000 mL | INTRAMUSCULAR | Status: AC | PRN
  Administered 2021-04-23: 1.5 mL

## 2021-05-04 DIAGNOSIS — M7061 Trochanteric bursitis, right hip: Secondary | ICD-10-CM

## 2021-05-04 NOTE — Telephone Encounter (Signed)
Please clarify if she would like a referral to orthopedics for further evaluation.

## 2021-05-07 ENCOUNTER — Encounter: Payer: Self-pay | Admitting: Orthopaedic Surgery

## 2021-05-07 ENCOUNTER — Ambulatory Visit (INDEPENDENT_AMBULATORY_CARE_PROVIDER_SITE_OTHER): Payer: Managed Care, Other (non HMO)

## 2021-05-07 ENCOUNTER — Ambulatory Visit: Payer: Managed Care, Other (non HMO) | Admitting: Orthopaedic Surgery

## 2021-05-07 VITALS — Ht 61.0 in | Wt 190.0 lb

## 2021-05-07 DIAGNOSIS — M25551 Pain in right hip: Secondary | ICD-10-CM

## 2021-05-07 MED ORDER — TRAMADOL HCL 50 MG PO TABS: 50.0000 mg | ORAL_TABLET | Freq: Two times a day (BID) | ORAL | 2 refills | Status: DC | PRN

## 2021-05-07 NOTE — Progress Notes (Signed)
? ?Office Visit Note ?  ?Patient: [name]           ?Date of Birth: 23-May-1975           ?MRN: [id number] ?Visit Date: 05/07/2021 ?             ?Requested by: Gearldine Bienenstock, PA-C ?[address] ?[address] ?[place],  [place] [address] ?PCP: Devra Dopp, MD ? ? ?Assessment & Plan: ?Visit Diagnoses:  ?1. Pain in right hip   ? ? ?Plan: Impression is right hip early arthritis.  Based on the patient's symptoms and clinical exam, I feel this is all coming from her hip.  I would like to refer her to Dr. Alvester Morin for diagnostic and hopefully therapeutic intra-articular cortisone injection.  If she does not get relief from the injection she will follow back up with [place] we will look into her back.  I sent in tramadol to take in the meantime.  Call with concerns or questions. ? ?Follow-Up Instructions: Return if symptoms worsen or fail to improve.  ? ?Orders:  ?Orders Placed This Encounter  ?Procedures  ? XR HIP UNILAT W OR W/O PELVIS 2-3 VIEWS RIGHT  ? ?Meds ordered this encounter  ?Medications  ? traMADol (ULTRAM) 50 MG tablet  ?  Sig: Take 1 tablet (50 mg total) by mouth every 12 (twelve) hours as needed.  ?  Dispense:  30 tablet  ?  Refill:  2  ? ? ? ? Procedures: ?No procedures performed ? ? ?Clinical Data: ?No additional findings. ? ? ?Subjective: ?Chief Complaint  ?Patient presents with  ? Right Hip - Pain  ? ? ?HPI patient is a pleasant 46 year old female who comes in today with right hip pain for the past 3 months.  No known injury or change in activity.  The pain does not improve but has not worsened.  She complains primarily to pain in the groin.  Pain is worse with walking as well as lying on the right side.  He has been taking her husband's pain medicine which does seem to help.  She denies any paresthesias to the right lower extremity. ? ?Review of Systems as detailed in HPI.  All others reviewed and are negative. ? ? ?Objective: ?Vital Signs: Ht 5\' 1"  (1.549 m)   Wt 190 lb (86.2 kg)   BMI 35.90 kg/m?   ? ?Physical Exam well-developed well-nourished female no acute distress but alert and oriented x3. ? ?Ortho Exam right hip exam reveals a positive logroll, positive FADIR and positive [place].  Negative straight leg raise.  Mild tenderness to the greater trochanter.  She is neurovascular intact distally. ? ?Specialty Comments:  ?No specialty comments available. ? ?Imaging: ?XR HIP UNILAT W OR W/O PELVIS 2-3 VIEWS RIGHT ? ?Result Date: 05/07/2021 ?Mild degenerative changes to the right hip joint  ? ? ?PMFS History: ?Patient Active Problem List  ? Diagnosis Date Noted  ? Primary insomnia 04/04/2017  ? 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 malar rash, history of oral ulcers and sicca symptoms 07/22/2016  ? High risk medication use 07/22/2016  ? History of hypothyroidism 07/22/2016  ? History of gastroesophageal reflux (GERD) 07/22/2016  ? Osteopenia of multiple sites 07/22/2016  ? History of depression 07/22/2016  ? ?Past Medical History:  ?Diagnosis Date  ? Abnormal Pap smear 1996  ? Asthma   ? Dyspareunia 2002  ? Endometriosis 2004  ? H/O amenorrhea 2009  ? H/O candidiasis   ? H/O  cervicitis 2002  ? H/O dysmenorrhea 2004  ? H/O tinea cruris 2002  ? H/O varicella   ? H/O vulvovaginitis 2002  ? H/O: menorrhagia 2003  ? Hx: UTI (urinary tract infection)   ? Hypothyroidism   ? Libido, decreased 2002  ? Menses, irregular 2004  ? Ovarian cyst, left 2008  ? PCB (post coital bleeding) 2003  ? Pelvic pain in female 2007  ?  ?Family History  ?Problem Relation Age of Onset  ? Asthma Mother   ? Diabetes Mother   ? Depression Mother   ? Asthma Father   ? Heart disease Brother   ?     Heart Murmur  ? Arthritis Maternal Grandmother   ? Cancer Maternal Grandmother   ?     Breast & liver  ? Depression Maternal Grandmother   ? Heart disease Maternal Grandfather   ?     MI  ? Asthma Maternal Grandfather   ? Diabetes Maternal Grandfather   ? Kidney disease Maternal Grandfather   ?     Dialysis  ?   ?Past Surgical History:  ?Procedure Laterality Date  ? BREAST SURGERY    ? COLONOSCOPY  12/2019  ? GASTRIC    ? NOVASURE ABLATION    ? TUBAL LIGATION    ? UPPER GI ENDOSCOPY  12/2019  ? WISDOM TOOTH EXTRACTION  [id number]  ? ?Social History  ? ?Occupational History  ? Not on file  ?Tobacco Use  ? Smoking status: Never  ?  Passive exposure: Current  ? Smokeless tobacco: Never  ?Vaping Use  ? Vaping Use: Never used  ?Substance and Sexual Activity  ? Alcohol use: Yes  ?  Comment: occ  ? Drug use: No  ? Sexual activity: Yes  ?  Birth control/protection: Surgical  ?  Comment: BTL  ? ? ? ? ? ? ?

## 2021-05-10 ENCOUNTER — Other Ambulatory Visit: Payer: Self-pay

## 2021-05-10 DIAGNOSIS — M25551 Pain in right hip: Secondary | ICD-10-CM

## 2021-05-14 ENCOUNTER — Ambulatory Visit: Payer: Self-pay

## 2021-05-14 ENCOUNTER — Ambulatory Visit (INDEPENDENT_AMBULATORY_CARE_PROVIDER_SITE_OTHER): Payer: Managed Care, Other (non HMO) | Admitting: Physical Medicine and Rehabilitation

## 2021-05-14 DIAGNOSIS — M25551 Pain in right hip: Secondary | ICD-10-CM | POA: Diagnosis not present

## 2021-05-17 MED ORDER — TRIAMCINOLONE ACETONIDE 40 MG/ML IJ SUSP
60.0000 mg | INTRAMUSCULAR | Status: AC | PRN
  Administered 2021-05-14: 60 mg via INTRA_ARTICULAR

## 2021-05-17 MED ORDER — BUPIVACAINE HCL 0.25 % IJ SOLN
4.0000 mL | INTRAMUSCULAR | Status: AC | PRN
  Administered 2021-05-14: 4 mL via INTRA_ARTICULAR

## 2021-05-17 NOTE — Progress Notes (Signed)
? ?  [name] - 46 y.o. female MRN [id number]  Date of birth: 1975-03-22 ? ?Office Visit Note: ?Visit Date: 05/14/2021 ?PCP: Devra Dopp, MD ?Referred by: Devra Dopp, MD ? ?Subjective: ?Chief Complaint  ?Patient presents with  ? Right Hip - Pain  ? ?HPI:  [name] is a 46 y.o. female who comes in today at the request of Dr. Glee Arvin for planned Right anesthetic hip arthrogram with fluoroscopic guidance.  The patient has failed conservative care including home exercise, medications, time and activity modification.  This injection will be diagnostic and hopefully therapeutic.  Please see requesting physician notes for further details and justification. ? ?ROS Otherwise per HPI. ? ?Assessment & Plan: ?Visit Diagnoses:  ?  ICD-10-CM   ?1. Pain in right hip  M25.551 XR C-ARM NO REPORT  ?  ?  ?Plan: No additional findings.  ? ?Meds & Orders: No orders of the defined types were placed in this encounter. ?  ?Orders Placed This Encounter  ?Procedures  ? Large Joint Inj  ? XR C-ARM NO REPORT  ?  ?Follow-up: Return for visit to requesting provider as needed.  ? ?Procedures: ?Large Joint Inj: R hip joint on 05/14/2021 10:30 AM ?Indications: diagnostic evaluation and pain ?Details: 22 G 3.5 in needle, fluoroscopy-guided anterior approach ? ?Arthrogram: No ? ?Medications: 4 mL bupivacaine 0.25 %; 60 mg triamcinolone acetonide 40 MG/ML ?Outcome: tolerated well, no immediate complications ? ?There was excellent flow of contrast producing a partial arthrogram of the hip. The patient did have relief of symptoms during the anesthetic phase of the injection. ?Procedure, treatment alternatives, risks and benefits explained, specific risks discussed. Consent was given by the patient. Immediately prior to procedure a time out was called to verify the correct patient, procedure, equipment, support staff and site/side marked as required. Patient was prepped and draped in the usual sterile fashion.  ? ?  ?   ? ?Clinical  History: ?No specialty comments available.  ? ? ? ?Objective:  VS:  HT:    WT:   BMI:     BP:   HR: bpm  TEMP: ( )  RESP:  ?Physical Exam  ? ?Imaging: ?No results found. ?

## 2021-05-20 ENCOUNTER — Other Ambulatory Visit: Payer: Self-pay | Admitting: Physician Assistant

## 2021-05-20 DIAGNOSIS — M359 Systemic involvement of connective tissue, unspecified: Secondary | ICD-10-CM

## 2021-05-20 NOTE — Telephone Encounter (Signed)
Next Visit: 06/30/2021  Last Visit: 04/23/2021  Labs: 02/02/2021 CBC and CMP within normal limits.  Eye exam: 11/05/2020 [facility]NL    Current Dose per office note 04/23/2021: Plaquenil 200 mg 1 tablet twice daily Monday through Friday.    [id number]   Last Fill: 03/15/2021  Okay to refill Plaquenil?

## 2021-06-18 NOTE — Progress Notes (Signed)
Office Visit Note  Patient: [name]             Date of Birth: [birth year]           MRN: [id number]             PCP: Devra Dopp, MD Referring: Devra Dopp, MD Visit Date: 06/30/2021 Occupation: @GUAROCC @  Subjective:  Joint Pain   History of Present Illness: [name] is a 46 y.o. female with history of autoimmune disease and osteoarthritis.  She states she continues to have discomfort in her right trochanteric bursae intermittently and bilateral knee joints.  She has not noticed any joint swelling.  She denies any history of oral ulcers, nasal ulcers, malar rash, photosensitivity, Raynaud's phenomenon or lymphadenopathy.  She has dry mouth but denies any history of dry eyes.  She has been taking iron supplement for iron deficiency anemia.  She continues to have fatigue and insomnia.  She has been taking hydroxychloroquine 200 mg p.o. twice daily Monday to Friday without any side effects.  Activities of Daily Living:  Patient reports morning stiffness for a couple  minutes.   Patient Denies nocturnal pain.  Difficulty dressing/grooming: Denies Difficulty climbing stairs: Reports Difficulty getting out of chair: Denies Difficulty using hands for taps, buttons, cutlery, and/or writing: Denies  Review of Systems  Constitutional:  Positive for fatigue and night sweats.  HENT:  Negative for mouth sores, mouth dryness and nose dryness.   Eyes: Negative.  Negative for dryness.  Respiratory: Negative.  Negative for cough and shortness of breath.   Cardiovascular: Negative.  Negative for chest pain and palpitations.  Gastrointestinal:  Positive for heartburn. Negative for abdominal pain, constipation and diarrhea.  Endocrine: Negative.  Negative for increased urination.  Genitourinary:  Negative for difficulty urinating and pelvic pain.  Musculoskeletal:  Positive for joint pain, joint pain and morning stiffness.  Skin:  Positive for sensitivity to sunlight. Negative  for color change, rash and hair loss.  Allergic/Immunologic: Negative.  Negative for susceptible to infections.  Neurological:  Positive for headaches and night sweats. Negative for dizziness, fainting and weakness.  Hematological: Negative.  Negative for swollen glands.  Psychiatric/Behavioral: Negative.  Negative for depressed mood and sleep disturbance. The patient is not nervous/anxious.     PMFS History:  Patient Active Problem List   Diagnosis Date Noted   Primary insomnia 04/04/2017   Autoimmune disease (HCC)positive ANA, positive Ro and positive La antibody. malar rash, history of oral ulcers and sicca symptoms 07/22/2016   High risk medication use 07/22/2016   History of hypothyroidism 07/22/2016   History of gastroesophageal reflux (GERD) 07/22/2016   Osteopenia of multiple sites 07/22/2016   History of depression 07/22/2016    Past Medical History:  Diagnosis Date   Abnormal Pap smear 1996   Asthma    Dyspareunia 2002   Endometriosis 2004   H/O amenorrhea 2009   H/O candidiasis    H/O cervicitis 2002   H/O dysmenorrhea 2004   H/O tinea cruris 2002   H/O varicella    H/O vulvovaginitis 2002   H/O: menorrhagia 2003   Hx: UTI (urinary tract infection)    Hypothyroidism    Libido, decreased 2002   Menses, irregular 2004   Ovarian cyst, left 2008   PCB (post coital bleeding) 2003   Pelvic pain in female 2007    Family History  Problem Relation Age of Onset   Asthma Mother    Diabetes Mother    Depression Mother  Asthma Father    Heart disease Brother        Heart Murmur   Arthritis Maternal Grandmother    Cancer Maternal Grandmother        Breast & liver   Depression Maternal Grandmother    Heart disease Maternal Grandfather        MI   Asthma Maternal Grandfather    Diabetes Maternal Grandfather    Kidney disease Maternal Grandfather        Dialysis   Past Surgical History:  Procedure Laterality Date   BREAST SURGERY     COLONOSCOPY  12/2019    GASTRIC     NOVASURE ABLATION     TUBAL LIGATION     UPPER GI ENDOSCOPY  12/2019   WISDOM TOOTH EXTRACTION  [id number]   Social History   Social History Narrative   Not on file   Immunization History  Administered Date(s) Administered   [employer] Vaccination 09/17/2019, 10/15/2019     Objective: Vital Signs: BP 102/70   Pulse 71   Ht 5\' 1"  (1.549 m)   Wt 195 lb (88.5 kg)   BMI 36.84 kg/m    Physical Exam Vitals and nursing note reviewed.  Constitutional:      Appearance: She is well-developed.  HENT:     Head: Normocephalic and atraumatic.  Eyes:     Conjunctiva/sclera: Conjunctivae normal.  Cardiovascular:     Rate and Rhythm: Normal rate and regular rhythm.     Heart sounds: Normal heart sounds.  Pulmonary:     Effort: Pulmonary effort is normal.     Breath sounds: Normal breath sounds.  Abdominal:     General: Bowel sounds are normal.     Palpations: Abdomen is soft.  Musculoskeletal:     Cervical back: Normal range of motion.  Lymphadenopathy:     Cervical: No cervical adenopathy.  Skin:    General: Skin is warm and dry.     Capillary Refill: Capillary refill takes less than 2 seconds.  Neurological:     Mental Status: She is alert and oriented to person, place, and time.  Psychiatric:        Behavior: Behavior normal.      Musculoskeletal Exam: C-spine thoracic and lumbar spine were in good range of motion.  Shoulder joints, elbow joints, wrist joints, MCPs PIPs and DIPs with good range of motion with no synovitis.  Hip joints in good range of motion.  She has some tenderness over right trochanteric bursa.  She had good range of motion of bilateral knee joints without any warmth swelling or effusion.  There was no tenderness over ankles or MTPs.  CDAI Exam: CDAI Score: -- Patient Global: --; Provider Global: -- Swollen: --; Tender: -- Joint Exam 06/30/2021   No joint exam has been documented for this visit   There is currently no  information documented on the homunculus. Go to the Rheumatology activity and complete the homunculus joint exam.  Investigation: No additional findings.  Imaging: No results found.  Recent Labs: Lab Results  Component Value Date   WBC 5.7 02/02/2021   HGB 12.2 02/02/2021   PLT 267 02/02/2021   NA 145 02/02/2021   K 4.3 02/02/2021   CL 110 02/02/2021   CO2 29 02/02/2021   GLUCOSE 88 02/02/2021   BUN 23 02/02/2021   CREATININE 0.77 02/02/2021   BILITOT 0.2 02/02/2021   ALKPHOS 43 11/02/2020   AST 11 02/02/2021   ALT 8 02/02/2021   PROT  6.6 02/02/2021   ALBUMIN 4.0 11/02/2020   CALCIUM 9.1 02/02/2021   GFRAA 118 03/31/2020    Speciality Comments: PLQ eye exam: 11/05/2020 [facility]  Procedures:  No procedures performed Allergies: Sulfa drugs cross reactors   Assessment / Plan:     Visit Diagnoses: Autoimmune disease (HCC)positive ANA, positive Ro and positive La antibody. malar rash, history of oral ulcers and sicca symptoms -she denies any history of malar rash, photosensitivity, Raynaud's phenomenon, lymphadenopathy or inflammatory arthritis.  She has had no recent problems with oral ulcers or nasal ulcers.  She continues to have dry mouth.  Over-the-counter products were discussed.  We will obtain labs today.  Plan: Protein / creatinine ratio, urine, Anti-DNA antibody, double-stranded, C3 and C4, Sedimentation rate  High risk medication use - Plaquenil 200 mg 1 tablet twice daily Monday through Friday.  PLQ eye exam: 11/05/2020 [facility].  - Plan: CBC with Differential/Platelet, COMPLETE METABOLIC PANEL WITH GFR today.  Information regarding immunization was placed in the AVS.  Trochanteric bursitis, right hip-IT band stretches were discussed.  Primary osteoarthritis of both knees-she continues to have some discomfort in her knee joints.  No warmth swelling or effusion was noted.  A handout on lower extremity muscle strengthening exercises was  given.  Osteopenia of multiple sites - DEXA updated on 08/07/2019: LFN BMD 0.735 with T-score -2.2. History of gastric gastrectomy.  No falls or fractures.  Use of calcium rich diet and vitamin D was discussed.  Need for regular exercise was emphasized.  She will have repeat bone density this year.  Iron deficiency anemia following bariatric surgery-she is on iron supplement.  Primary insomnia  Other fatigue -she continues to have fatigue which may be related to insomnia.  Plan: Serum protein electrophoresis with reflex  History of hypothyroidism  History of gastroesophageal reflux (GERD)  History of depression  Orders: Orders Placed This Encounter  Procedures   Protein / creatinine ratio, urine   Anti-DNA antibody, double-stranded   C3 and C4   Sedimentation rate   CBC with Differential/Platelet   COMPLETE METABOLIC PANEL WITH GFR   Serum protein electrophoresis with reflex   No orders of the defined types were placed in this encounter.    Follow-Up Instructions: Return in about 5 months (around 11/30/2021) for Autoimmune disease, Osteoarthritis.   Pollyann Savoy, MD  Note - This record has been created using Animal nutritionist.  Chart creation errors have been sought, but may not always  have been located. Such creation errors do not reflect on  the standard of medical care.

## 2021-06-30 ENCOUNTER — Ambulatory Visit: Payer: Managed Care, Other (non HMO) | Admitting: Rheumatology

## 2021-06-30 ENCOUNTER — Encounter: Payer: Self-pay | Admitting: Rheumatology

## 2021-06-30 VITALS — BP 102/70 | HR 71 | Ht 61.0 in | Wt 195.0 lb

## 2021-06-30 DIAGNOSIS — M7061 Trochanteric bursitis, right hip: Secondary | ICD-10-CM | POA: Diagnosis not present

## 2021-06-30 DIAGNOSIS — Z8719 Personal history of other diseases of the digestive system: Secondary | ICD-10-CM

## 2021-06-30 DIAGNOSIS — Z8659 Personal history of other mental and behavioral disorders: Secondary | ICD-10-CM

## 2021-06-30 DIAGNOSIS — D508 Other iron deficiency anemias: Secondary | ICD-10-CM

## 2021-06-30 DIAGNOSIS — R5383 Other fatigue: Secondary | ICD-10-CM

## 2021-06-30 DIAGNOSIS — F5101 Primary insomnia: Secondary | ICD-10-CM

## 2021-06-30 DIAGNOSIS — M8589 Other specified disorders of bone density and structure, multiple sites: Secondary | ICD-10-CM

## 2021-06-30 DIAGNOSIS — Z79899 Other long term (current) drug therapy: Secondary | ICD-10-CM | POA: Diagnosis not present

## 2021-06-30 DIAGNOSIS — M170 Bilateral primary osteoarthritis of knee: Secondary | ICD-10-CM

## 2021-06-30 DIAGNOSIS — M359 Systemic involvement of connective tissue, unspecified: Secondary | ICD-10-CM

## 2021-06-30 DIAGNOSIS — Z8639 Personal history of other endocrine, nutritional and metabolic disease: Secondary | ICD-10-CM

## 2021-06-30 DIAGNOSIS — M722 Plantar fascial fibromatosis: Secondary | ICD-10-CM

## 2021-06-30 DIAGNOSIS — K9589 Other complications of other bariatric procedure: Secondary | ICD-10-CM

## 2021-06-30 NOTE — Patient Instructions (Addendum)
Vaccines You are taking a medication(s) that can suppress your immune system.  The following immunizations are recommended: Flu annually Covid-19  Td/Tdap (tetanus, diphtheria, pertussis) every 10 years Pneumonia (Prevnar 15 then Pneumovax 23 at least 1 year apart.  Alternatively, can take Prevnar 20 without needing additional dose) Shingrix: 2 doses from 4 weeks to 6 months apart  Please check with your PCP to make sure you are up to date.   Knee Exercises Ask your health care provider which exercises are safe for you. Do exercises exactly as told by your health care provider and adjust them as directed. It is normal to feel mild stretching, pulling, tightness, or discomfort as you do these exercises. Stop right away if you feel sudden pain or your pain gets worse. Do not begin these exercises until told by your health care provider. Stretching and range-of-motion exercises These exercises warm up your muscles and joints and improve the movement and flexibility of your knee. These exercises also help to relieve pain and swelling. Knee extension, prone  Lie on your abdomen (prone position) on a bed. Place your left / right knee just beyond the edge of the surface so your knee is not on the bed. You can put a towel under your left / right thigh just above your kneecap for comfort. Relax your leg muscles and allow gravity to straighten your knee (extension). You should feel a stretch behind your left / right knee. Hold this position for __________ seconds. Scoot up so your knee is supported between repetitions. Repeat __________ times. Complete this exercise __________ times a day. Knee flexion, active  Lie on your back with both legs straight. If this causes back discomfort, bend your left / right knee so your foot is flat on the floor. Slowly slide your left / right heel back toward your buttocks. Stop when you feel a gentle stretch in the front of your knee or thigh (flexion). Hold this  position for __________ seconds. Slowly slide your left / right heel back to the starting position. Repeat __________ times. Complete this exercise __________ times a day. Quadriceps stretch, prone  Lie on your abdomen on a firm surface, such as a bed or padded floor. Bend your left / right knee and hold your ankle. If you cannot reach your ankle or pant leg, loop a belt around your foot and grab the belt instead. Gently pull your heel toward your buttocks. Your knee should not slide out to the side. You should feel a stretch in the front of your thigh and knee (quadriceps). Hold this position for __________ seconds. Repeat __________ times. Complete this exercise __________ times a day. Hamstring, supine  Lie on your back (supine position). Loop a belt or towel over the ball of your left / right foot. The ball of your foot is on the walking surface, right under your toes. Straighten your left / right knee and slowly pull on the belt to raise your leg until you feel a gentle stretch behind your knee (hamstring). Do not let your knee bend while you do this. Keep your other leg flat on the floor. Hold this position for __________ seconds. Repeat __________ times. Complete this exercise __________ times a day. Strengthening exercises These exercises build strength and endurance in your knee. Endurance is the ability to use your muscles for a long time, even after they get tired. Quadriceps, isometric This exercise strengthens the muscles in front of your thigh (quadriceps) without moving your knee joint (isometric).  Lie on your back with your left / right leg extended and your other knee bent. Put a rolled towel or small pillow under your knee if told by your health care provider. Slowly tense the muscles in the front of your left / right thigh. You should see your kneecap slide up toward your hip or see increased dimpling just above the knee. This motion will push the back of the knee toward  the floor. For __________ seconds, hold the muscle as tight as you can without increasing your pain. Relax the muscles slowly and completely. Repeat __________ times. Complete this exercise __________ times a day. Straight leg raises This exercise strengthens the muscles in front of your thigh (quadriceps) and the muscles that move your hips (hip flexors). Lie on your back with your left / right leg extended and your other knee bent. Tense the muscles in the front of your left / right thigh. You should see your kneecap slide up or see increased dimpling just above the knee. Your thigh may even shake a bit. Keep these muscles tight as you raise your leg 4-6 inches (10-15 cm) off the floor. Do not let your knee bend. Hold this position for __________ seconds. Keep these muscles tense as you lower your leg. Relax your muscles slowly and completely after each repetition. Repeat __________ times. Complete this exercise __________ times a day. Hamstring, isometric  Lie on your back on a firm surface. Bend your left / right knee about __________ degrees. Dig your left / right heel into the surface as if you are trying to pull it toward your buttocks. Tighten the muscles in the back of your thighs (hamstring) to "dig" as hard as you can without increasing any pain. Hold this position for __________ seconds. Release the tension gradually and allow your muscles to relax completely for __________ seconds after each repetition. Repeat __________ times. Complete this exercise __________ times a day. Hamstring curls If told by your health care provider, do this exercise while wearing ankle weights. Begin with __________lb / kg weights. Then increase the weight by 1 lb (0.5 kg) increments. Do not wear ankle weights that are more than __________lb / kg. Lie on your abdomen with your legs straight. Bend your left / right knee as far as you can without feeling pain. Keep your hips flat against the floor. Hold  this position for __________ seconds. Slowly lower your leg to the starting position. Repeat __________ times. Complete this exercise __________ times a day. Squats This exercise strengthens the muscles in front of your thigh and knee (quadriceps). Stand in front of a table, with your feet and knees pointing straight ahead. You may rest your hands on the table for balance but not for support. Slowly bend your knees and lower your hips like you are going to sit in a chair. Keep your weight over your heels, not over your toes. Keep your lower legs upright so they are parallel with the table legs. Do not let your hips go lower than your knees. Do not bend lower than told by your health care provider. If your knee pain increases, do not bend as low. Hold the squat position for __________ seconds. Slowly push with your legs to return to standing. Do not use your hands to pull yourself to standing. Repeat __________ times. Complete this exercise __________ times a day. Wall slides This exercise strengthens the muscles in front of your thigh and knee (quadriceps). Lean your back against a smooth wall  or door, and walk your feet out 18-24 inches (46-61 cm) from it. Place your feet hip-width apart. Slowly slide down the wall or door until your knees bend __________ degrees. Keep your knees over your heels, not over your toes. Keep your knees in line with your hips. Hold this position for __________ seconds. Repeat __________ times. Complete this exercise __________ times a day. Straight leg raises, side-lying This exercise strengthens the muscles that rotate the leg at the hip and move it away from your body (hip abductors). Lie on your side with your left / right leg in the top position. Lie so your head, shoulder, knee, and hip line up. You may bend your bottom knee to help you keep your balance. Roll your hips slightly forward so your hips are stacked directly over each other and your left / right  knee is facing forward. Leading with your heel, lift your top leg 4-6 inches (10-15 cm). You should feel the muscles in your outer hip lifting. Do not let your foot drift forward. Do not let your knee roll toward the ceiling. Hold this position for __________ seconds. Slowly return your leg to the starting position. Let your muscles relax completely after each repetition. Repeat __________ times. Complete this exercise __________ times a day. Straight leg raises, prone This exercise stretches the muscles that move your hips away from the front of the pelvis (hip extensors). Lie on your abdomen on a firm surface. You can put a pillow under your hips if that is more comfortable. Tense the muscles in your buttocks and lift your left / right leg about 4-6 inches (10-15 cm). Keep your knee straight as you lift your leg. Hold this position for __________ seconds. Slowly lower your leg to the starting position. Let your leg relax completely after each repetition. Repeat __________ times. Complete this exercise __________ times a day. This information is not intended to replace advice given to you by your health care provider. Make sure you discuss any questions you have with your health care provider. Document Revised: 09/01/2020 Document Reviewed: 09/01/2020 Elsevier Patient Education  2023 [employer].

## 2021-07-02 LAB — PROTEIN ELECTROPHORESIS, SERUM, WITH REFLEX
Albumin ELP: 3.9 g/dL (ref 3.8–4.8)
Alpha 1: 0.3 g/dL (ref 0.2–0.3)
Alpha 2: 0.7 g/dL (ref 0.5–0.9)
Beta 2: 0.5 g/dL (ref 0.2–0.5)
Beta Globulin: 0.4 g/dL (ref 0.4–0.6)
Gamma Globulin: 1 g/dL (ref 0.8–1.7)
Total Protein: 6.8 g/dL (ref 6.1–8.1)

## 2021-07-02 LAB — COMPLETE METABOLIC PANEL WITH GFR
AG Ratio: 1.7 (calc) (ref 1.0–2.5)
ALT: 9 U/L (ref 6–29)
AST: 11 U/L (ref 10–35)
Albumin: 4 g/dL (ref 3.6–5.1)
Alkaline phosphatase (APISO): 51 U/L (ref 31–125)
BUN: 23 mg/dL (ref 7–25)
CO2: 29 mmol/L (ref 20–32)
Calcium: 9.1 mg/dL (ref 8.6–10.2)
Chloride: 107 mmol/L (ref 98–110)
Creat: 0.66 mg/dL (ref 0.50–0.99)
Globulin: 2.4 g/dL (calc) (ref 1.9–3.7)
Glucose, Bld: 89 mg/dL (ref 65–99)
Potassium: 4.4 mmol/L (ref 3.5–5.3)
Sodium: 141 mmol/L (ref 135–146)
Total Bilirubin: 0.2 mg/dL (ref 0.2–1.2)
Total Protein: 6.4 g/dL (ref 6.1–8.1)
eGFR: 108 mL/min/{1.73_m2} (ref >=60)

## 2021-07-02 LAB — CBC WITH DIFFERENTIAL/PLATELET
Absolute Monocytes: 507 {cells}/uL (ref 200–950)
Basophils Absolute: 39 {cells}/uL (ref 0–200)
Basophils Relative: 0.6 %
Eosinophils Absolute: 150 {cells}/uL (ref 15–500)
Eosinophils Relative: 2.3 %
HCT: 37.8 % (ref 35.0–45.0)
Hemoglobin: 12.7 g/dL (ref 11.7–15.5)
Lymphs Abs: 1560 {cells}/uL (ref 850–3900)
MCH: 29.7 pg (ref 27.0–33.0)
MCHC: 33.6 g/dL (ref 32.0–36.0)
MCV: 88.3 fL (ref 80.0–100.0)
MPV: 10.3 fL (ref 7.5–12.5)
Monocytes Relative: 7.8 %
Neutro Abs: 4245 {cells}/uL (ref 1500–7800)
Neutrophils Relative %: 65.3 %
Platelets: 269 Thousand/uL (ref 140–400)
RBC: 4.28 Million/uL (ref 3.80–5.10)
RDW: 13 % (ref 11.0–15.0)
Total Lymphocyte: 24 %
WBC: 6.5 Thousand/uL (ref 3.8–10.8)

## 2021-07-02 LAB — PROTEIN / CREATININE RATIO, URINE
Creatinine, Urine: 51 mg/dL (ref 20–275)
Protein/Creat Ratio: 118 mg/g creat (ref 24–184)
Protein/Creatinine Ratio: 0.118 mg/mg creat (ref 0.024–0.184)
Total Protein, Urine: 6 mg/dL (ref 5–24)

## 2021-07-02 LAB — C3 AND C4
C3 Complement: 121 mg/dL (ref 83–193)
C4 Complement: 27 mg/dL (ref 15–57)

## 2021-07-02 LAB — ANTI-DNA ANTIBODY, DOUBLE-STRANDED: ds DNA Ab: 1 [IU]/mL

## 2021-07-02 LAB — SEDIMENTATION RATE: Sed Rate: 11 mm/h (ref 0–20)

## 2021-08-12 ENCOUNTER — Other Ambulatory Visit: Payer: Self-pay | Admitting: Physician Assistant

## 2021-08-12 DIAGNOSIS — M359 Systemic involvement of connective tissue, unspecified: Secondary | ICD-10-CM

## 2021-08-12 NOTE — Telephone Encounter (Signed)
Next Visit: 11/30/2021   Last Visit: 06/30/2021   Labs: 06/30/2021 CBC and CMP [facility].  Eye exam: 11/05/2020 [facility]    Current Dose per office note 06/30/2021: Plaquenil 200 mg 1 tablet twice daily Monday through Friday.  VH:[id number] disease   Last Fill: 05/20/2021  Okay to refill Plaquenil?

## 2021-11-04 ENCOUNTER — Other Ambulatory Visit: Payer: Self-pay | Admitting: Rheumatology

## 2021-11-04 DIAGNOSIS — M359 Systemic involvement of connective tissue, unspecified: Secondary | ICD-10-CM

## 2021-11-04 NOTE — Telephone Encounter (Signed)
Next Visit: 11/30/2021    Last Visit: 06/30/2021    Labs: 06/30/2021 CBC and CMP [facility]NL.   Eye exam: 11/05/2020 [facility]     Current Dose per office note 06/30/2021: Plaquenil 200 mg 1 tablet twice daily Monday through Friday.   TY:[id number] disease    Last Fill: 08/12/2021  Patient advised she is due to update her PLQ eye exam. Patient will call to schedule exam and then let us know when it is scheduled for.    Okay to refill Plaquenil?

## 2021-11-22 NOTE — Progress Notes (Signed)
Office Visit Note  Patient: [name]             Date of Birth: 1975/05/15           MRN: [id number]             PCP: Helane Rima, MD Referring: Helane Rima, MD Visit Date: 11/30/2021 Occupation: _0 @  Subjective:  Medication monitoring   History of Present Illness: [name] is a 46 y.o. female with history of autoimmune disease and osteoarthritis.  She is currently taking Plaquenil 200 mg 1 tablet twice daily Monday through Friday.  She is tolerating Plaquenil without any side effects and has not missed any doses recently.  She denies any signs or symptoms of an autoimmune disease flare.  She has not experienced any joint pain or joint swelling at this time.  She has started to see a chiropractor on a regular basis which has helped to manage the discomfort she was experiencing in her right hip.  She denies any recent rashes, Raynaud's phenomenon, or signs of hair loss.  She has not noticed any swollen lymph nodes or recent fevers.  She denies any oral or nasal ulcerations.  She is not experiencing any sicca symptoms.  She denies any shortness of breath or pleuritic chest pain.  She denies any new medical conditions.  She has not had any recent or recurrent infections. She is scheduled to update her Plaquenil eye examination on 12/03/2021.    Activities of Daily Living:  Patient reports morning stiffness for 5 minutes.   Patient Reports nocturnal pain.  Difficulty dressing/grooming: Denies Difficulty climbing stairs: Reports Difficulty getting out of chair: Denies Difficulty using hands for taps, buttons, cutlery, and/or writing: Denies  Review of Systems  Constitutional:  Positive for fatigue.  HENT:  Negative for mouth sores and mouth dryness.   Eyes:  Negative for dryness.  Respiratory:  Negative for shortness of breath.   Cardiovascular:  Negative for chest pain and palpitations.  Gastrointestinal:  Negative for blood in stool, constipation and  diarrhea.  Endocrine: Negative for increased urination.  Genitourinary:  Negative for involuntary urination.  Musculoskeletal:  Positive for morning stiffness. Negative for joint pain, gait problem, joint pain, joint swelling, myalgias, muscle weakness, muscle tenderness and myalgias.  Skin:  Negative for color change, rash, hair loss and sensitivity to sunlight.  Allergic/Immunologic: Negative for susceptible to infections.  Neurological:  Positive for headaches. Negative for dizziness.  Hematological:  Negative for swollen glands.  Psychiatric/Behavioral:  Positive for depressed mood. Negative for sleep disturbance. The patient is nervous/anxious.     PMFS History:  Patient Active Problem List   Diagnosis Date Noted   Primary insomnia 04/04/2017   Autoimmune disease (HCC)positive ANA, positive Ro and positive La antibody. malar rash, history of oral ulcers and sicca symptoms 07/22/2016   High risk medication use 07/22/2016   History of hypothyroidism 07/22/2016   History of gastroesophageal reflux (GERD) 07/22/2016   Osteopenia of multiple sites 07/22/2016   History of depression 07/22/2016    Past Medical History:  Diagnosis Date   Abnormal Pap smear 1996   Asthma    Dyspareunia 2002   Endometriosis 2004   H/O amenorrhea 2009   H/O candidiasis    H/O cervicitis 2002   H/O dysmenorrhea 2004   H/O tinea cruris 2002   H/O varicella    H/O vulvovaginitis 2002   H/O: menorrhagia 2003   Hx: UTI (urinary tract infection)    Hypothyroidism  Libido, decreased 2002   Menses, irregular 2004   Ovarian cyst, left 2008   PCB (post coital bleeding) 2003   Pelvic pain in female 2007    Family History  Problem Relation Age of Onset   Asthma Mother    Diabetes Mother    Depression Mother    Asthma Father    Heart disease Brother        Heart Murmur   Arthritis Maternal Grandmother    Cancer Maternal Grandmother        Breast & liver   Depression Maternal Grandmother     Heart disease Maternal Grandfather        MI   Asthma Maternal Grandfather    Diabetes Maternal Grandfather    Kidney disease Maternal Grandfather        Dialysis   Past Surgical History:  Procedure Laterality Date   BREAST SURGERY     COLONOSCOPY  12/2019   GASTRIC     NOVASURE ABLATION     TUBAL LIGATION     UPPER GI ENDOSCOPY  12/2019   WISDOM TOOTH EXTRACTION  [id number]   Social History   Social History Narrative   Not on file   Immunization History  Administered Date(s) Administered   [employer] Vaccination 09/17/2019, 10/15/2019     Objective: Vital Signs: BP 130/85 (BP Location: Left Arm, Patient Position: Sitting, Cuff Size: Large)   Pulse 71   Resp 14   Ht _0  (1.549 m)   Wt 202 lb 3.2 oz (91.7 kg)   BMI 38.21 kg/m    Physical Exam Vitals and nursing note reviewed.  Constitutional:      Appearance: She is well-developed.  HENT:     Head: Normocephalic and atraumatic.  Eyes:     Conjunctiva/sclera: Conjunctivae normal.  Cardiovascular:     Rate and Rhythm: Normal rate and regular rhythm.     Heart sounds: Normal heart sounds.  Pulmonary:     Effort: Pulmonary effort is normal.     Breath sounds: Normal breath sounds.  Abdominal:     General: Bowel sounds are normal.     Palpations: Abdomen is soft.  Musculoskeletal:     Cervical back: Normal range of motion.  Skin:    General: Skin is warm and dry.     Capillary Refill: Capillary refill takes less than 2 seconds.  Neurological:     Mental Status: She is alert and oriented to person, place, and time.  Psychiatric:        Behavior: Behavior normal.      Musculoskeletal Exam: C-spine, thoracic spine, and lumbar spine good ROM.  No midline spinal tenderness.  No SI joint tenderness. Shoulder joints, elbow joints, wrist joints, MCPs, PIPs, and DIPs good ROM with no synovitis.  Complete fist formation bilaterally.  Hip joints, knee joints, and ankle joints have good ROM with no  discomfort.  No warmth or effusion of knee joints.  No tenderness or swelling of ankle joints. No tenderness over trochanteric bursa at this time.   CDAI Exam: CDAI Score: -- Patient Global: --; Provider Global: -- Swollen: --; Tender: -- Joint Exam 11/30/2021   No joint exam has been documented for this visit   There is currently no information documented on the homunculus. Go to the Rheumatology activity and complete the homunculus joint exam.  Investigation: No additional findings.  Imaging: No results found.  Recent Labs: Lab Results  Component Value Date   WBC 6.5 06/30/2021  HGB 12.7 06/30/2021   PLT 269 06/30/2021   NA 141 06/30/2021   K 4.4 06/30/2021   CL 107 06/30/2021   CO2 29 06/30/2021   GLUCOSE 89 06/30/2021   BUN 23 06/30/2021   CREATININE 0.66 06/30/2021   BILITOT 0.2 06/30/2021   ALKPHOS 43 11/02/2020   AST 11 06/30/2021   ALT 9 06/30/2021   PROT 6.4 06/30/2021   PROT 6.8 06/30/2021   ALBUMIN 4.0 11/02/2020   CALCIUM 9.1 06/30/2021   GFRAA 118 03/31/2020    Speciality Comments: PLQ eye exam: 11/05/2020 WNL [facility]am scheduled or 12/03/2021  Procedures:  No procedures performed Allergies: Sulfa drugs cross reactors   Assessment / Plan:     Visit Diagnoses: Autoimmune disease (HCC)positive ANA, positive Ro and positive La antibody. malar rash, history of oral ulcers and sicca symptoms - She has not had any signs or symptoms of an autoimmune disease flare.  She has clinically been doing well taking Plaquenil 200 mg 1 tablet by mouth twice daily Monday through Friday.  She is tolerating Plaquenil without any side effects and has not missed any doses recently.  She has no synovitis on examination today.  No cervical lymphadenopathy.  She has not had any symptoms of Raynaud's phenomenon and no digital ulcerations or signs of gangrene were noted on examination.  Capillary refills less than 2 seconds today.  No sign of a Maller rash.   No signs of alopecia.  Lungs were clear to auscultation.  She has not been experiencing any oral or nasal ulcerations.  No sicca symptoms.   Lab work from 06/30/21 was reviewed today in the office: dsDNA negative, complements WNL, ESR WNL, and protein creatinine ratio WNL.  Lab work was reviewed with the patient today in the office.  All questions were addressed.  The patient will remain on Plaquenil as prescribed.  She was advised to notify us if she develops signs or symptoms of a flare.  The following lab work will be obtained today for further evaluation.  She will follow-up in the office in 5 months or sooner if needed. Plan: CBC with Differential/Platelet, COMPLETE METABOLIC PANEL WITH GFR, Urinalysis, Routine w reflex microscopic, Anti-DNA antibody, double-stranded, C3 and C4, Sedimentation rate, ANA  High risk medication use - Plaquenil 200 mg 1 tablet twice daily Monday through Friday. - Plan: CBC with Differential/Platelet, COMPLETE METABOLIC PANEL WITH GFR CBC and CMP updated on 06/30/21.  Orders for CBC and CMP released today.  Her next lab work will be due in 5 months.   PLQ eye exam: 11/05/2020 WNL [place].  Scheduled for next eye exam on 12/03/21.  Patient was given a Plaquenil eye examination form to take with her to her upcoming appointment.  Trochanteric bursitis, right hip: Resolved.  No tenderness upon palpation.  She has been seeing a chiropractor on a regular basis which has been helpful in alleviating her discomfort.  Primary osteoarthritis of both knees: She has good range of motion of both knee joints on examination today.  No warmth or effusion noted.  Osteopenia of multiple sites - DEXA updated on 08/07/2019: LFN BMD 0.735 with T-score -2.2. History of gastric gastrectomy.  She is taking calcium and vitamin D supplement daily.  Other medical conditions are listed as follows:   Iron deficiency anemia following bariatric surgery  Other fatigue  Primary  insomnia  History of hypothyroidism  History of depression  History of gastroesophageal reflux (GERD)  Orders: Orders Placed This Encounter  Procedures   CBC with Differential/Platelet   COMPLETE METABOLIC PANEL WITH GFR   Urinalysis, Routine w reflex microscopic   Anti-DNA antibody, double-stranded   C3 and C4   Sedimentation rate   ANA   No orders of the defined types were placed in this encounter.   Follow-Up Instructions: Return in about 5 months (around 05/01/2022) for Autoimmune Disease, Osteoarthritis.   Ofilia Neas, PA-C  Note - This record has been created using Dragon software.  Chart creation errors have been sought, but may not always  have been located. Such creation errors do not reflect on  the standard of medical care.

## 2021-11-30 ENCOUNTER — Ambulatory Visit: Payer: Managed Care, Other (non HMO) | Attending: Physician Assistant | Admitting: Physician Assistant

## 2021-11-30 ENCOUNTER — Encounter: Payer: Self-pay | Admitting: Physician Assistant

## 2021-11-30 VITALS — BP 130/85 | HR 71 | Resp 14 | Ht 61.0 in | Wt 202.2 lb

## 2021-11-30 DIAGNOSIS — M170 Bilateral primary osteoarthritis of knee: Secondary | ICD-10-CM

## 2021-11-30 DIAGNOSIS — R5383 Other fatigue: Secondary | ICD-10-CM

## 2021-11-30 DIAGNOSIS — Z8639 Personal history of other endocrine, nutritional and metabolic disease: Secondary | ICD-10-CM

## 2021-11-30 DIAGNOSIS — M8589 Other specified disorders of bone density and structure, multiple sites: Secondary | ICD-10-CM

## 2021-11-30 DIAGNOSIS — Z79899 Other long term (current) drug therapy: Secondary | ICD-10-CM

## 2021-11-30 DIAGNOSIS — D508 Other iron deficiency anemias: Secondary | ICD-10-CM

## 2021-11-30 DIAGNOSIS — M7061 Trochanteric bursitis, right hip: Secondary | ICD-10-CM | POA: Diagnosis not present

## 2021-11-30 DIAGNOSIS — K9589 Other complications of other bariatric procedure: Secondary | ICD-10-CM

## 2021-11-30 DIAGNOSIS — Z8719 Personal history of other diseases of the digestive system: Secondary | ICD-10-CM

## 2021-11-30 DIAGNOSIS — M359 Systemic involvement of connective tissue, unspecified: Secondary | ICD-10-CM

## 2021-11-30 DIAGNOSIS — Z8659 Personal history of other mental and behavioral disorders: Secondary | ICD-10-CM

## 2021-11-30 DIAGNOSIS — F5101 Primary insomnia: Secondary | ICD-10-CM

## 2021-12-01 NOTE — Progress Notes (Signed)
UA revealed trace hgb, 2+ leukocytes, and few bacteria.  Please clarify if she is experiencing any dysuria, urinary frequency or urgency?  If she is experiencing any symptoms of a UTI please advise her to follow up with PCP for a urine culture.    CBC and CMP WNL. ESR WNL. Complements WNL

## 2021-12-01 NOTE — Progress Notes (Signed)
dsDNA is negative.  Labs are not consistent with a flare.

## 2021-12-03 LAB — CBC WITH DIFFERENTIAL/PLATELET
Absolute Monocytes: 472 cells/uL (ref 200–950)
Basophils Absolute: 71 cells/uL (ref 0–200)
Basophils Relative: 1.2 %
Eosinophils Absolute: 330 cells/uL (ref 15–500)
Eosinophils Relative: 5.6 %
HCT: 36.5 % (ref 35.0–45.0)
Hemoglobin: 12.1 g/dL (ref 11.7–15.5)
Lymphs Abs: 1670 cells/uL (ref 850–3900)
MCH: 28.7 pg (ref 27.0–33.0)
MCHC: 33.2 g/dL (ref 32.0–36.0)
MCV: 86.5 fL (ref 80.0–100.0)
MPV: 9.9 fL (ref 7.5–12.5)
Monocytes Relative: 8 %
Neutro Abs: 3357 cells/uL (ref 1500–7800)
Neutrophils Relative %: 56.9 %
Platelets: 284 10*3/uL (ref 140–400)
RBC: 4.22 10*6/uL (ref 3.80–5.10)
RDW: 12.6 % (ref 11.0–15.0)
Total Lymphocyte: 28.3 %
WBC: 5.9 10*3/uL (ref 3.8–10.8)

## 2021-12-03 LAB — URINALYSIS, ROUTINE W REFLEX MICROSCOPIC
Bilirubin Urine: NEGATIVE
Glucose, UA: NEGATIVE
Hyaline Cast: NONE SEEN /LPF
Ketones, ur: NEGATIVE
Nitrite: NEGATIVE
Protein, ur: NEGATIVE
Specific Gravity, Urine: 1.02 (ref 1.001–1.035)
pH: 5.5 (ref 5.0–8.0)

## 2021-12-03 LAB — C3 AND C4
C3 Complement: 123 mg/dL (ref 83–193)
C4 Complement: 25 mg/dL (ref 15–57)

## 2021-12-03 LAB — ANTI-NUCLEAR AB-TITER (ANA TITER): ANA Titer 1: 1:40 {titer} — ABNORMAL HIGH

## 2021-12-03 LAB — ANA: Anti Nuclear Antibody (ANA): POSITIVE — AB

## 2021-12-03 LAB — COMPLETE METABOLIC PANEL WITH GFR
AG Ratio: 1.4 (calc) (ref 1.0–2.5)
ALT: 7 U/L (ref 6–29)
AST: 10 U/L (ref 10–35)
Albumin: 4 g/dL (ref 3.6–5.1)
Alkaline phosphatase (APISO): 47 U/L (ref 31–125)
BUN: 18 mg/dL (ref 7–25)
CO2: 29 mmol/L (ref 20–32)
Calcium: 9 mg/dL (ref 8.6–10.2)
Chloride: 105 mmol/L (ref 98–110)
Creat: 0.6 mg/dL (ref 0.50–0.99)
Globulin: 2.9 g/dL (calc) (ref 1.9–3.7)
Glucose, Bld: 84 mg/dL (ref 65–139)
Potassium: 4.3 mmol/L (ref 3.5–5.3)
Sodium: 140 mmol/L (ref 135–146)
Total Bilirubin: 0.2 mg/dL (ref 0.2–1.2)
Total Protein: 6.9 g/dL (ref 6.1–8.1)
eGFR: 111 mL/min/{1.73_m2} (ref >=60)

## 2021-12-03 LAB — SEDIMENTATION RATE: Sed Rate: 14 mm/h (ref 0–20)

## 2021-12-03 LAB — ANTI-DNA ANTIBODY, DOUBLE-STRANDED: ds DNA Ab: 1 IU/mL

## 2021-12-03 LAB — MICROSCOPIC MESSAGE

## 2021-12-03 NOTE — Progress Notes (Signed)
ANA remains positive but is a low titer-unchanged.  No medication changes recommended at this time.

## 2022-01-27 ENCOUNTER — Other Ambulatory Visit: Payer: Self-pay | Admitting: Physician Assistant

## 2022-01-27 DIAGNOSIS — M359 Systemic involvement of connective tissue, unspecified: Secondary | ICD-10-CM

## 2022-01-27 NOTE — Telephone Encounter (Signed)
Next Visit: 05/04/2022  Last Visit: 11/30/2021  Labs: 12/22/2021 CBC & CMP: glucose 110, BUN/Creat ratio 28.4 11/30/2021 ANA remains positive but is a low titer-unchanged. No medication changes recommended at this time.UA revealed trace hgb, 2+ leukocytes, and few bacteria.  CBC and CMP WNL. ESR WNL. Complements WNL  Eye exam: 12/03/2021   Current Dose per office note on 11/30/2021: Plaquenil 200 mg 1 tablet twice daily Monday through Friday.   UJ:[id number] disease   Last Fill: 11/04/2021  Okay to refill Plaquenil?

## 2022-04-21 ENCOUNTER — Other Ambulatory Visit: Payer: Self-pay | Admitting: Physician Assistant

## 2022-04-21 DIAGNOSIS — M359 Systemic involvement of connective tissue, unspecified: Secondary | ICD-10-CM

## 2022-04-21 NOTE — Progress Notes (Signed)
Office Visit Note  Patient: [name]             Date of Birth: [birth year]           MRN: [id number]             PCP: Devra Dopp, MD Referring: Devra Dopp, MD Visit Date: 05/04/2022 Occupation: @GUAROCC @  Subjective:  Medication management  History of Present Illness: [name] is a 47 y.o. female history of autoimmune disease and osteoarthritis.  Patient states that she has been tolerating hydroxychloroquine without any side effects.  She denies any history of oral ulcers, nasal ulcers, malar rash, photosensitivity, inflammatory arthritis or lymphadenopathy.  She has intermittent discomfort in the trochanteric bursa.  She is going to a chiropractor which has been helpful.  She currently does not have any discomfort in her knee joints.  Patient states that she had DEXA scan last year and has been going to [facility] endocrinology for osteopenia.  She has been taking calcium and vitamin D.    Activities of Daily Living:  Patient reports morning stiffness for 5 minutes.   Patient Reports nocturnal pain.  Difficulty dressing/grooming: Denies Difficulty climbing stairs: Reports Difficulty getting out of chair: Reports Difficulty using hands for taps, buttons, cutlery, and/or writing: Denies  Review of Systems  Constitutional:  Positive for fatigue.  HENT:  Negative for mouth sores and mouth dryness.   Eyes:  Negative for dryness.  Respiratory:  Negative for shortness of breath.   Cardiovascular:  Negative for chest pain and palpitations.  Gastrointestinal:  Negative for blood in stool, constipation and diarrhea.  Endocrine: Negative for increased urination.  Genitourinary:  Negative for involuntary urination.  Musculoskeletal:  Positive for joint pain, joint pain and morning stiffness. Negative for gait problem, joint swelling, myalgias, muscle weakness, muscle tenderness and myalgias.  Skin:  Negative for color change, rash, hair loss and sensitivity to sunlight.   Allergic/Immunologic: Positive for susceptible to infections.  Neurological:  Negative for dizziness and headaches.  Hematological:  Negative for swollen glands.  Psychiatric/Behavioral:  Positive for sleep disturbance. Negative for depressed mood. The patient is not nervous/anxious.     PMFS History:  Patient Active Problem List   Diagnosis Date Noted   Primary insomnia 04/04/2017   Autoimmune disease (HCC)positive ANA, positive Ro and positive La antibody. malar rash, history of oral ulcers and sicca symptoms 07/22/2016   High risk medication use 07/22/2016   History of hypothyroidism 07/22/2016   History of gastroesophageal reflux (GERD) 07/22/2016   Osteopenia of multiple sites 07/22/2016   History of depression 07/22/2016    Past Medical History:  Diagnosis Date   Abnormal Pap smear 1996   Asthma    Dyspareunia 2002   Endometriosis 2004   H/O amenorrhea 2009   H/O candidiasis    H/O cervicitis 2002   H/O dysmenorrhea 2004   H/O tinea cruris 2002   H/O varicella    H/O vulvovaginitis 2002   H/O: menorrhagia 2003   Hx: UTI (urinary tract infection)    Hypothyroidism    Libido, decreased 2002   Menses, irregular 2004   Ovarian cyst, left 2008   PCB (post coital bleeding) 2003   Pelvic pain in female 2007    Family History  Problem Relation Age of Onset   Asthma Mother    Diabetes Mother    Depression Mother    Asthma Father    Heart disease Brother        Heart Murmur  Arthritis Maternal Grandmother    Cancer Maternal Grandmother        Breast & liver   Depression Maternal Grandmother    Heart disease Maternal Grandfather        MI   Asthma Maternal Grandfather    Diabetes Maternal Grandfather    Kidney disease Maternal Grandfather        Dialysis   Past Surgical History:  Procedure Laterality Date   BREAST SURGERY     COLONOSCOPY  12/2019   GASTRIC     NOVASURE ABLATION     TUBAL LIGATION     UPPER GI ENDOSCOPY  12/2019   WISDOM TOOTH EXTRACTION   [id number]   Social History   Social History Narrative   Not on file   Immunization History  Administered Date(s) Administered   [employer] Vaccination 09/17/2019, 10/15/2019     Objective: Vital Signs: BP 125/81 (BP Location: Left Arm, Patient Position: Sitting, Cuff Size: Normal)   Pulse 76   Resp 16   Ht 5\' 1"  (1.549 m)   Wt 213 lb 12.8 oz (97 kg)   BMI 40.40 kg/m    Physical Exam Vitals and nursing note reviewed.  Constitutional:      Appearance: She is well-developed.  HENT:     Head: Normocephalic and atraumatic.  Eyes:     Conjunctiva/sclera: Conjunctivae normal.  Cardiovascular:     Rate and Rhythm: Normal rate and regular rhythm.     Heart sounds: Normal heart sounds.  Pulmonary:     Effort: Pulmonary effort is normal.     Breath sounds: Normal breath sounds.  Abdominal:     General: Bowel sounds are normal.     Palpations: Abdomen is soft.  Musculoskeletal:     Cervical back: Normal range of motion.  Lymphadenopathy:     Cervical: No cervical adenopathy.  Skin:    General: Skin is warm and dry.     Capillary Refill: Capillary refill takes less than 2 seconds.  Neurological:     Mental Status: She is alert and oriented to person, place, and time.  Psychiatric:        Behavior: Behavior normal.      Musculoskeletal Exam: Cervical, thoracic and lumbar spine were in good range of motion.  Shoulder joints, elbow joints, wrist joints, MCPs PIPs and DIPs been good range of motion with no synovitis.  Hip joints, knee joints, ankles, MTPs and PIPs were in good range of motion with no synovitis.  CDAI Exam: CDAI Score: -- Patient Global: --; Provider Global: -- Swollen: --; Tender: -- Joint Exam 05/04/2022   No joint exam has been documented for this visit   There is currently no information documented on the homunculus. Go to the Rheumatology activity and complete the homunculus joint exam.  Investigation: No additional  findings.  Imaging: No results found.  Recent Labs: Lab Results  Component Value Date   WBC 5.9 11/30/2021   HGB 12.1 11/30/2021   PLT 284 11/30/2021   NA 140 11/30/2021   K 4.3 11/30/2021   CL 105 11/30/2021   CO2 29 11/30/2021   GLUCOSE 84 11/30/2021   BUN 18 11/30/2021   CREATININE 0.60 11/30/2021   BILITOT 0.2 11/30/2021   ALKPHOS 43 11/02/2020   AST 10 11/30/2021   ALT 7 11/30/2021   PROT 6.9 11/30/2021   ALBUMIN 4.0 11/02/2020   CALCIUM 9.0 11/30/2021   GFRAA 118 03/31/2020    Speciality Comments: PLQ eye exam: 12/03/2021 [facility]NL. [facility]. Follow up in 6 months. Plaquenil started October 14, 2013  Procedures:  No procedures performed Allergies: Sulfa drugs cross reactors   Assessment / Plan:     Visit Diagnoses: Autoimmune disease (HCC)positive ANA, positive Ro and positive La antibody. malar rash, history of oral ulcers and sicca symptoms -patient states that she has been doing well without any symptoms of oral ulcers, nasal ulcers, malar rash, photosensitivity or lymphadenopathy.  She denies any sicca symptoms today.  She states she went on a cruise and came back.  There is no history of inflammatory arthritis.  Patient has been on hydroxychloroquine 200 mg p.o. twice daily Monday to Friday since October 14, 2013.  As she has been asymptomatic and her labs have been stable I advised her to reduce the dose of hydroxychloroquine to 200 mg p.o. daily.  Advised patient to call us in case she develops a flare.  Use of sunscreen was also advised.  Labs obtained on November 30, 2021 were reviewed.  Double-stranded ENA was negative, complements were normal and sed rate was normal.  Increased risk of heart disease with autoimmune disease was advised.  Information regarding heart disease prevention was placed in the AVS.  Plan: Protein / creatinine ratio, urine, Anti-DNA antibody, double-stranded, C3 and C4, Sedimentation rate, hydroxychloroquine (PLAQUENIL) 200 MG  tablet  High risk medication use - Plaquenil 200 mg 1 tablet twice daily Monday through Friday. PLQ eye exam: 12/03/2021 - Plan: CBC with Differential/Platelet, COMPLETE METABOLIC PANEL WITH GFR today.  Information on immunization was placed in the AVS.  Annual eye examination to monitor for ocular toxicity was advised.  CBC and CMP were normal on November 30, 2021.  Trochanteric bursitis, right hip -she gets intermittent symptoms.  IT band stretches were discussed.  Primary osteoarthritis of both knees-no warmth swelling or effusion was noted.  She denies any discomfort today.  Osteopenia of multiple sites - DEXA updated on 08/07/2019: LFN BMD 0.735 with T-score -2.2. History of gastric gastrectomy.  Patient is followed by [facility] endocrinology.  Other medical problems are listed as follows:  Primary insomnia  Other fatigue  History of hypothyroidism  Iron deficiency anemia following bariatric surgery  History of gastroesophageal reflux (GERD)  History of depression  Orders: Orders Placed This Encounter  Procedures   Protein / creatinine ratio, urine   CBC with Differential/Platelet   COMPLETE METABOLIC PANEL WITH GFR   Anti-DNA antibody, double-stranded   C3 and C4   Sedimentation rate   Meds ordered this encounter  Medications   hydroxychloroquine (PLAQUENIL) 200 MG tablet    Sig: Take 1 tablet (200 mg total) by mouth daily.    Dispense:  90 tablet    Refill:  0     Follow-Up Instructions: Return in about 5 months (around 10/04/2022) for Autoimmune disease.   Pollyann Savoy, MD  Note - This record has been created using Animal nutritionist.  Chart creation errors have been sought, but may not always  have been located. Such creation errors do not reflect on  the standard of medical care.

## 2022-04-21 NOTE — Telephone Encounter (Signed)
Last Fill: 01/27/2022  Eye exam: 12/03/2021 WNL.   Labs: 12/22/2021 Glucose 110, BUN/Creat Ratio 28.4   Next Visit: 05/04/2022  Last Visit: 11/30/2021  DX: Autoimmune disease   Current Dose per office note 11/30/2021:  Plaquenil 200 mg 1 tablet twice daily Monday through Friday.   Okay to refill Plaquenil?

## 2022-05-04 ENCOUNTER — Ambulatory Visit: Payer: Managed Care, Other (non HMO) | Attending: Rheumatology | Admitting: Rheumatology

## 2022-05-04 ENCOUNTER — Encounter: Payer: Self-pay | Admitting: Rheumatology

## 2022-05-04 VITALS — BP 125/81 | HR 76 | Resp 16 | Ht 61.0 in | Wt 213.8 lb

## 2022-05-04 DIAGNOSIS — M170 Bilateral primary osteoarthritis of knee: Secondary | ICD-10-CM | POA: Diagnosis not present

## 2022-05-04 DIAGNOSIS — M7061 Trochanteric bursitis, right hip: Secondary | ICD-10-CM | POA: Diagnosis not present

## 2022-05-04 DIAGNOSIS — D508 Other iron deficiency anemias: Secondary | ICD-10-CM

## 2022-05-04 DIAGNOSIS — K9589 Other complications of other bariatric procedure: Secondary | ICD-10-CM

## 2022-05-04 DIAGNOSIS — Z8719 Personal history of other diseases of the digestive system: Secondary | ICD-10-CM

## 2022-05-04 DIAGNOSIS — F5101 Primary insomnia: Secondary | ICD-10-CM

## 2022-05-04 DIAGNOSIS — Z79899 Other long term (current) drug therapy: Secondary | ICD-10-CM | POA: Diagnosis not present

## 2022-05-04 DIAGNOSIS — M359 Systemic involvement of connective tissue, unspecified: Secondary | ICD-10-CM | POA: Diagnosis not present

## 2022-05-04 DIAGNOSIS — M8589 Other specified disorders of bone density and structure, multiple sites: Secondary | ICD-10-CM

## 2022-05-04 DIAGNOSIS — Z8639 Personal history of other endocrine, nutritional and metabolic disease: Secondary | ICD-10-CM

## 2022-05-04 DIAGNOSIS — Z8659 Personal history of other mental and behavioral disorders: Secondary | ICD-10-CM

## 2022-05-04 DIAGNOSIS — R5383 Other fatigue: Secondary | ICD-10-CM

## 2022-05-04 MED ORDER — HYDROXYCHLOROQUINE SULFATE 200 MG PO TABS: 200.0000 mg | ORAL_TABLET | Freq: Every day | ORAL | 0 refills | Status: DC

## 2022-05-04 NOTE — Patient Instructions (Signed)
Please change the dose of hydroxychloroquine to 200 mg by mouth daily.  Vaccines You are taking a medication(s) that can suppress your immune system.  The following immunizations are recommended: Flu annually Covid-19  Td/Tdap (tetanus, diphtheria, pertussis) every 10 years Pneumonia (Prevnar 15 then Pneumovax 23 at least 1 year apart.  Alternatively, can take Prevnar 20 without needing additional dose) Shingrix: 2 doses from 4 weeks to 6 months apart  Please check with your PCP to make sure you are up to date.

## 2022-05-05 LAB — COMPLETE METABOLIC PANEL WITH GFR
AG Ratio: 1.5 (calc) (ref 1.0–2.5)
ALT: 11 U/L (ref 6–29)
AST: 12 U/L (ref 10–35)
Albumin: 4.1 g/dL (ref 3.6–5.1)
Alkaline phosphatase (APISO): 58 U/L (ref 31–125)
BUN: 22 mg/dL (ref 7–25)
CO2: 28 mmol/L (ref 20–32)
Calcium: 9.3 mg/dL (ref 8.6–10.2)
Chloride: 106 mmol/L (ref 98–110)
Creat: 0.65 mg/dL (ref 0.50–0.99)
Globulin: 2.8 g/dL (calc) (ref 1.9–3.7)
Glucose, Bld: 93 mg/dL (ref 65–99)
Potassium: 4.2 mmol/L (ref 3.5–5.3)
Sodium: 142 mmol/L (ref 135–146)
Total Bilirubin: 0.2 mg/dL (ref 0.2–1.2)
Total Protein: 6.9 g/dL (ref 6.1–8.1)
eGFR: 108 mL/min/{1.73_m2} (ref >=60)

## 2022-05-05 LAB — CBC WITH DIFFERENTIAL/PLATELET
Absolute Monocytes: 555 cells/uL (ref 200–950)
Basophils Absolute: 37 cells/uL (ref 0–200)
Basophils Relative: 0.5 %
Eosinophils Absolute: 200 cells/uL (ref 15–500)
Eosinophils Relative: 2.7 %
HCT: 37.2 % (ref 35.0–45.0)
Hemoglobin: 12.4 g/dL (ref 11.7–15.5)
Lymphs Abs: 1894 cells/uL (ref 850–3900)
MCH: 28.6 pg (ref 27.0–33.0)
MCHC: 33.3 g/dL (ref 32.0–36.0)
MCV: 85.7 fL (ref 80.0–100.0)
MPV: 10.2 fL (ref 7.5–12.5)
Monocytes Relative: 7.5 %
Neutro Abs: 4714 cells/uL (ref 1500–7800)
Neutrophils Relative %: 63.7 %
Platelets: 298 10*3/uL (ref 140–400)
RBC: 4.34 10*6/uL (ref 3.80–5.10)
RDW: 12.5 % (ref 11.0–15.0)
Total Lymphocyte: 25.6 %
WBC: 7.4 10*3/uL (ref 3.8–10.8)

## 2022-05-05 LAB — PROTEIN / CREATININE RATIO, URINE
Creatinine, Urine: 100 mg/dL (ref 20–275)
Protein/Creat Ratio: 120 mg/g creat (ref 24–184)
Protein/Creatinine Ratio: 0.12 mg/mg creat (ref 0.024–0.184)
Total Protein, Urine: 12 mg/dL (ref 5–24)

## 2022-05-05 LAB — C3 AND C4
C3 Complement: 130 mg/dL (ref 83–193)
C4 Complement: 27 mg/dL (ref 15–57)

## 2022-05-05 LAB — ANTI-DNA ANTIBODY, DOUBLE-STRANDED: ds DNA Ab: 1 IU/mL

## 2022-05-05 LAB — SEDIMENTATION RATE: Sed Rate: 17 mm/h (ref 0–20)

## 2022-05-06 NOTE — Progress Notes (Signed)
CBC and CMP are normal.  Urine protein normal.  Sed rate normal, complements normal, double-stranded DNA negative.  Labs do not indicate an autoimmune disease flare.

## 2022-06-20 ENCOUNTER — Encounter: Payer: Self-pay | Admitting: Rheumatology

## 2022-06-20 DIAGNOSIS — M359 Systemic involvement of connective tissue, unspecified: Secondary | ICD-10-CM

## 2022-06-20 MED ORDER — HYDROXYCHLOROQUINE SULFATE 200 MG PO TABS: ORAL_TABLET | ORAL | 0 refills | Status: DC

## 2022-06-23 IMAGING — CT CT ABD-PELV W/ CM
2 of 5 series · 17 of 46 positions shown, 19 images · IV contrast (Omnipaque)
Comparison: CT abdomen pelvis with contrast 07/08/2017.

CLINICAL DATA: Acute nonlocalized abdominal pain.

EXAM:
CT ABDOMEN AND PELVIS WITH CONTRAST
TECHNIQUE: Multidetector CT imaging of the abdomen and pelvis was performed
using the standard protocol following bolus administration of
intravenous contrast.
CONTRAST:  100mL OMNIPAQUE IOHEXOL 300 MG/ML  SOLN

[Series 2: axial st · axial · 0.98mm/px · z∈[+498,+932]mm · 14 of 99 slices shown, 16 images]
[im 6/99  soft-tissue]
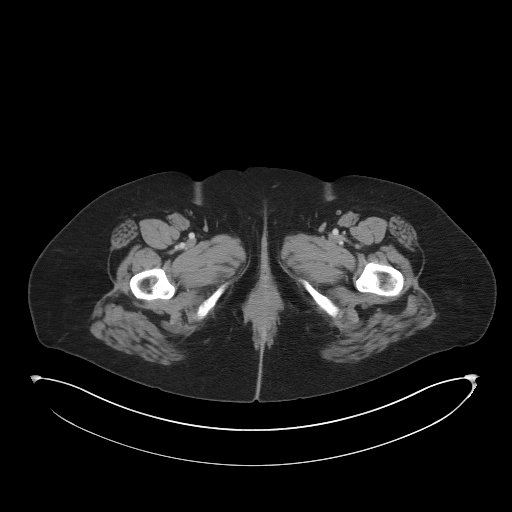
[im 6/99  bone]
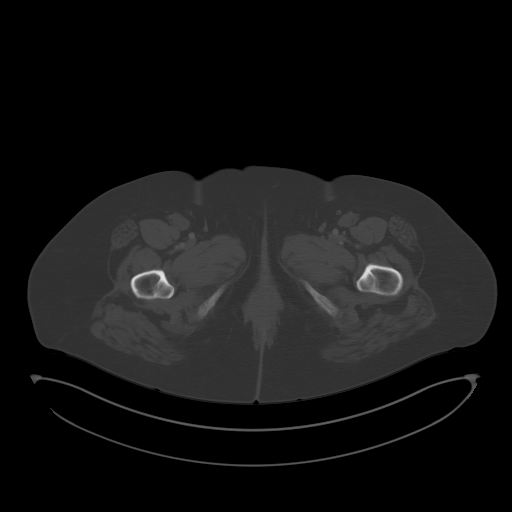
[im 11/99  soft-tissue]
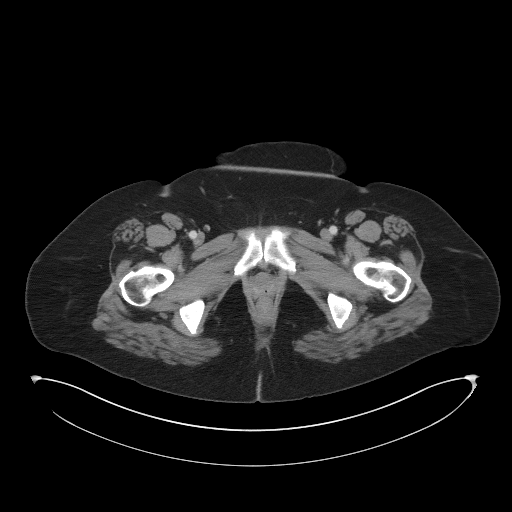
[im 21/99  soft-tissue]
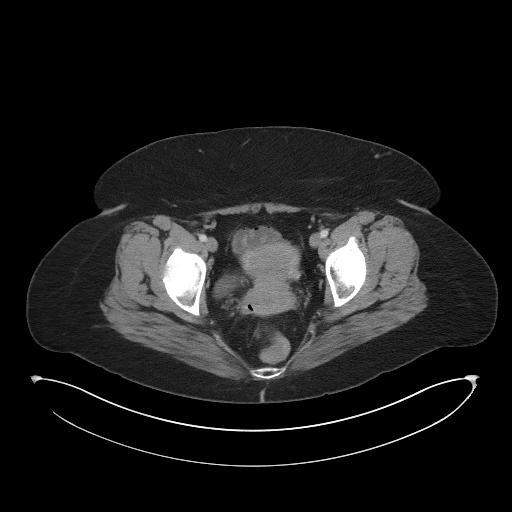
[im 26/99  soft-tissue]
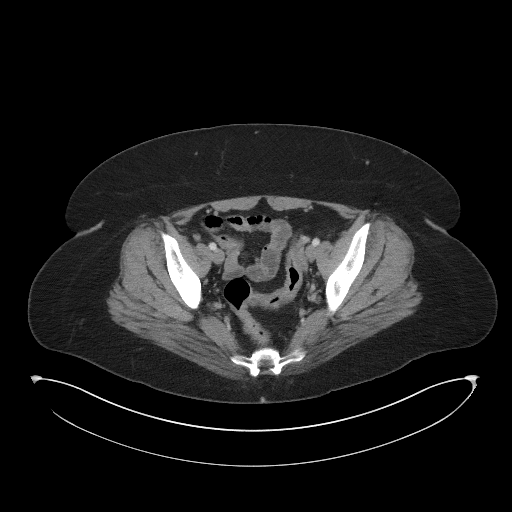
[im 31/99  soft-tissue]
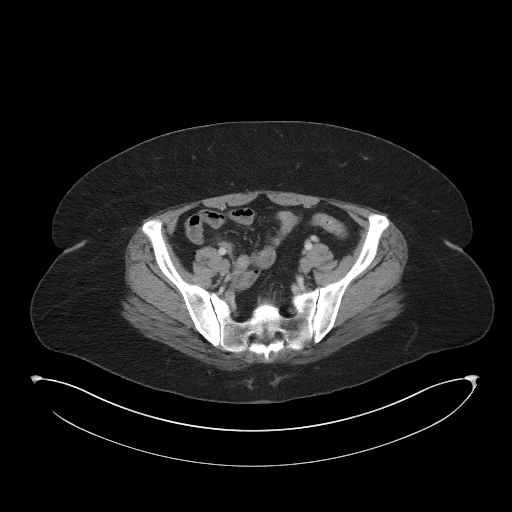
[im 42/99  soft-tissue]
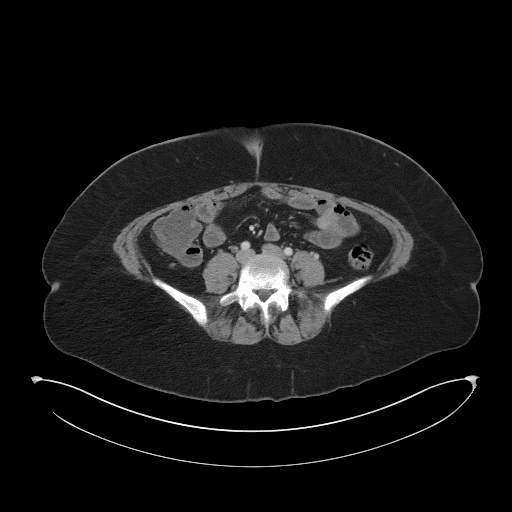
[im 47/99  soft-tissue]
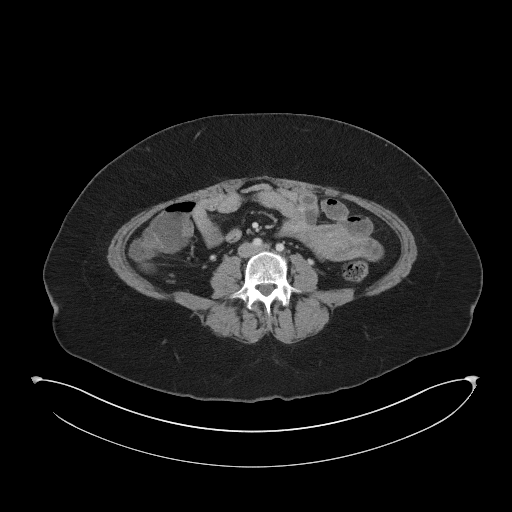
[im 52/99  soft-tissue]
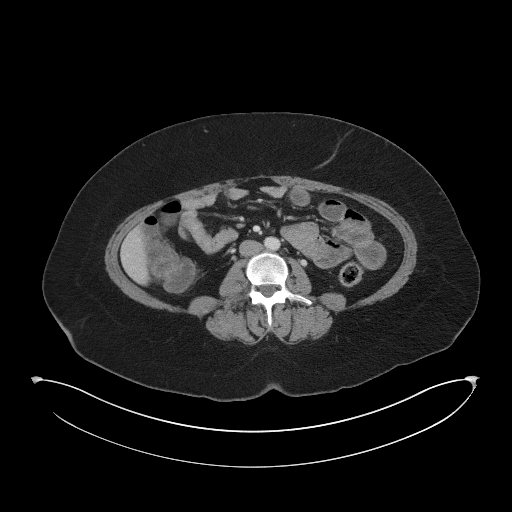
[im 57/99  soft-tissue]
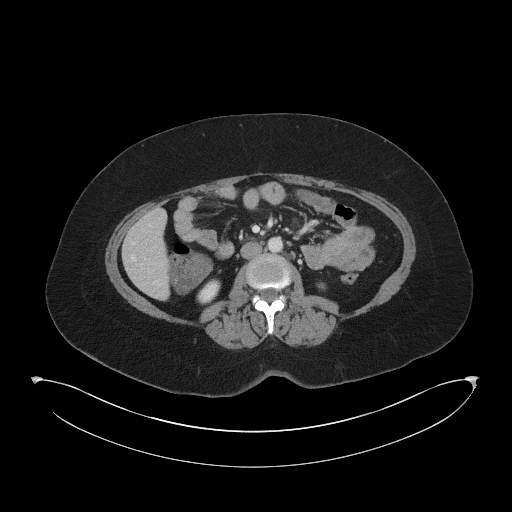
[im 57/99  bone]
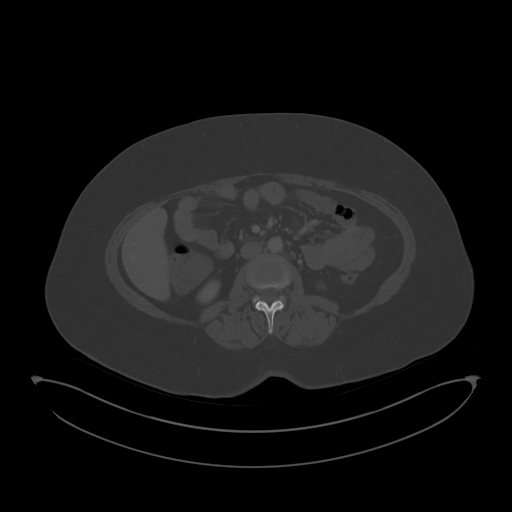
[im 68/99  soft-tissue]
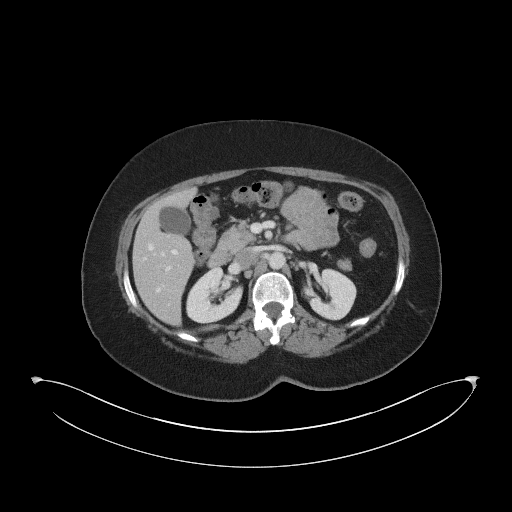
[im 73/99  soft-tissue]
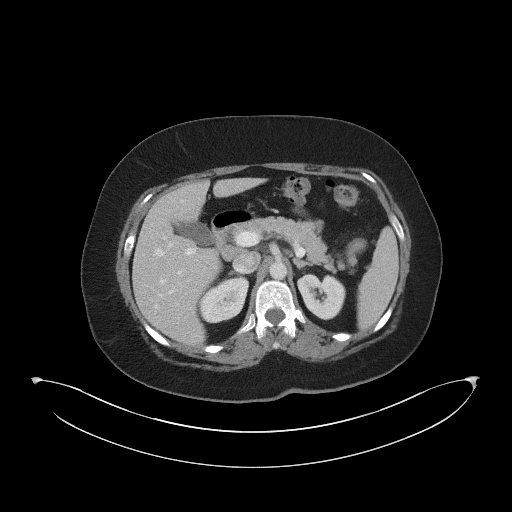
[im 78/99  soft-tissue]
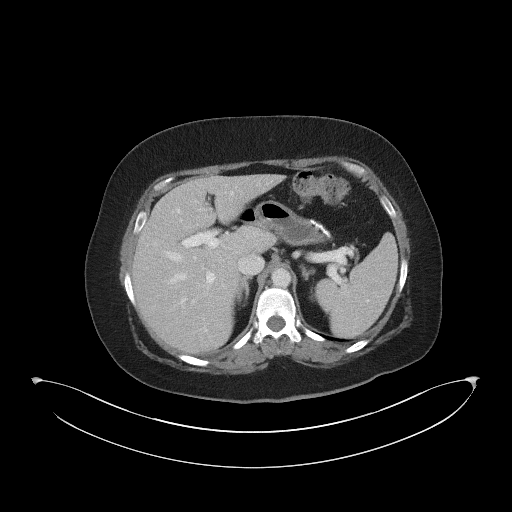
[im 88/99  soft-tissue]
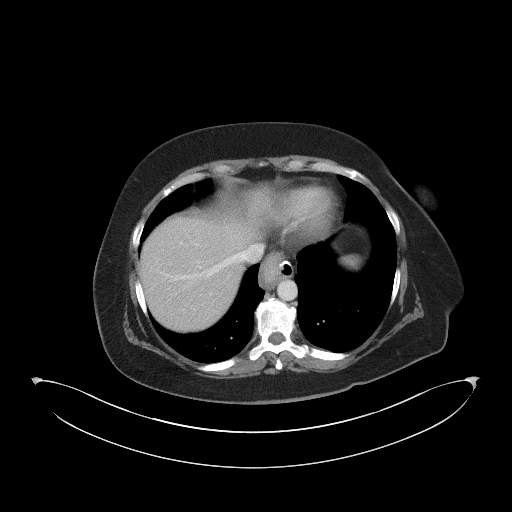
[im 93/99  soft-tissue]
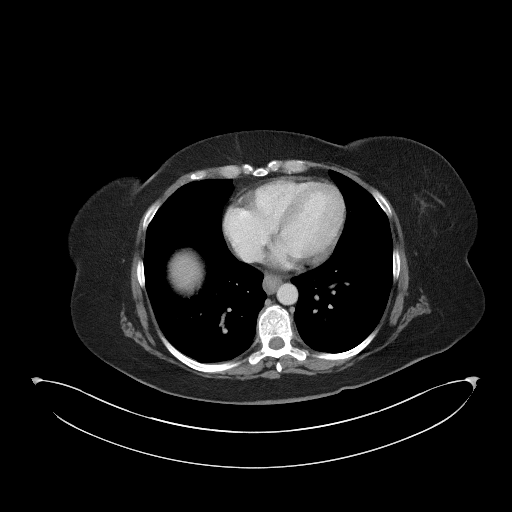

[Series 5: coronal st · coronal · 0.99mm/px · 3 of 110 slices shown]
[im 37/110  soft-tissue]
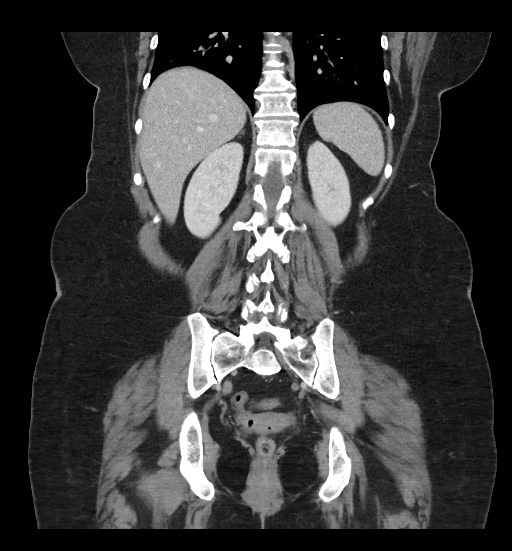
[im 49/110  soft-tissue]
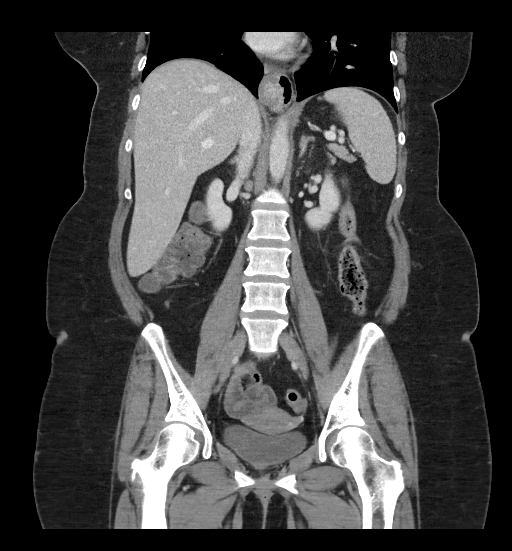
[im 61/110  soft-tissue]
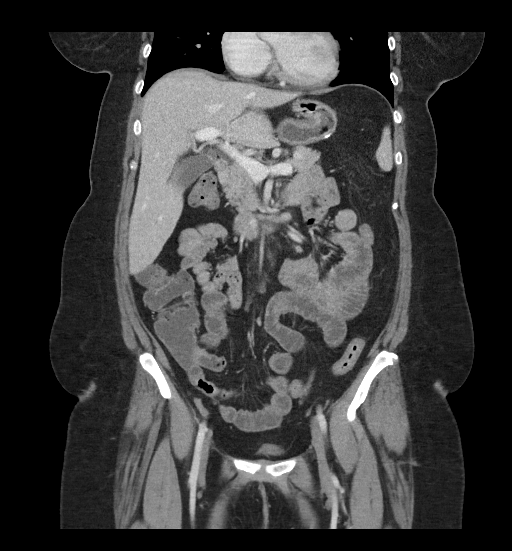

[17 of 46 positions shown; findings below may reference images not displayed]

FINDINGS: Lower chest: Small hiatal hernia. Lung bases are clear. Cardiac size
is normal.

Hepatobiliary: The liver is 22.7 cm in length and mildly steatotic,
without mass. There are tiny stones in the gallbladder but no wall
thickening or biliary dilatation.

Pancreas: No mass or ductal dilatation

Spleen: Normal in size and enhancement

Adrenals/Urinary Tract: There is no adrenal mass. The bilateral
renal cortex enhances normally. There are no stones or
hydronephrosis

Stomach/Bowel: Postsurgical changes of partial gastrectomy. Small
hiatal hernia. There are mildly dilated left upper abdominal small
bowel loops up to 2.6 cm caliber but no visible transitional segment
no mesenteric congestion. The appendix is normal. No evidence of
colitis or diverticulitis

Vascular/Lymphatic: There is no aortic aneurysm no portal vein
dilatation. There is mild left pelvic venous congestion.

Reproductive: Uterus intact.  Ovaries are not enlarged.

Other: Small umbilical fat hernia.

Musculoskeletal: Spondylosis and degenerative disc disease of the
lower thoracic spine.
IMPRESSION: No acute process identified. Chronic findings noted previously and
unchanged.

## 2022-07-28 ENCOUNTER — Other Ambulatory Visit: Payer: Self-pay | Admitting: Physician Assistant

## 2022-07-28 DIAGNOSIS — M359 Systemic involvement of connective tissue, unspecified: Secondary | ICD-10-CM

## 2022-08-11 ENCOUNTER — Ambulatory Visit: Payer: Managed Care, Other (non HMO) | Admitting: Orthopaedic Surgery

## 2022-08-11 ENCOUNTER — Other Ambulatory Visit (INDEPENDENT_AMBULATORY_CARE_PROVIDER_SITE_OTHER): Payer: Managed Care, Other (non HMO)

## 2022-08-11 ENCOUNTER — Encounter: Payer: Self-pay | Admitting: Orthopaedic Surgery

## 2022-08-11 DIAGNOSIS — M546 Pain in thoracic spine: Secondary | ICD-10-CM

## 2022-08-11 MED ORDER — PREDNISONE 10 MG (21) PO TBPK: ORAL_TABLET | ORAL | 3 refills | Status: DC

## 2022-08-11 MED ORDER — METHOCARBAMOL 750 MG PO TABS: 750.0000 mg | ORAL_TABLET | Freq: Two times a day (BID) | ORAL | 3 refills | Status: AC | PRN

## 2022-08-11 NOTE — Progress Notes (Signed)
Office Visit Note   Patient: [name]           Date of Birth: [birth year]           MRN: [id number] Visit Date: 08/11/2022              Requested by: Devra Dopp, MD No address on file PCP: Devra Dopp, MD   Assessment & Plan: Visit Diagnoses:  1. Pain in thoracic spine     Plan: [name] is a 47 year old female with mid back pain for a month.  We will try different muscle relaxer.  Robaxin was sent as well as prednisone Dosepak.  I made a referral to outpatient physical therapy.  Follow-up as needed.  Follow-Up Instructions: No follow-ups on file.   Orders:  Orders Placed This Encounter  Procedures   XR Thoracic Spine 2 View   Ambulatory referral to Physical Therapy   Meds ordered this encounter  Medications   methocarbamol (ROBAXIN) 750 MG tablet    Sig: Take 1 tablet (750 mg total) by mouth 2 (two) times daily as needed for muscle spasms.    Dispense:  20 tablet    Refill:  3   predniSONE (STERAPRED UNI-PAK 21 TAB) 10 MG (21) TBPK tablet    Sig: Take as directed    Dispense:  21 tablet    Refill:  3      Procedures: No procedures performed   Clinical Data: No additional findings.   Subjective: Chief Complaint  Patient presents with   Middle Back - Pain    HPI [name] is a 47 year old female here for evaluation of mid back pain for a month.  Denies any injuries or changes in activity.  Hurts to take a deep breath and feels radiation to the left side of her thorax.  Denies any rashes or urinary symptoms.  She has tried massage chiropractor and Flexeril which have not helped. Review of Systems  Constitutional: Negative.   HENT: Negative.    Eyes: Negative.   Respiratory: Negative.    Cardiovascular: Negative.   Endocrine: Negative.   Musculoskeletal: Negative.   Neurological: Negative.   Hematological: Negative.   Psychiatric/Behavioral: Negative.    All other systems reviewed and are negative.    Objective: Vital Signs: There were  no vitals taken for this visit.  Physical Exam Vitals and nursing note reviewed.  Constitutional:      Appearance: She is well-developed.  HENT:     Head: Atraumatic.     Nose: Nose normal.  Eyes:     Extraocular Movements: Extraocular movements intact.  Cardiovascular:     Pulses: Normal pulses.  Pulmonary:     Effort: Pulmonary effort is normal.  Abdominal:     Palpations: Abdomen is soft.  Musculoskeletal:     Cervical back: Neck supple.  Skin:    General: Skin is warm.     Capillary Refill: Capillary refill takes less than 2 seconds.  Neurological:     Mental Status: She is alert. Mental status is at baseline.  Psychiatric:        Behavior: Behavior normal.        Thought Content: Thought content normal.        Judgment: Judgment normal.     Ortho Exam Examination of the thoracic spine shows no palpable deformities or step-offs.  Slight tenderness to the paraspinous muscles.  No radicular symptoms.  No focal motor or sensory deficits. Specialty Comments:  No specialty comments available.  Imaging:  XR Thoracic Spine 2 View  Result Date: 08/11/2022 X-rays show thoracic kyphosis.  No acute abnormalities.  Mild spurring of the anterior vertebral bodies.    PMFS History: Patient Active Problem List   Diagnosis Date Noted   Primary insomnia 04/04/2017   Autoimmune disease (HCC)positive ANA, positive Ro and positive La antibody. malar rash, history of oral ulcers and sicca symptoms 07/22/2016   High risk medication use 07/22/2016   History of hypothyroidism 07/22/2016   History of gastroesophageal reflux (GERD) 07/22/2016   Osteopenia of multiple sites 07/22/2016   History of depression 07/22/2016   Past Medical History:  Diagnosis Date   Abnormal Pap smear 1996   Asthma    Dyspareunia 2002   Endometriosis 2004   H/O amenorrhea 2009   H/O candidiasis    H/O cervicitis 2002   H/O dysmenorrhea 2004   H/O tinea cruris 2002   H/O varicella    H/O  vulvovaginitis 2002   H/O: menorrhagia 2003   Hx: UTI (urinary tract infection)    Hypothyroidism    Libido, decreased 2002   Menses, irregular 2004   Ovarian cyst, left 2008   PCB (post coital bleeding) 2003   Pelvic pain in female 2007    Family History  Problem Relation Age of Onset   Asthma Mother    Diabetes Mother    Depression Mother    Asthma Father    Heart disease Brother        Heart Murmur   Arthritis Maternal Grandmother    Cancer Maternal Grandmother        Breast & liver   Depression Maternal Grandmother    Heart disease Maternal Grandfather        MI   Asthma Maternal Grandfather    Diabetes Maternal Grandfather    Kidney disease Maternal Grandfather        Dialysis    Past Surgical History:  Procedure Laterality Date   BREAST SURGERY     COLONOSCOPY  12/2019   GASTRIC     NOVASURE ABLATION     TUBAL LIGATION     UPPER GI ENDOSCOPY  12/2019   WISDOM TOOTH EXTRACTION  [id number]   Social History   Occupational History   Not on file  Tobacco Use   Smoking status: Never    Passive exposure: Current   Smokeless tobacco: Never  Vaping Use   Vaping status: Never Used  Substance and Sexual Activity   Alcohol use: Yes    Comment: occ   Drug use: No   Sexual activity: Yes    Birth control/protection: Surgical    Comment: BTL

## 2022-08-13 ENCOUNTER — Other Ambulatory Visit: Payer: Self-pay | Admitting: Physician Assistant

## 2022-08-13 DIAGNOSIS — M359 Systemic involvement of connective tissue, unspecified: Secondary | ICD-10-CM

## 2022-08-15 NOTE — Telephone Encounter (Signed)
Last Fill: 06/20/2022  Eye exam: 12/03/2021 WNL.  Labs: 05/04/2022 CBC and CMP are normal.   Next Visit: 10/05/2022  Last Visit: 05/04/2022  DX: Autoimmune disease   Current Dose per office note 05/04/2022: Plaquenil 200 mg 1 tablet twice daily Monday through Friday.   Okay to refill Plaquenil?

## 2022-08-16 ENCOUNTER — Encounter: Payer: Self-pay | Admitting: Orthopaedic Surgery

## 2022-08-16 NOTE — Telephone Encounter (Signed)
I looked over note and history and spoke to patient.  I am having her reach out to pcp to make sure they do not think they need to r/o a pe.  Patient will let us know what they say after speaking to them.

## 2022-09-20 ENCOUNTER — Telehealth: Payer: Self-pay | Admitting: *Deleted

## 2022-09-20 NOTE — Telephone Encounter (Signed)
Per Sherron Ales, PA-C    Please notify patient of this safety warning.   Please clarify if she has had a recent EKG?   [employer] works with your patients' plan sponsors to provide you with the enclosed [employer] safety and health considerations for patients in your practice. Please review the health information provided and make any changes in therapy that you believe are appropriate. [employer] understands that the information may not be applicable to every patient's therapy and therefore presents it as informational only.  Adverse Drug Interaction: HYDROXYCHLOROQUINE SULFATE and CITALOPRAM HBR Your patient is receiving HYDROXYCHLOROQUINE SULFATE and CITALOPRAM HBR based on claims records. Hydroxychloroquine should not be administered with other drugs that have the potential to prolong the QT interval. Patients may be at increased risk of ventricular arrhythmias, including torsades de pointes.    Attempted to contact the patient and left message for patient to call the office.

## 2022-09-21 NOTE — Telephone Encounter (Signed)
Patient aware of safety warning. Patient states she had an EKG at her physical with her PCP last year.

## 2022-10-05 ENCOUNTER — Encounter: Payer: Self-pay | Admitting: Physician Assistant

## 2022-10-05 ENCOUNTER — Ambulatory Visit: Payer: Managed Care, Other (non HMO) | Attending: Physician Assistant | Admitting: Physician Assistant

## 2022-10-05 VITALS — BP 136/89 | HR 75 | Resp 15 | Ht 61.0 in | Wt 218.6 lb

## 2022-10-05 DIAGNOSIS — M170 Bilateral primary osteoarthritis of knee: Secondary | ICD-10-CM

## 2022-10-05 DIAGNOSIS — M8589 Other specified disorders of bone density and structure, multiple sites: Secondary | ICD-10-CM

## 2022-10-05 DIAGNOSIS — Z79899 Other long term (current) drug therapy: Secondary | ICD-10-CM | POA: Diagnosis not present

## 2022-10-05 DIAGNOSIS — M359 Systemic involvement of connective tissue, unspecified: Secondary | ICD-10-CM | POA: Diagnosis not present

## 2022-10-05 DIAGNOSIS — F5101 Primary insomnia: Secondary | ICD-10-CM

## 2022-10-05 DIAGNOSIS — R5383 Other fatigue: Secondary | ICD-10-CM

## 2022-10-05 DIAGNOSIS — Z8659 Personal history of other mental and behavioral disorders: Secondary | ICD-10-CM

## 2022-10-05 DIAGNOSIS — Z8639 Personal history of other endocrine, nutritional and metabolic disease: Secondary | ICD-10-CM

## 2022-10-05 DIAGNOSIS — M7061 Trochanteric bursitis, right hip: Secondary | ICD-10-CM

## 2022-10-05 DIAGNOSIS — K9589 Other complications of other bariatric procedure: Secondary | ICD-10-CM

## 2022-10-05 DIAGNOSIS — D508 Other iron deficiency anemias: Secondary | ICD-10-CM

## 2022-10-05 DIAGNOSIS — Z8719 Personal history of other diseases of the digestive system: Secondary | ICD-10-CM

## 2022-10-06 LAB — ANTI-DNA ANTIBODY, DOUBLE-STRANDED: ds DNA Ab: 1 [IU]/mL

## 2022-10-06 LAB — PROTEIN / CREATININE RATIO, URINE
Creatinine, Urine: 121 mg/dL (ref 20–275)
Protein/Creat Ratio: 99 mg/g{creat} (ref 24–184)
Protein/Creatinine Ratio: 0.099 mg/mg{creat} (ref 0.024–0.184)
Total Protein, Urine: 12 mg/dL (ref 5–24)

## 2022-10-06 LAB — SEDIMENTATION RATE: Sed Rate: 19 mm/h (ref 0–20)

## 2022-10-06 LAB — C3 AND C4
C3 Complement: 145 mg/dL (ref 83–193)
C4 Complement: 25 mg/dL (ref 15–57)

## 2022-10-06 NOTE — Progress Notes (Signed)
ESR WNL Protein creatinine ratio WNL

## 2022-10-06 NOTE — Progress Notes (Signed)
Complements WNL

## 2022-10-07 NOTE — Progress Notes (Signed)
dsDNA is negative.  Labs are not consistent with a flare.

## 2022-12-12 DIAGNOSIS — M359 Systemic involvement of connective tissue, unspecified: Secondary | ICD-10-CM

## 2022-12-12 DIAGNOSIS — Z79899 Other long term (current) drug therapy: Secondary | ICD-10-CM

## 2022-12-12 DIAGNOSIS — D508 Other iron deficiency anemias: Secondary | ICD-10-CM

## 2022-12-12 DIAGNOSIS — R5383 Other fatigue: Secondary | ICD-10-CM

## 2022-12-12 NOTE — Telephone Encounter (Signed)
Recommend updating lupus lab work for further evaluation--- okay to also add TSH, iron panel, vitamin B 12, and vitamin D

## 2022-12-13 ENCOUNTER — Other Ambulatory Visit: Payer: Self-pay | Admitting: *Deleted

## 2022-12-13 DIAGNOSIS — R5383 Other fatigue: Secondary | ICD-10-CM

## 2022-12-13 DIAGNOSIS — Z79899 Other long term (current) drug therapy: Secondary | ICD-10-CM

## 2022-12-13 DIAGNOSIS — M359 Systemic involvement of connective tissue, unspecified: Secondary | ICD-10-CM

## 2022-12-13 DIAGNOSIS — K9589 Other complications of other bariatric procedure: Secondary | ICD-10-CM

## 2022-12-15 ENCOUNTER — Telehealth: Payer: Self-pay | Admitting: *Deleted

## 2022-12-15 DIAGNOSIS — Z79899 Other long term (current) drug therapy: Secondary | ICD-10-CM

## 2022-12-15 DIAGNOSIS — M359 Systemic involvement of connective tissue, unspecified: Secondary | ICD-10-CM

## 2022-12-15 LAB — IRON,TIBC AND FERRITIN PANEL
%SAT: 12 % — ABNORMAL LOW (ref 16–45)
Ferritin: 74 ng/mL (ref 16–232)
Iron: 35 ug/dL — ABNORMAL LOW (ref 40–190)
TIBC: 287 ug/dL (ref 250–450)

## 2022-12-15 LAB — CBC WITH DIFFERENTIAL/PLATELET
Absolute Lymphocytes: 1502 {cells}/uL (ref 850–3900)
Absolute Monocytes: 601 {cells}/uL (ref 200–950)
Basophils Absolute: 39 {cells}/uL (ref 0–200)
Basophils Relative: 0.5 %
Eosinophils Absolute: 270 {cells}/uL (ref 15–500)
Eosinophils Relative: 3.5 %
HCT: 38.1 % (ref 35.0–45.0)
Hemoglobin: 12 g/dL (ref 11.7–15.5)
MCH: 27 pg (ref 27.0–33.0)
MCHC: 31.5 g/dL — ABNORMAL LOW (ref 32.0–36.0)
MCV: 85.6 fL (ref 80.0–100.0)
MPV: 10 fL (ref 7.5–12.5)
Monocytes Relative: 7.8 %
Neutro Abs: 5290 {cells}/uL (ref 1500–7800)
Neutrophils Relative %: 68.7 %
Platelets: 337 10*3/uL (ref 140–400)
RBC: 4.45 10*6/uL (ref 3.80–5.10)
RDW: 12.5 % (ref 11.0–15.0)
Total Lymphocyte: 19.5 %
WBC: 7.7 10*3/uL (ref 3.8–10.8)

## 2022-12-15 LAB — COMPLETE METABOLIC PANEL WITH GFR
AG Ratio: 1.4 (calc) (ref 1.0–2.5)
ALT: 6 U/L (ref 6–29)
AST: 10 U/L (ref 10–35)
Albumin: 4 g/dL (ref 3.6–5.1)
Alkaline phosphatase (APISO): 59 U/L (ref 31–125)
BUN: 22 mg/dL (ref 7–25)
CO2: 28 mmol/L (ref 20–32)
Calcium: 9 mg/dL (ref 8.6–10.2)
Chloride: 105 mmol/L (ref 98–110)
Creat: 0.7 mg/dL (ref 0.50–0.99)
Globulin: 2.9 g/dL (ref 1.9–3.7)
Glucose, Bld: 91 mg/dL (ref 65–99)
Potassium: 4.1 mmol/L (ref 3.5–5.3)
Sodium: 140 mmol/L (ref 135–146)
Total Bilirubin: 0.3 mg/dL (ref 0.2–1.2)
Total Protein: 6.9 g/dL (ref 6.1–8.1)
eGFR: 106 mL/min/{1.73_m2} (ref >=60)

## 2022-12-15 LAB — VITAMIN D 25 HYDROXY (VIT D DEFICIENCY, FRACTURES): Vit D, 25-Hydroxy: 30 ng/mL (ref 30–100)

## 2022-12-15 LAB — URINALYSIS, ROUTINE W REFLEX MICROSCOPIC
Bilirubin Urine: NEGATIVE
Glucose, UA: NEGATIVE
Hyaline Cast: NONE SEEN /LPF
Ketones, ur: NEGATIVE
Nitrite: NEGATIVE
Specific Gravity, Urine: 1.025 (ref 1.001–1.035)
pH: <=5 (ref 5.0–8.0)

## 2022-12-15 LAB — ANTI-DNA ANTIBODY, DOUBLE-STRANDED: ds DNA Ab: 1 [IU]/mL

## 2022-12-15 LAB — PROTEIN / CREATININE RATIO, URINE
Creatinine, Urine: 103 mg/dL (ref 20–275)
Protein/Creat Ratio: 252 mg/g{creat} — ABNORMAL HIGH (ref 24–184)
Protein/Creatinine Ratio: 0.252 mg/mg{creat} — ABNORMAL HIGH (ref 0.024–0.184)
Total Protein, Urine: 26 mg/dL — ABNORMAL HIGH (ref 5–24)

## 2022-12-15 LAB — ANTI-NUCLEAR AB-TITER (ANA TITER): ANA Titer 1: 1:40 {titer} — ABNORMAL HIGH

## 2022-12-15 LAB — C3 AND C4
C3 Complement: 137 mg/dL (ref 83–193)
C4 Complement: 24 mg/dL (ref 15–57)

## 2022-12-15 LAB — MICROSCOPIC MESSAGE

## 2022-12-15 LAB — SEDIMENTATION RATE: Sed Rate: 28 mm/h — ABNORMAL HIGH (ref 0–20)

## 2022-12-15 LAB — TSH: TSH: 1.37 m[IU]/L

## 2022-12-15 LAB — VITAMIN B12: Vitamin B-12: 446 pg/mL (ref 200–1100)

## 2022-12-15 LAB — ANA: Anti Nuclear Antibody (ANA): POSITIVE — AB

## 2022-12-15 NOTE — Progress Notes (Signed)
UA shows elevated white cell count and few bacteria.  Patient should see her PCP to rule out UTI if symptomatic.

## 2022-12-15 NOTE — Progress Notes (Signed)
Iron and percent iron saturation is low.  Urine protein creatinine ratio is mildly elevated.  Sed rate is mildly elevated at 28.  Patient should have repeat protein creatinine ratio in couple of weeks.  CBC and CMP are stable.  ANA is low titer and not significant.  Double-stranded DNA is negative.  Complements are normal.  Labs do not indicate an autoimmune disease flare.  TSH is normal.  B12 normal, vitamin D low normal.  Patient should take vitamin D on a regular basis.  Please forward lab results to her PCP.

## 2022-12-15 NOTE — Telephone Encounter (Signed)
-----   Message from [facility] sent at 12/15/2022  3:35 PM EST ----- Iron and percent iron saturation is low.  Urine protein creatinine ratio is mildly elevated.  Sed rate is mildly elevated at 28.  Patient should have repeat protein creatinine ratio in couple of weeks.  CBC and CMP are stable.  ANA is low titer and n ot significant.  Double-stranded DNA is negative.  Complements are normal.  Labs do not indicate an autoimmune disease flare.  TSH is normal.  B12 normal, vitamin D low normal.  Patient should take vitamin D on a regular basis.  Please forward lab re sults to her PCP.

## 2023-01-02 ENCOUNTER — Other Ambulatory Visit: Payer: Self-pay

## 2023-01-02 DIAGNOSIS — Z79899 Other long term (current) drug therapy: Secondary | ICD-10-CM

## 2023-01-02 DIAGNOSIS — M359 Systemic involvement of connective tissue, unspecified: Secondary | ICD-10-CM

## 2023-01-03 LAB — PROTEIN / CREATININE RATIO, URINE
Creatinine, Urine: 104 mg/dL (ref 20–275)
Protein/Creat Ratio: 115 mg/g{creat} (ref 24–184)
Protein/Creatinine Ratio: 0.115 mg/mg{creat} (ref 0.024–0.184)
Total Protein, Urine: 12 mg/dL (ref 5–24)

## 2023-02-23 NOTE — Progress Notes (Signed)
 Office Visit Note  Patient: [name]             Date of Birth: 07-20-1975           MRN: [id number]             PCP: Stevphen Rochester, MD Referring: Devra Dopp, MD Visit Date: 03/09/2023 Occupation: @GUAROCC @  Subjective:  Medication management  History of Present Illness: [name] is a 48 y.o. female with autoimmune disease and osteoarthritis.  Patient denies having a flare of autoimmune disease in a long time.  She denies any history of oral ulcers, nasal ulcers, malar rash, photosensitivity, Raynaud's phenomenon, inflammatory arthritis.  She had been taking hydroxychloroquine 200 mg twice daily Monday to Friday on a regular basis.    Activities of Daily Living:  Patient reports morning stiffness for 5 minutes.   Patient Denies nocturnal pain.  Difficulty dressing/grooming: Denies Difficulty climbing stairs: Denies Difficulty getting out of chair: Denies Difficulty using hands for taps, buttons, cutlery, and/or writing: Reports  Review of Systems  Constitutional:  Positive for fatigue.  HENT:  Negative for mouth sores and mouth dryness.   Eyes:  Negative for dryness.  Respiratory:  Negative for shortness of breath.   Cardiovascular:  Negative for chest pain and palpitations.  Gastrointestinal:  Negative for blood in stool, constipation and diarrhea.  Endocrine: Negative for increased urination.  Genitourinary:  Negative for involuntary urination.  Musculoskeletal:  Positive for joint pain, joint pain and morning stiffness. Negative for gait problem, joint swelling, myalgias, muscle weakness, muscle tenderness and myalgias.  Skin:  Negative for color change, rash, hair loss and sensitivity to sunlight.  Allergic/Immunologic: Negative for susceptible to infections.  Neurological:  Positive for dizziness and headaches.  Hematological:  Negative for swollen glands.  Psychiatric/Behavioral:  Positive for depressed mood and sleep disturbance. The patient is  nervous/anxious.     PMFS History:  Patient Active Problem List   Diagnosis Date Noted   Primary insomnia 04/04/2017   Autoimmune disease (HCC)positive ANA, positive Ro and positive La antibody. malar rash, history of oral ulcers and sicca symptoms 07/22/2016   High risk medication use 07/22/2016   History of hypothyroidism 07/22/2016   History of gastroesophageal reflux (GERD) 07/22/2016   Osteopenia of multiple sites 07/22/2016   History of depression 07/22/2016    Past Medical History:  Diagnosis Date   Abnormal Pap smear 1996   Asthma    Dyspareunia 2002   Endometriosis 2004   H/O amenorrhea 2009   H/O candidiasis    H/O cervicitis 2002   H/O dysmenorrhea 2004   H/O tinea cruris 2002   H/O varicella    H/O vulvovaginitis 2002   H/O: menorrhagia 2003   Hx: UTI (urinary tract infection)    Hypothyroidism    Libido, decreased 2002   Menses, irregular 2004   Ovarian cyst, left 2008   PCB (post coital bleeding) 2003   Pelvic pain in female 2007    Family History  Problem Relation Age of Onset   Asthma Mother    Diabetes Mother    Depression Mother    Asthma Father    Heart disease Brother        Heart Murmur   Arthritis Maternal Grandmother    Cancer Maternal Grandmother        Breast & liver   Depression Maternal Grandmother    Heart disease Maternal Grandfather        MI   Asthma Maternal  Grandfather    Diabetes Maternal Grandfather    Kidney disease Maternal Grandfather        Dialysis   Past Surgical History:  Procedure Laterality Date   BREAST SURGERY     COLONOSCOPY  12/2019   GASTRIC     NOVASURE ABLATION     TUBAL LIGATION     UPPER GI ENDOSCOPY  12/2019   WISDOM TOOTH EXTRACTION  [id number]   Social History   Social History Narrative   Not on file   Immunization History  Administered Date(s) Administered   [employer] Vaccination 09/17/2019, 10/15/2019     Objective: Vital Signs: BP 130/83 (BP Location: Right Arm, Patient  Position: Sitting, Cuff Size: Large)   Pulse 84   Resp 14   Ht 5\' 1"  (1.549 m)   Wt 221 lb (100.2 kg)   BMI 41.76 kg/m    Physical Exam Vitals and nursing note reviewed.  Constitutional:      Appearance: She is well-developed.  HENT:     Head: Normocephalic and atraumatic.  Eyes:     Conjunctiva/sclera: Conjunctivae normal.  Cardiovascular:     Rate and Rhythm: Normal rate and regular rhythm.     Heart sounds: Normal heart sounds.  Pulmonary:     Effort: Pulmonary effort is normal.     Breath sounds: Normal breath sounds.  Abdominal:     General: Bowel sounds are normal.     Palpations: Abdomen is soft.  Musculoskeletal:     Cervical back: Normal range of motion.  Lymphadenopathy:     Cervical: No cervical adenopathy.  Skin:    General: Skin is warm and dry.     Capillary Refill: Capillary refill takes less than 2 seconds.  Neurological:     Mental Status: She is alert and oriented to person, place, and time.  Psychiatric:        Behavior: Behavior normal.      Musculoskeletal Exam: Cervical, thoracic and lumbar spine were in good range of motion.  Shoulders, elbows, wrist joints, MCPs PIPs and DIPs Juengel range of motion with no synovitis.  Hip joints and knee joints with good range of motion without any warmth swelling or effusion.  There was no tenderness over ankles or MTPs.  CDAI Exam: CDAI Score: -- Patient Global: --; Provider Global: -- Swollen: --; Tender: -- Joint Exam 03/09/2023   No joint exam has been documented for this visit   There is currently no information documented on the homunculus. Go to the Rheumatology activity and complete the homunculus joint exam.  Investigation: No additional findings.  Imaging: No results found.  Recent Labs: Lab Results  Component Value Date   WBC 7.7 12/13/2022   HGB 12.0 12/13/2022   PLT 337 12/13/2022   NA 140 12/13/2022   K 4.1 12/13/2022   CL 105 12/13/2022   CO2 28 12/13/2022   GLUCOSE 91  12/13/2022   BUN 22 12/13/2022   CREATININE 0.70 12/13/2022   BILITOT 0.3 12/13/2022   ALKPHOS 43 11/02/2020   AST 10 12/13/2022   ALT 6 12/13/2022   PROT 6.9 12/13/2022   ALBUMIN 4.0 11/02/2020   CALCIUM 9.0 12/13/2022   GFRAA 118 03/31/2020    Speciality Comments: PLQ eye exam: 11/24/2022  WNL. [facility]. Follow up in 1 year Plaquenil started October 14, 2013  Procedures:  No procedures performed Allergies: Sulfa drugs cross reactors   Assessment / Plan:     Visit Diagnoses: Autoimmune disease (HCC) -  positive ANA, positive Ro and positive La antibody. malar rash, history of oral ulcers and sicca symptoms: -Patient has been asymptomatic as regards to autoimmune disease in a while.  No synovitis was noted on the examination.  She denies any sicca symptoms.  There is no history of oral ulcers, nasal ulcers, malar rash, photosensitivity or Raynaud's.  I will check labs in May.  If her labs stayed stable he may taper Plaquenil to 1 tablet p.o. daily.  Patient was in agreement.  Plan: Protein / creatinine ratio, urine, Anti-DNA antibody, double-stranded, C3 and C4, Sedimentation rate, ANA, Sjogrens syndrome-A extractable nuclear antibody in May  High risk medication use - Plaquenil 200 mg 1 tablet twice daily Monday through Friday. PLQ eye exam: 11/24/2022 - Plan: CBC with Differential/Platelet, COMPLETE METABOLIC PANEL WITH GFR in May  Primary osteoarthritis of both knees-she denies any discomfort.  No warmth swelling or effusion was noted.  Osteopenia of multiple sites - DEXA updated on 08/07/2019: LFN BMD 0.735 with T-score -2.2. History of gastric gastrectomy.  Patient is followed by [facility] endocrinology.  She will have repeat DEXA scan through [facility].  Primary insomnia-sleep hygiene was discussed.  Other fatigue-related to insomnia.  Other medical problems are listed as follows:  History of gastroesophageal reflux (GERD)  History of hypothyroidism  Iron deficiency  anemia following bariatric surgery  History of depression  Orders: Orders Placed This Encounter  Procedures   Protein / creatinine ratio, urine   CBC with Differential/Platelet   COMPLETE METABOLIC PANEL WITH GFR   Anti-DNA antibody, double-stranded   C3 and C4   Sedimentation rate   ANA   Sjogrens syndrome-A extractable nuclear antibody   No orders of the defined types were placed in this encounter.    Follow-Up Instructions: Return in about 5 months (around 08/09/2023) for Autoimmune disease, Osteoarthritis.   Pollyann Savoy, MD  Note - This record has been created using Animal nutritionist.  Chart creation errors have been sought, but may not always  have been located. Such creation errors do not reflect on  the standard of medical care.

## 2023-03-09 ENCOUNTER — Ambulatory Visit: Payer: Managed Care, Other (non HMO) | Attending: Rheumatology | Admitting: Rheumatology

## 2023-03-09 ENCOUNTER — Encounter: Payer: Self-pay | Admitting: Rheumatology

## 2023-03-09 VITALS — BP 130/83 | HR 84 | Resp 14 | Ht 61.0 in | Wt 221.0 lb

## 2023-03-09 DIAGNOSIS — K9589 Other complications of other bariatric procedure: Secondary | ICD-10-CM

## 2023-03-09 DIAGNOSIS — F5101 Primary insomnia: Secondary | ICD-10-CM

## 2023-03-09 DIAGNOSIS — Z8719 Personal history of other diseases of the digestive system: Secondary | ICD-10-CM

## 2023-03-09 DIAGNOSIS — Z8639 Personal history of other endocrine, nutritional and metabolic disease: Secondary | ICD-10-CM

## 2023-03-09 DIAGNOSIS — R5383 Other fatigue: Secondary | ICD-10-CM

## 2023-03-09 DIAGNOSIS — D508 Other iron deficiency anemias: Secondary | ICD-10-CM

## 2023-03-09 DIAGNOSIS — Z8659 Personal history of other mental and behavioral disorders: Secondary | ICD-10-CM

## 2023-03-09 DIAGNOSIS — M170 Bilateral primary osteoarthritis of knee: Secondary | ICD-10-CM

## 2023-03-09 DIAGNOSIS — Z79899 Other long term (current) drug therapy: Secondary | ICD-10-CM

## 2023-03-09 DIAGNOSIS — M359 Systemic involvement of connective tissue, unspecified: Secondary | ICD-10-CM | POA: Diagnosis not present

## 2023-03-09 DIAGNOSIS — M8589 Other specified disorders of bone density and structure, multiple sites: Secondary | ICD-10-CM | POA: Diagnosis not present

## 2023-03-09 DIAGNOSIS — M7061 Trochanteric bursitis, right hip: Secondary | ICD-10-CM

## 2023-03-09 NOTE — Patient Instructions (Signed)
 Vaccines You are taking a medication(s) that can suppress your immune system.  The following immunizations are recommended: Flu annually Covid-19  Td/Tdap (tetanus, diphtheria, pertussis) every 10 years Pneumonia (Prevnar 15 then Pneumovax 23 at least 1 year apart.  Alternatively, can take Prevnar 20 without needing additional dose) Shingrix: 2 doses from 4 weeks to 6 months apart  Please check with your PCP to make sure you are up to date.

## 2023-03-20 ENCOUNTER — Other Ambulatory Visit: Payer: Self-pay | Admitting: Physician Assistant

## 2023-03-20 DIAGNOSIS — M359 Systemic involvement of connective tissue, unspecified: Secondary | ICD-10-CM

## 2023-03-21 NOTE — Telephone Encounter (Signed)
 Last Fill: 08/15/2022  Eye exam: 11/24/2022   Labs: 12/13/2022 Iron and percent iron saturation is low.  Urine protein creatinine ratio is mildly elevated.  Sed rate is mildly elevated at 28.  Patient should have repeat protein creatinine ratio in couple of weeks.  CBC and CMP are stable.  ANA is low titer and not significant.  Double-stranded DNA is negative.  Complements are normal.  Labs do not indicate an autoimmune disease flare.  TSH is normal.  B12 normal, vitamin D low normal.  Patient should take vitamin D on a regular basis.  Please forward lab results to her PCP. UA shows elevated white cell count and few bacteria.  Patient should see her PCP to rule out UTI if symptomatic.   Next Visit: 08/09/2023  Last Visit: 03/09/2023  [id number] disease   Current Dose per office note 03/09/2023: Plaquenil 200 mg 1 tablet twice daily Monday through Friday   Okay to refill Plaquenil?

## 2023-06-13 ENCOUNTER — Other Ambulatory Visit: Payer: Self-pay | Admitting: Physician Assistant

## 2023-06-13 DIAGNOSIS — M359 Systemic involvement of connective tissue, unspecified: Secondary | ICD-10-CM

## 2023-06-13 NOTE — Telephone Encounter (Signed)
 Last Fill: 03/21/2023  Eye exam: 11/24/2022  WNL   Labs: 12/13/2022 Iron and percent iron saturation is low.  Urine protein creatinine ratio is mildly elevated.  Sed rate is mildly elevated at 28.  Patient should have repeat protein creatinine ratio in couple of weeks.  CBC and CMP are stable.  ANA is low titer and not significant.  Double-stranded DNA is negative.  Complements are normal.  Labs do not indicate an autoimmune disease flare.  TSH is normal.  B12 normal, vitamin D  low normal.  Patient should take vitamin D  on a regular basis.   Contacted patient to update labs she stated it would be at least 2 weeks before she can get those updated but she will get it done  Next Visit: 08/09/2023  Last Visit: 03/09/2023  [id number] disease   Current Dose per office note 03/09/2023: Plaquenil  200 mg 1 tablet twice daily Monday through Friday.   Okay to refill Plaquenil ?

## 2023-07-25 ENCOUNTER — Other Ambulatory Visit: Payer: Self-pay | Admitting: *Deleted

## 2023-07-25 DIAGNOSIS — Z79899 Other long term (current) drug therapy: Secondary | ICD-10-CM

## 2023-07-25 DIAGNOSIS — M359 Systemic involvement of connective tissue, unspecified: Secondary | ICD-10-CM

## 2023-07-26 NOTE — Progress Notes (Deleted)
 Office Visit Note  Patient: [name]             Date of Birth: [birth year]           MRN: [id number]             PCP: Gladystine Erminio CROME, MD Referring: Gladystine Erminio CROME, MD Visit Date: 08/09/2023 Occupation: @GUAROCC @  Subjective:  No chief complaint on file.   History of Present Illness: [name] is a 48 y.o. female ***     Activities of Daily Living:  Patient reports morning stiffness for *** {minute/hour:19697}.   Patient {ACTIONS;DENIES/REPORTS:21021675::Denies} nocturnal pain.  Difficulty dressing/grooming: {ACTIONS;DENIES/REPORTS:21021675::Denies} Difficulty climbing stairs: {ACTIONS;DENIES/REPORTS:21021675::Denies} Difficulty getting out of chair: {ACTIONS;DENIES/REPORTS:21021675::Denies} Difficulty using hands for taps, buttons, cutlery, and/or writing: {ACTIONS;DENIES/REPORTS:21021675::Denies}  No Rheumatology ROS completed.   PMFS History:  Patient Active Problem List   Diagnosis Date Noted   Primary insomnia 04/04/2017   Autoimmune disease (HCC)positive ANA, positive Ro and positive La antibody. malar rash, history of oral ulcers and sicca symptoms 07/22/2016   High risk medication use 07/22/2016   History of hypothyroidism 07/22/2016   History of gastroesophageal reflux (GERD) 07/22/2016   Osteopenia of multiple sites 07/22/2016   History of depression 07/22/2016    Past Medical History:  Diagnosis Date   Abnormal Pap smear 1996   Asthma    Dyspareunia 2002   Endometriosis 2004   H/O amenorrhea 2009   H/O candidiasis    H/O cervicitis 2002   H/O dysmenorrhea 2004   H/O tinea cruris 2002   H/O varicella    H/O vulvovaginitis 2002   H/O: menorrhagia 2003   Hx: UTI (urinary tract infection)    Hypothyroidism    Libido, decreased 2002   Menses, irregular 2004   Ovarian cyst, left 2008   PCB (post coital bleeding) 2003   Pelvic pain in female 2007    Family History  Problem Relation Age of Onset   Asthma Mother     Diabetes Mother    Depression Mother    Asthma Father    Heart disease Brother        Heart Murmur   Arthritis Maternal Grandmother    Cancer Maternal Grandmother        Breast & liver   Depression Maternal Grandmother    Heart disease Maternal Grandfather        MI   Asthma Maternal Grandfather    Diabetes Maternal Grandfather    Kidney disease Maternal Grandfather        Dialysis   Past Surgical History:  Procedure Laterality Date   BREAST SURGERY     COLONOSCOPY  12/2019   GASTRIC     NOVASURE ABLATION     TUBAL LIGATION     UPPER GI ENDOSCOPY  12/2019   WISDOM TOOTH EXTRACTION  8008&8001   Social History   Social History Narrative   Not on file   Immunization History  Administered Date(s) Administered   Moderna Sars-Covid-2 Vaccination 09/17/2019, 10/15/2019     Objective: Vital Signs: There were no vitals taken for this visit.   Physical Exam   Musculoskeletal Exam: ***  CDAI Exam: CDAI Score: -- Patient Global: --; Provider Global: -- Swollen: --; Tender: -- Joint Exam 08/09/2023   No joint exam has been documented for this visit   There is currently no information documented on the homunculus. Go to the Rheumatology activity and complete the homunculus joint exam.  Investigation: No additional findings.  Imaging: No  results found.  Recent Labs: Lab Results  Component Value Date   WBC 5.9 07/25/2023   HGB 12.3 07/25/2023   PLT 280 07/25/2023   NA 140 07/25/2023   K 4.2 07/25/2023   CL 106 07/25/2023   CO2 27 07/25/2023   GLUCOSE 78 07/25/2023   BUN 18 07/25/2023   CREATININE 0.69 07/25/2023   BILITOT 0.2 07/25/2023   ALKPHOS 43 11/02/2020   AST 10 07/25/2023   ALT 6 07/25/2023   PROT 6.6 07/25/2023   ALBUMIN 4.0 11/02/2020   CALCIUM 8.8 07/25/2023   GFRAA 118 03/31/2020    Speciality Comments: PLQ eye exam: 11/24/2022  WNL. [facility]. Follow up in 1 year Plaquenil  started October 14, 2013  Procedures:  No procedures  performed Allergies: Sulfa drugs cross reactors   Assessment / Plan:     Visit Diagnoses: Autoimmune disease (HCC)  High risk medication use  Primary osteoarthritis of both knees  Osteopenia of multiple sites  Primary insomnia  Other fatigue  Trochanteric bursitis, right hip  History of gastroesophageal reflux (GERD)  History of hypothyroidism  Iron deficiency anemia following bariatric surgery  History of depression  Orders: No orders of the defined types were placed in this encounter.  No orders of the defined types were placed in this encounter.   Face-to-face time spent with patient was *** minutes. Greater than 50% of time was spent in counseling and coordination of care.  Follow-Up Instructions: No follow-ups on file.   Waddell CHRISTELLA Craze, PA-C  Note - This record has been created using Dragon software.  Chart creation errors have been sought, but may not always  have been located. Such creation errors do not reflect on  the standard of medical care.

## 2023-07-27 ENCOUNTER — Other Ambulatory Visit: Payer: Self-pay | Admitting: *Deleted

## 2023-07-27 ENCOUNTER — Ambulatory Visit: Payer: Self-pay | Admitting: Rheumatology

## 2023-07-27 DIAGNOSIS — M359 Systemic involvement of connective tissue, unspecified: Secondary | ICD-10-CM

## 2023-07-27 LAB — COMPREHENSIVE METABOLIC PANEL WITH GFR
AG Ratio: 1.6 (calc) (ref 1.0–2.5)
ALT: 6 U/L (ref 6–29)
AST: 10 U/L (ref 10–35)
Albumin: 4.1 g/dL (ref 3.6–5.1)
Alkaline phosphatase (APISO): 61 U/L (ref 37–153)
BUN: 18 mg/dL (ref 7–25)
CO2: 27 mmol/L (ref 20–32)
Calcium: 8.8 mg/dL (ref 8.6–10.4)
Chloride: 106 mmol/L (ref 98–110)
Creat: 0.69 mg/dL (ref 0.50–1.03)
Globulin: 2.5 g/dL (ref 1.9–3.7)
Glucose, Bld: 78 mg/dL (ref 65–99)
Potassium: 4.2 mmol/L (ref 3.5–5.3)
Sodium: 140 mmol/L (ref 135–146)
Total Bilirubin: 0.2 mg/dL (ref 0.2–1.2)
Total Protein: 6.6 g/dL (ref 6.1–8.1)
eGFR: 106 mL/min/1.73m2 (ref >=60)

## 2023-07-27 LAB — CBC WITH DIFFERENTIAL/PLATELET
Absolute Lymphocytes: 1115 {cells}/uL (ref 850–3900)
Absolute Monocytes: 407 {cells}/uL (ref 200–950)
Basophils Absolute: 41 {cells}/uL (ref 0–200)
Basophils Relative: 0.7 %
Eosinophils Absolute: 230 {cells}/uL (ref 15–500)
Eosinophils Relative: 3.9 %
HCT: 39.4 % (ref 35.0–45.0)
Hemoglobin: 12.3 g/dL (ref 11.7–15.5)
MCH: 27.8 pg (ref 27.0–33.0)
MCHC: 31.2 g/dL — ABNORMAL LOW (ref 32.0–36.0)
MCV: 88.9 fL (ref 80.0–100.0)
MPV: 10.2 fL (ref 7.5–12.5)
Monocytes Relative: 6.9 %
Neutro Abs: 4106 {cells}/uL (ref 1500–7800)
Neutrophils Relative %: 69.6 %
Platelets: 280 Thousand/uL (ref 140–400)
RBC: 4.43 Million/uL (ref 3.80–5.10)
RDW: 13.1 % (ref 11.0–15.0)
Total Lymphocyte: 18.9 %
WBC: 5.9 Thousand/uL (ref 3.8–10.8)

## 2023-07-27 LAB — ANA: Anti Nuclear Antibody (ANA): NEGATIVE

## 2023-07-27 LAB — SEDIMENTATION RATE: Sed Rate: 17 mm/h (ref 0–20)

## 2023-07-27 LAB — PROTEIN / CREATININE RATIO, URINE
Creatinine, Urine: 105 mg/dL (ref 20–275)
Protein/Creat Ratio: 152 mg/g{creat} (ref 24–184)
Protein/Creatinine Ratio: 0.152 mg/mg{creat} (ref 0.024–0.184)
Total Protein, Urine: 16 mg/dL (ref 5–24)

## 2023-07-27 LAB — C3 AND C4
C3 Complement: 144 mg/dL (ref 83–193)
C4 Complement: 26 mg/dL (ref 15–57)

## 2023-07-27 LAB — ANTI-DNA ANTIBODY, DOUBLE-STRANDED: ds DNA Ab: 1 [IU]/mL

## 2023-07-27 LAB — SJOGRENS SYNDROME-A EXTRACTABLE NUCLEAR ANTIBODY: SSA (Ro) (ENA) Antibody, IgG: <1 AI

## 2023-07-27 MED ORDER — HYDROXYCHLOROQUINE SULFATE 200 MG PO TABS: ORAL_TABLET | ORAL | 0 refills | Status: DC

## 2023-07-27 NOTE — Progress Notes (Signed)
 CBC and CMP are normal, SSA negative, sed rate normal, complements normal, double-stranded ENA negative, urine protein creatinine ratio normal, ANA pending

## 2023-07-27 NOTE — Telephone Encounter (Signed)
 Last Fill: 06/13/2023  Eye exam: 11/24/2022   Labs: 07/25/2023  CBC and CMP are normal, SSA negative, sed rate normal, complements normal, double-stranded ENA negative, urine protein creatinine ratio normal, ANA pending   Next Visit: 08/09/2023  Last Visit: 03/09/2023  IK:Jlunpfflwz disease   Current Dose per office note 03/09/2023: Plaquenil  200 mg 1 tablet twice daily Monday through Friday   Okay to refill Plaquenil ?

## 2023-07-27 NOTE — Progress Notes (Signed)
 ANA negative

## 2023-08-08 NOTE — Progress Notes (Signed)
 Office Visit Note  Patient: [name]             Date of Birth: Feb 08, 1975           MRN: [id number]             PCP: Gladystine Erminio CROME, MD Referring: Gladystine Erminio CROME, MD Visit Date: 08/15/2023 Occupation: @GUAROCC @  Subjective:  Discuss lab results   History of Present Illness: [name] is a 48 y.o. female with history of autoimmune disease and osteoarthritis.  Patient remains on Plaquenil  200 mg 1 tablet twice daily Monday through Friday.  She is tolerating Plaquenil  without any side effects and has not had any recent gaps in therapy.  She denies any signs or symptoms of autoimmune disease flare.  Patient continues to experience fatigue and has interrupted sleep at night.  She denies any recent rashes, Raynaud's phenomenon, oral nasal ulcerations, or sicca symptoms.  She denies any increased joint pain or joint swelling.  She denies any new medical conditions.  She denies any recent or recurrent infections.     Activities of Daily Living:  Patient reports morning stiffness for 5 minutes  Patient nocturnal pain.  Difficulty dressing/grooming:  Difficulty climbing stairs:  Difficulty getting out of chair:  Difficulty using hands for taps, buttons, cutlery, and/or writing:   Review of Systems  Constitutional:  Positive for fatigue.  HENT:  Negative for mouth sores and mouth dryness.   Eyes:  Negative for dryness.  Respiratory:  Negative for shortness of breath.   Cardiovascular:  Negative for chest pain and palpitations.  Gastrointestinal:  Negative for blood in stool, constipation and diarrhea.  Endocrine: Negative for increased urination.  Genitourinary:  Negative for involuntary urination.  Musculoskeletal:  Positive for joint pain and joint pain. Negative for gait problem, joint swelling, myalgias, muscle weakness, morning stiffness, muscle tenderness and myalgias.  Skin:  Positive for sensitivity to sunlight. Negative for color change, rash and hair loss.   Allergic/Immunologic: Negative for susceptible to infections.  Neurological:  Positive for headaches. Negative for dizziness.  Hematological:  Negative for swollen glands.  Psychiatric/Behavioral:  Positive for sleep disturbance. Negative for depressed mood. The patient is not nervous/anxious.     PMFS History:  Patient Active Problem List   Diagnosis Date Noted   Primary insomnia 04/04/2017   Autoimmune disease (HCC)positive ANA, positive Ro and positive La antibody. malar rash, history of oral ulcers and sicca symptoms 07/22/2016   High risk medication use 07/22/2016   History of hypothyroidism 07/22/2016   History of gastroesophageal reflux (GERD) 07/22/2016   Osteopenia of multiple sites 07/22/2016   History of depression 07/22/2016    Past Medical History:  Diagnosis Date   Abnormal Pap smear 1996   Asthma    Dyspareunia 2002   Endometriosis 2004   H/O amenorrhea 2009   H/O candidiasis    H/O cervicitis 2002   H/O dysmenorrhea 2004   H/O tinea cruris 2002   H/O varicella    H/O vulvovaginitis 2002   H/O: menorrhagia 2003   Hx: UTI (urinary tract infection)    Hypothyroidism    Libido, decreased 2002   Menses, irregular 2004   Ovarian cyst, left 2008   PCB (post coital bleeding) 2003   Pelvic pain in female 2007    Family History  Problem Relation Age of Onset   Asthma Mother    Diabetes Mother    Depression Mother    Asthma Father    Heart  disease Brother        Heart Murmur   Arthritis Maternal Grandmother    Cancer Maternal Grandmother        Breast & liver   Depression Maternal Grandmother    Heart disease Maternal Grandfather        MI   Asthma Maternal Grandfather    Diabetes Maternal Grandfather    Kidney disease Maternal Grandfather        Dialysis   Past Surgical History:  Procedure Laterality Date   BREAST SURGERY     COLONOSCOPY  12/2019   GASTRIC     NOVASURE ABLATION     TUBAL LIGATION     UPPER GI ENDOSCOPY  12/2019   WISDOM TOOTH  EXTRACTION  8008&8001   Social History   Social History Narrative   Not on file   Immunization History  Administered Date(s) Administered   Moderna Sars-Covid-2 Vaccination 09/17/2019, 10/15/2019     Objective: Vital Signs: BP 118/79 (BP Location: Left Arm, Patient Position: Sitting, Cuff Size: Normal)   Pulse 67   Resp 16   Ht 5' 1 (1.549 m)   Wt 205 lb 9.6 oz (93.3 kg)   BMI 38.85 kg/m    Physical Exam Vitals and nursing note reviewed.  Constitutional:      Appearance: She is well-developed.  HENT:     Head: Normocephalic and atraumatic.  Eyes:     Conjunctiva/sclera: Conjunctivae normal.  Cardiovascular:     Rate and Rhythm: Normal rate and regular rhythm.     Heart sounds: Normal heart sounds.  Pulmonary:     Effort: Pulmonary effort is normal.     Breath sounds: Normal breath sounds.  Abdominal:     General: Bowel sounds are normal.     Palpations: Abdomen is soft.  Musculoskeletal:     Cervical back: Normal range of motion.  Lymphadenopathy:     Cervical: No cervical adenopathy.  Skin:    General: Skin is warm and dry.     Capillary Refill: Capillary refill takes less than 2 seconds.  Neurological:     Mental Status: She is alert and oriented to person, place, and time.  Psychiatric:        Behavior: Behavior normal.      Musculoskeletal Exam: C-spine, thoracic spine, lumbar spine good range of motion.  Shoulder joints, elbow joints, wrist joints, MCPs, PIPs, DIPs have been range of motion with no synovitis.  Complete fist formation bilaterally.  Hip joints have good range of motion with no groin pain.  Knee joints have good range of motion no warmth or effusion.  Ankle joints have good range of motion with no tenderness or joint swelling.  CDAI Exam: CDAI Score: -- Patient Global: --; Provider Global: -- Swollen: --; Tender: -- Joint Exam 08/15/2023   No joint exam has been documented for this visit   There is currently no information documented  on the homunculus. Go to the Rheumatology activity and complete the homunculus joint exam.  Investigation: No additional findings.  Imaging: No results found.  Recent Labs: Lab Results  Component Value Date   WBC 5.9 07/25/2023   HGB 12.3 07/25/2023   PLT 280 07/25/2023   NA 140 07/25/2023   K 4.2 07/25/2023   CL 106 07/25/2023   CO2 27 07/25/2023   GLUCOSE 78 07/25/2023   BUN 18 07/25/2023   CREATININE 0.69 07/25/2023   BILITOT 0.2 07/25/2023   ALKPHOS 43 11/02/2020   AST 10 07/25/2023  ALT 6 07/25/2023   PROT 6.6 07/25/2023   ALBUMIN 4.0 11/02/2020   CALCIUM 8.8 07/25/2023   GFRAA 118 03/31/2020    Speciality Comments: PLQ eye exam: 11/24/2022  WNL. [facility]. Follow up in 1 year Plaquenil  started October 14, 2013  Procedures:  No procedures performed Allergies: Sulfa drugs cross reactors    Assessment / Plan:     Visit Diagnoses: Autoimmune disease (HCC) - positive ANA, positive Ro and positive La antibody. malar rash, history of oral ulcers and sicca symptoms: She has not had any signs or symptoms of a flare.  She has clinically been doing well taking Plaquenil  200 mg 1 tablet by mouth twice daily Monday through Friday.  She is tolerating Plaquenil  without any side effects and has not had any gaps in therapy.  She continues to experience persistent fatigue but has not had any symptoms of a flare.  She has no synovitis on examination today.  No oral or nasal ulcerations.  No sicca symptoms.  No Raynaud's phenomenon.  No recent rashes.  No Malar rash noted. Lab work from 07/25/23 was reviewed today in the office: ANA-, Ro-, dsDNA negative, complements WNL, and ESR WNL.  She will continue to require updated lab work every 5 months--Future orders will be placed today. She will remain on Plaquenil  as monotherapy.  She was advised to notify us  if she develops any signs or symptoms of a flare.  She will follow-up in the office in 5 months or sooner if needed.  High  risk medication use - Plaquenil  200 mg 1 tablet twice daily Monday through Friday.  PLQ eye exam: 11/24/2022 WNL. [facility]. Follow up in 1 year  CBC and CMP updated on 07/25/23.    Primary osteoarthritis of both knees: She has good range of motion of both knee joints on examination today.  No warmth or effusion noted.  Osteopenia of multiple sites - DEXA 08/07/2019: LFN BMD 0.735 with T-score -2.2. History of gastric gastrectomy.   DEXA updated on 08/27/2021: Osteopenia.  Patient is followed by [facility] endocrinology. Due to update DEXA this month.   Primary insomnia: Patient has occasional difficulty sleeping at night contributing to her level of fatigue throughout the day.  Other fatigue: Chronic, stable.  Other medical conditions are listed as follows:  History of gastroesophageal reflux (GERD)  History of hypothyroidism  Iron deficiency anemia following bariatric surgery  History of depression  Orders: Orders Placed This Encounter  Procedures   Protein / creatinine ratio, urine   CBC with Differential/Platelet   C3 and C4   Sedimentation rate   Comprehensive metabolic panel with GFR   Anti-DNA antibody, double-stranded   No orders of the defined types were placed in this encounter.    Follow-Up Instructions: Return in about 5 months (around 01/15/2024).   Waddell CHRISTELLA Craze, PA-C  Note - This record has been created using Dragon software.  Chart creation errors have been sought, but may not always  have been located. Such creation errors do not reflect on  the standard of medical care.

## 2023-08-09 ENCOUNTER — Ambulatory Visit: Admitting: Physician Assistant

## 2023-08-09 DIAGNOSIS — K9589 Other complications of other bariatric procedure: Secondary | ICD-10-CM

## 2023-08-09 DIAGNOSIS — M170 Bilateral primary osteoarthritis of knee: Secondary | ICD-10-CM

## 2023-08-09 DIAGNOSIS — M359 Systemic involvement of connective tissue, unspecified: Secondary | ICD-10-CM

## 2023-08-09 DIAGNOSIS — Z79899 Other long term (current) drug therapy: Secondary | ICD-10-CM

## 2023-08-09 DIAGNOSIS — Z8659 Personal history of other mental and behavioral disorders: Secondary | ICD-10-CM

## 2023-08-09 DIAGNOSIS — M7061 Trochanteric bursitis, right hip: Secondary | ICD-10-CM

## 2023-08-09 DIAGNOSIS — Z8639 Personal history of other endocrine, nutritional and metabolic disease: Secondary | ICD-10-CM

## 2023-08-09 DIAGNOSIS — M8589 Other specified disorders of bone density and structure, multiple sites: Secondary | ICD-10-CM

## 2023-08-09 DIAGNOSIS — Z8719 Personal history of other diseases of the digestive system: Secondary | ICD-10-CM

## 2023-08-09 DIAGNOSIS — F5101 Primary insomnia: Secondary | ICD-10-CM

## 2023-08-09 DIAGNOSIS — R5383 Other fatigue: Secondary | ICD-10-CM

## 2023-08-15 ENCOUNTER — Encounter: Payer: Self-pay | Admitting: Physician Assistant

## 2023-08-15 ENCOUNTER — Ambulatory Visit: Attending: Physician Assistant | Admitting: Physician Assistant

## 2023-08-15 VITALS — BP 118/79 | HR 67 | Resp 16 | Ht 61.0 in | Wt 205.6 lb

## 2023-08-15 DIAGNOSIS — Z8639 Personal history of other endocrine, nutritional and metabolic disease: Secondary | ICD-10-CM

## 2023-08-15 DIAGNOSIS — M8589 Other specified disorders of bone density and structure, multiple sites: Secondary | ICD-10-CM

## 2023-08-15 DIAGNOSIS — Z79899 Other long term (current) drug therapy: Secondary | ICD-10-CM

## 2023-08-15 DIAGNOSIS — Z8719 Personal history of other diseases of the digestive system: Secondary | ICD-10-CM

## 2023-08-15 DIAGNOSIS — K9589 Other complications of other bariatric procedure: Secondary | ICD-10-CM

## 2023-08-15 DIAGNOSIS — F5101 Primary insomnia: Secondary | ICD-10-CM

## 2023-08-15 DIAGNOSIS — M359 Systemic involvement of connective tissue, unspecified: Secondary | ICD-10-CM

## 2023-08-15 DIAGNOSIS — R5383 Other fatigue: Secondary | ICD-10-CM

## 2023-08-15 DIAGNOSIS — M170 Bilateral primary osteoarthritis of knee: Secondary | ICD-10-CM | POA: Diagnosis not present

## 2023-08-15 DIAGNOSIS — D508 Other iron deficiency anemias: Secondary | ICD-10-CM

## 2023-08-15 DIAGNOSIS — Z8659 Personal history of other mental and behavioral disorders: Secondary | ICD-10-CM

## 2023-10-17 ENCOUNTER — Ambulatory Visit: Admitting: Physician Assistant

## 2023-10-17 ENCOUNTER — Other Ambulatory Visit: Payer: Self-pay

## 2023-10-17 DIAGNOSIS — M25511 Pain in right shoulder: Secondary | ICD-10-CM | POA: Diagnosis not present

## 2023-10-17 MED ORDER — LIDOCAINE HCL 1 % IJ SOLN
5.0000 mL | INTRAMUSCULAR | Status: AC | PRN
  Administered 2023-10-17: 5 mL

## 2023-10-17 MED ORDER — METHYLPREDNISOLONE ACETATE 40 MG/ML IJ SUSP
40.0000 mg | INTRAMUSCULAR | Status: AC | PRN
  Administered 2023-10-17: 40 mg via INTRA_ARTICULAR

## 2023-10-17 NOTE — Progress Notes (Signed)
 Office Visit Note   Patient: [name]           Date of Birth: Nov 03, 1975           MRN: [id number] Visit Date: 10/17/2023              Requested by: Gladystine Erminio CROME, MD [address] [place],  [place] [address] PCP: Gladystine Erminio CROME, MD   Assessment & Plan: Visit Diagnoses:  1. Acute pain of right shoulder     Plan: Findings consistent with rotator cuff impingement findings.  Talked her about the natural history of this she can take Tylenol .  I will give her some exercises to do talked about going forward with injection she would like to do that today may follow-up if no improvement  Follow-Up Instructions: No follow-ups on file.   Orders:  Orders Placed This Encounter  Procedures   XR Shoulder Right   No orders of the defined types were placed in this encounter.     Procedures: Large Joint Inj: R subacromial bursa on 10/17/2023 4:16 PM Indications: diagnostic evaluation and pain Details: 25 G 1.5 in needle, posterior approach  Arthrogram: No  Medications: 5 mL lidocaine  1 %; 40 mg methylPREDNISolone acetate 40 MG/ML Outcome: tolerated well, no immediate complications Procedure, treatment alternatives, risks and benefits explained, specific risks discussed. Consent was given by the patient.       Clinical Data: No additional findings.   Subjective: Chief Complaint  Patient presents with   Right Shoulder - Pain    HPI patient is a pleasant 48 year old woman comes in today with a chief complaint of right shoulder pain she was reaching up to grab close and had the sharp pain in her right shoulder.  She has decreased strength and range of motion  Review of Systems  All other systems reviewed and are negative.    Objective: Vital Signs: There were no vitals taken for this visit.  Physical Exam Constitutional:      Appearance: Normal appearance.  Pulmonary:     Effort: Pulmonary effort is normal.  Skin:    General: Skin is  warm and dry.  Neurological:     General: No focal deficit present.     Mental Status: She is alert and oriented to person, place, and time.  Psychiatric:        Mood and Affect: Mood normal.        Behavior: Behavior normal.     Ortho Exam Examination of her right shoulder she has no step-off no deformity.  She has pain with forward elevation internal rotation behind her back.  She has good abduction biceps strength.  She has positive empty can test mildly positive speeds test.  No pain with external rotation no pain with manipulation of her neck. Specialty Comments:  No specialty comments available.  Imaging: No results found.   PMFS History: Patient Active Problem List   Diagnosis Date Noted   Primary insomnia 04/04/2017   Autoimmune disease (HCC)positive ANA, positive Ro and positive La antibody. malar rash, history of oral ulcers and sicca symptoms 07/22/2016   High risk medication use 07/22/2016   History of hypothyroidism 07/22/2016   History of gastroesophageal reflux (GERD) 07/22/2016   Osteopenia of multiple sites 07/22/2016   History of depression 07/22/2016   Past Medical History:  Diagnosis Date   Abnormal Pap smear 1996   Asthma    Dyspareunia 2002   Endometriosis 2004   H/O  amenorrhea 2009   H/O candidiasis    H/O cervicitis 2002   H/O dysmenorrhea 2004   H/O tinea cruris 2002   H/O varicella    H/O vulvovaginitis 2002   H/O: menorrhagia 2003   Hx: UTI (urinary tract infection)    Hypothyroidism    Libido, decreased 2002   Menses, irregular 2004   Ovarian cyst, left 2008   PCB (post coital bleeding) 2003   Pelvic pain in female 2007    Family History  Problem Relation Age of Onset   Asthma Mother    Diabetes Mother    Depression Mother    Asthma Father    Heart disease Brother        Heart Murmur   Arthritis Maternal Grandmother    Cancer Maternal Grandmother        Breast & liver   Depression Maternal Grandmother    Heart disease  Maternal Grandfather        MI   Asthma Maternal Grandfather    Diabetes Maternal Grandfather    Kidney disease Maternal Grandfather        Dialysis    Past Surgical History:  Procedure Laterality Date   BREAST SURGERY     COLONOSCOPY  12/2019   GASTRIC     NOVASURE ABLATION     TUBAL LIGATION     UPPER GI ENDOSCOPY  12/2019   WISDOM TOOTH EXTRACTION  8008&8001   Social History   Occupational History   Not on file  Tobacco Use   Smoking status: Never    Passive exposure: Current   Smokeless tobacco: Never  Vaping Use   Vaping status: Never Used  Substance and Sexual Activity   Alcohol use: Yes    Comment: occ   Drug use: No   Sexual activity: Yes    Birth control/protection: Surgical    Comment: BTL

## 2023-10-20 ENCOUNTER — Other Ambulatory Visit: Payer: Self-pay | Admitting: Physician Assistant

## 2023-10-20 DIAGNOSIS — M359 Systemic involvement of connective tissue, unspecified: Secondary | ICD-10-CM

## 2023-10-20 NOTE — Telephone Encounter (Signed)
 Last Fill: 07/27/2023  Eye exam: 11/24/2022 WNL   Labs: 07/25/2023 CBC and CMP are normal, SSA negative, sed rate normal, complements normal, double-stranded ENA negative, urine protein creatinine ratio normal, ANA pending. ANA negative   Next Visit: 01/24/2024  Last Visit: 08/15/2023  IK:Jlunpfflwz disease (HCC)   Current Dose per office note 08/15/2023: Plaquenil  200 mg 1 tablet twice daily Monday through Friday.   Okay to refill Plaquenil ?

## 2023-11-06 ENCOUNTER — Encounter: Payer: Self-pay | Admitting: Radiology

## 2023-11-08 ENCOUNTER — Other Ambulatory Visit: Payer: Self-pay | Admitting: Physician Assistant

## 2023-11-08 DIAGNOSIS — M359 Systemic involvement of connective tissue, unspecified: Secondary | ICD-10-CM

## 2023-11-08 NOTE — Telephone Encounter (Signed)
 Last Fill: 10/20/2023  Eye exam: 11/24/2022 WNL   Labs: 07/25/2023 CBC and CMP are normal, SSA negative, sed rate normal, complements normal, double-stranded ENA negative, urine protein creatinine ratio normal, ANA negative   Next Visit: 01/24/2024  Last Visit: 08/15/2023  DX: Autoimmune disease (HCC)   Current Dose per office note 08/15/2023: Plaquenil  200 mg 1 tablet twice daily Monday through Friday.   Okay to refill Plaquenil ?

## 2023-12-22 ENCOUNTER — Encounter: Payer: Self-pay | Admitting: Rheumatology

## 2023-12-25 LAB — OPHTHALMOLOGY REPORT-SCANNED: A Comment: NORMAL

## 2024-01-11 NOTE — Progress Notes (Unsigned)
 "  Office Visit Note  Patient: [name]             Date of Birth: 11-24-1975           MRN: [id number]             PCP: Gladystine Erminio CROME, MD Referring: Gladystine Erminio CROME, MD Visit Date: 01/24/2024 Occupation: Data Unavailable  Subjective:  No chief complaint on file.   History of Present Illness: [name] is a 49 y.o. female ***     Activities of Daily Living:  Patient reports morning stiffness for *** {minute/hour:19697}.   Patient {ACTIONS;DENIES/REPORTS:21021675::Denies} nocturnal pain.  Difficulty dressing/grooming: {ACTIONS;DENIES/REPORTS:21021675::Denies} Difficulty climbing stairs: {ACTIONS;DENIES/REPORTS:21021675::Denies} Difficulty getting out of chair: {ACTIONS;DENIES/REPORTS:21021675::Denies} Difficulty using hands for taps, buttons, cutlery, and/or writing: {ACTIONS;DENIES/REPORTS:21021675::Denies}  No Rheumatology ROS completed.   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, history of oral ulcers and sicca symptoms 07/22/2016   High risk medication use 07/22/2016   History of hypothyroidism 07/22/2016   History of gastroesophageal reflux (GERD) 07/22/2016   Osteopenia of multiple sites 07/22/2016   History of depression 07/22/2016    Past Medical History:  Diagnosis Date   Abnormal Pap smear 1996   Asthma    Dyspareunia 2002   Endometriosis 2004   H/O amenorrhea 2009   H/O candidiasis    H/O cervicitis 2002   H/O dysmenorrhea 2004   H/O tinea cruris 2002   H/O varicella    H/O vulvovaginitis 2002   H/O: menorrhagia 2003   Hx: UTI (urinary tract infection)    Hypothyroidism    Libido, decreased 2002   Menses, irregular 2004   Ovarian cyst, left 2008   PCB (post coital bleeding) 2003   Pelvic pain in female 2007     Family History  Problem Relation Age of Onset   Asthma Mother    Diabetes Mother    Depression Mother    Asthma Father    Heart disease Brother        Heart Murmur   Arthritis Maternal Grandmother    Cancer Maternal Grandmother        Breast & liver   Depression Maternal Grandmother    Heart disease Maternal Grandfather        MI   Asthma Maternal Grandfather    Diabetes Maternal Grandfather    Kidney disease Maternal Grandfather        Dialysis   Past Surgical History:  Procedure Laterality Date   BREAST SURGERY     COLONOSCOPY  12/2019   GASTRIC     NOVASURE ABLATION     TUBAL LIGATION     UPPER GI ENDOSCOPY  12/2019   WISDOM TOOTH EXTRACTION  8008&8001   Social History[1] Social History   Social History Narrative   Not on file     Immunization History  Administered Date(s) Administered   Moderna Sars-Covid-2 Vaccination 09/17/2019, 10/15/2019     Objective: Vital Signs: There were no vitals taken for this visit.   Physical Exam   Musculoskeletal Exam: ***  CDAI Exam: CDAI Score: -- Patient Global: --; Provider Global: -- Swollen: --; Tender: -- Joint Exam 01/24/2024   No joint exam has been documented for this visit   There is currently no information documented on the homunculus. Go to the Rheumatology activity and  complete the homunculus joint exam.  Investigation: No additional findings.  Imaging: No results found.  Recent Labs: Lab Results  Component Value Date   WBC 5.9 07/25/2023   HGB 12.3 07/25/2023   PLT 280 07/25/2023   NA 140 07/25/2023   K 4.2 07/25/2023   CL 106 07/25/2023   CO2 27 07/25/2023   GLUCOSE 78 07/25/2023   BUN 18 07/25/2023   CREATININE 0.69 07/25/2023   BILITOT 0.2 07/25/2023   ALKPHOS 43 11/02/2020   AST 10 07/25/2023   ALT 6 07/25/2023   PROT 6.6 07/25/2023   ALBUMIN 4.0 11/02/2020   CALCIUM 8.8 07/25/2023   GFRAA 118 03/31/2020    Speciality Comments: PLQ eye exam: 12/25/2023   WNL. [facility]. Follow up in 1 year Plaquenil  started October 14, 2013  Procedures:  No procedures performed Allergies: Sulfa drugs cross reactors   Assessment / Plan:     Visit Diagnoses: No diagnosis found.  Orders: No orders of the defined types were placed in this encounter.  No orders of the defined types were placed in this encounter.   Face-to-face time spent with patient was *** minutes. Greater than 50% of time was spent in counseling and coordination of care.  Follow-Up Instructions: No follow-ups on file.   Daved JAYSON Gavel, CMA  Note - This record has been created using Animal nutritionist.  Chart creation errors have been sought, but may not always  have been located. Such creation errors do not reflect on  the standard of medical care.     [1] Social History Tobacco Use   Smoking status: Never    Passive exposure: Current   Smokeless tobacco: Never  Vaping Use   Vaping status: Never Used  Substance Use Topics   Alcohol use: Yes    Comment: occ   Drug use: No  "

## 2024-01-24 ENCOUNTER — Ambulatory Visit: Admitting: Rheumatology

## 2024-01-24 DIAGNOSIS — R5383 Other fatigue: Secondary | ICD-10-CM

## 2024-01-24 DIAGNOSIS — Z79899 Other long term (current) drug therapy: Secondary | ICD-10-CM

## 2024-01-24 DIAGNOSIS — M359 Systemic involvement of connective tissue, unspecified: Secondary | ICD-10-CM

## 2024-01-24 DIAGNOSIS — M170 Bilateral primary osteoarthritis of knee: Secondary | ICD-10-CM

## 2024-01-24 DIAGNOSIS — M8589 Other specified disorders of bone density and structure, multiple sites: Secondary | ICD-10-CM

## 2024-01-24 DIAGNOSIS — Z8639 Personal history of other endocrine, nutritional and metabolic disease: Secondary | ICD-10-CM

## 2024-01-24 DIAGNOSIS — D508 Other iron deficiency anemias: Secondary | ICD-10-CM

## 2024-01-24 DIAGNOSIS — Z8659 Personal history of other mental and behavioral disorders: Secondary | ICD-10-CM

## 2024-01-24 DIAGNOSIS — Z8719 Personal history of other diseases of the digestive system: Secondary | ICD-10-CM

## 2024-01-24 DIAGNOSIS — F5101 Primary insomnia: Secondary | ICD-10-CM

## 2024-03-12 ENCOUNTER — Ambulatory Visit: Admitting: Physician Assistant
